# Patient Record
Sex: Female | Born: 1935 | Race: White | Hispanic: No | Marital: Married | Smoking: Never smoker
Health system: Southern US, Community
[De-identification: ages and names within clinical notes are randomized; demographics above are authoritative.]

## PROBLEM LIST (undated history)

## (undated) DIAGNOSIS — N390 Urinary tract infection, site not specified: Secondary | ICD-10-CM

## (undated) DIAGNOSIS — I10 Essential (primary) hypertension: Secondary | ICD-10-CM

## (undated) DIAGNOSIS — N189 Chronic kidney disease, unspecified: Secondary | ICD-10-CM

## (undated) HISTORY — PX: CHOLECYSTECTOMY: SHX55

## (undated) HISTORY — PX: ORTHOPEDIC SURGERY: SHX850

## (undated) HISTORY — PX: ABDOMINAL HYSTERECTOMY: SHX81

---

## 2014-12-13 ENCOUNTER — Emergency Department (HOSPITAL_COMMUNITY): Payer: Medicare Other

## 2014-12-13 ENCOUNTER — Encounter (HOSPITAL_COMMUNITY): Payer: Self-pay | Admitting: *Deleted

## 2014-12-13 ENCOUNTER — Emergency Department (HOSPITAL_COMMUNITY)
Admission: EM | Admit: 2014-12-13 | Discharge: 2014-12-13 | Disposition: A | Discharge status: Home / Self Care | Payer: Medicare Other | Attending: Emergency Medicine | Admitting: Emergency Medicine

## 2014-12-13 DIAGNOSIS — W109XXA Fall (on) (from) unspecified stairs and steps, initial encounter: Secondary | ICD-10-CM | POA: Diagnosis not present

## 2014-12-13 DIAGNOSIS — Z87440 Personal history of urinary (tract) infections: Secondary | ICD-10-CM | POA: Insufficient documentation

## 2014-12-13 DIAGNOSIS — S2232XA Fracture of one rib, left side, initial encounter for closed fracture: Secondary | ICD-10-CM

## 2014-12-13 DIAGNOSIS — Y9289 Other specified places as the place of occurrence of the external cause: Secondary | ICD-10-CM | POA: Diagnosis not present

## 2014-12-13 DIAGNOSIS — W19XXXA Unspecified fall, initial encounter: Secondary | ICD-10-CM

## 2014-12-13 DIAGNOSIS — Y998 Other external cause status: Secondary | ICD-10-CM | POA: Diagnosis not present

## 2014-12-13 DIAGNOSIS — S42001A Fracture of unspecified part of right clavicle, initial encounter for closed fracture: Secondary | ICD-10-CM | POA: Insufficient documentation

## 2014-12-13 DIAGNOSIS — S42002A Fracture of unspecified part of left clavicle, initial encounter for closed fracture: Secondary | ICD-10-CM

## 2014-12-13 DIAGNOSIS — Y9389 Activity, other specified: Secondary | ICD-10-CM | POA: Diagnosis not present

## 2014-12-13 DIAGNOSIS — S0083XA Contusion of other part of head, initial encounter: Secondary | ICD-10-CM | POA: Insufficient documentation

## 2014-12-13 DIAGNOSIS — Z880 Allergy status to penicillin: Secondary | ICD-10-CM | POA: Diagnosis not present

## 2014-12-13 DIAGNOSIS — S0990XA Unspecified injury of head, initial encounter: Secondary | ICD-10-CM | POA: Diagnosis present

## 2014-12-13 DIAGNOSIS — S2242XA Multiple fractures of ribs, left side, initial encounter for closed fracture: Secondary | ICD-10-CM | POA: Diagnosis not present

## 2014-12-13 DIAGNOSIS — M25512 Pain in left shoulder: Secondary | ICD-10-CM | POA: Insufficient documentation

## 2014-12-13 DIAGNOSIS — I10 Essential (primary) hypertension: Secondary | ICD-10-CM | POA: Diagnosis not present

## 2014-12-13 DIAGNOSIS — Z791 Long term (current) use of non-steroidal anti-inflammatories (NSAID): Secondary | ICD-10-CM | POA: Insufficient documentation

## 2014-12-13 DIAGNOSIS — Z79899 Other long term (current) drug therapy: Secondary | ICD-10-CM | POA: Diagnosis not present

## 2014-12-13 DIAGNOSIS — S0093XA Contusion of unspecified part of head, initial encounter: Secondary | ICD-10-CM

## 2014-12-13 HISTORY — DX: Essential (primary) hypertension: I10

## 2014-12-13 HISTORY — DX: Urinary tract infection, site not specified: N39.0

## 2014-12-13 MED ORDER — HYDROCODONE-ACETAMINOPHEN 5-325 MG PO TABS: 1.0000 | ORAL_TABLET | ORAL | Status: DC | PRN

## 2014-12-13 MED ORDER — ONDANSETRON HCL 4 MG/2ML IJ SOLN
4.0000 mg | Freq: Once | INTRAMUSCULAR | Status: AC
  Administered 2014-12-13: 4 mg via INTRAMUSCULAR
  Filled 2014-12-13: qty 2

## 2014-12-13 MED ORDER — MORPHINE SULFATE 2 MG/ML IJ SOLN
2.0000 mg | INTRAMUSCULAR | Status: DC | PRN
  Administered 2014-12-13: 2 mg via INTRAVENOUS
  Filled 2014-12-13: qty 1

## 2014-12-13 MED ORDER — MORPHINE SULFATE 4 MG/ML IJ SOLN
4.0000 mg | Freq: Once | INTRAMUSCULAR | Status: AC
  Administered 2014-12-13: 4 mg via INTRAVENOUS
  Filled 2014-12-13: qty 1

## 2014-12-13 NOTE — ED Notes (Signed)
Patient refused sling per Ortho Tech.

## 2014-12-13 NOTE — ED Provider Notes (Signed)
CSN: [id number]     Arrival date & time 12/13/14  1643 History   First MD Initiated Contact with Patient 12/13/14 1656     Chief Complaint  Patient presents with  . Fall     (Consider location/radiation/quality/duration/timing/severity/associated sxs/prior Treatment) Patient is a 81 y.o. female presenting with fall. The history is provided by the patient. No language interpreter was used.  Fall This is a new problem. The current episode started today. The problem has been unchanged. Associated symptoms include headaches, joint swelling and myalgias. Pertinent negatives include no abdominal pain or neck pain. Nothing aggravates the symptoms. She has tried nothing for the symptoms. The treatment provided moderate relief.  Pt complains of a headache pain to her left shoulder and her left elbow.  Pt reports she did not lose conciousness.   Pt reports she had a fracture to her right shoulder several weeks ago. (family reports frequent falls, Pt has a walker and a wheelchair but refuses to use.      Past Medical History  Diagnosis Date  . Hypertension   . UTI (lower urinary tract infection)    History reviewed. No pertinent past surgical history. History reviewed. No pertinent family history. History  Substance Use Topics  . Smoking status: Not on file  . Smokeless tobacco: Not on file  . Alcohol Use: No   OB History    No data available     Review of Systems  Gastrointestinal: Negative for abdominal pain.  Musculoskeletal: Positive for myalgias and joint swelling. Negative for neck pain.  Neurological: Positive for headaches.  All other systems reviewed and are negative.     Allergies  Clinoril; Darvon; Levaquin; Penicillins; and Sulfa antibiotics  Home Medications   Prior to Admission medications   Medication Sig Start Date End Date Taking? Authorizing Provider  Cholecalciferol (VITAMIN D PO) Take 1 tablet by mouth at bedtime.   Yes Historical Provider, MD   HYDROcodone-acetaminophen (NORCO/VICODIN) 5-325 MG per tablet Take 1 tablet by mouth every 4 (four) hours as needed for moderate pain or severe pain. 12/13/14   Fransico Meadow, PA-C  ibuprofen (ADVIL,MOTRIN) 200 MG tablet Take 400 mg by mouth 2 (two) times daily.   Yes Historical Provider, MD  metoprolol succinate (TOPROL-XL) 25 MG 24 hr tablet Take 25 mg by mouth at bedtime.  10/31/14  Yes Historical Provider, MD  nitrofurantoin (MACRODANTIN) 50 MG capsule Take 50 mg by mouth at bedtime. Continuous course 12/01/14  Yes Historical Provider, MD   BP 146/67 mmHg  Pulse 83  Temp(Src) 98.1 F (36.7 C) (Oral)  Resp 16  SpO2 91% Physical Exam  Constitutional: She is oriented to person, place, and time. She appears well-developed and well-nourished.  HENT:  Head: Normocephalic and atraumatic.  Eyes: Conjunctivae and EOM are normal.  Neck: Normal range of motion.  Cardiovascular: Normal rate and normal heart sounds.   Pulmonary/Chest: Effort normal.  Abdominal: Soft. She exhibits no distension.  Musculoskeletal: Normal range of motion.  Neurological: She is alert and oriented to person, place, and time.  Skin: Skin is warm.  Psychiatric: She has a normal mood and affect.  Nursing note and vitals reviewed.   ED Course  Procedures (including critical care time) Labs Review Labs Reviewed - No data to display  Imaging Review Dg Ribs Unilateral W/chest Left  12/13/2014   CLINICAL DATA:  Initial encounter for patient fall with injury to left-sided chest. Left chest pain.  EXAM: LEFT RIBS AND CHEST - 3+ VIEW  COMPARISON:  None.  FINDINGS: Frontal chest shows hyperexpansion with underlying chronic interstitial coarsening. There is right paratracheal opacity that may be related to ectasia of large vessel anatomy or thyroid enlargement, but medial right upper lobe lung lesion cannot be excluded. There is probably some scarring in the left base although infection could have this appearance. The  cardiopericardial silhouette is within normal limits for size. Bones are diffusely demineralized.  The oblique films show fractures of the left third, fourth, fifth, and sixth ribs.  IMPRESSION: 1. Opacity in the medial right upper lung may be related to vascular anatomy or enlarged thyroid gland, but a lung lesion can't have this appearance. CT chest without contrast recommended to further evaluate. 2. Opacity at the left lung base is probably related to bronchiectasis and scarring. This could also be further assessed at the time of CT. 3. Acute fractures of the left third through sixth ribs.   Electronically Signed   By: Misty Stanley M.D.   On: 12/13/2014 20:03   Dg Elbow Complete Left  12/13/2014   CLINICAL DATA:  Acute left elbow pain after fall today.  EXAM: LEFT ELBOW - COMPLETE 3+ VIEW  COMPARISON:  None.  FINDINGS: There is no evidence of fracture, dislocation, or joint effusion. There is no evidence of arthropathy or other focal bone abnormality. Soft tissues are unremarkable.  IMPRESSION: Normal left elbow.   Electronically Signed   By: Sabino Dick M.D.   On: 12/13/2014 18:34   Ct Head Wo Contrast  12/13/2014   CLINICAL DATA:  Headache and 4 head injury after fall today.  EXAM: CT HEAD WITHOUT CONTRAST  CT CERVICAL SPINE WITHOUT CONTRAST  TECHNIQUE: Multidetector CT imaging of the head and cervical spine was performed following the standard protocol without intravenous contrast. Multiplanar CT image reconstructions of the cervical spine were also generated.  COMPARISON:  None.  FINDINGS: CT HEAD FINDINGS  Bony calvarium appears intact. Large left parietal scalp hematoma is noted. Mild diffuse cortical atrophy is noted. Mild chronic ischemic white matter disease is noted. No mass effect or midline shift is noted. Ventricular size is within normal limits. There is no evidence of mass lesion, hemorrhage or acute infarction.  CT CERVICAL SPINE FINDINGS  No significant abnormality seen in the visualized  lung apices. No fracture is noted. Grade 1 anterolisthesis of C4-5 is noted secondary to posterior facet joint hypertrophy. Moderate degenerative disc disease is noted at C5-6 and C6-7 with anterior osteophyte formation. Mild hypertrophy of posterior facet joints is noted.  IMPRESSION: Large left parietal scalp hematoma. Mild diffuse cortical atrophy. Mild chronic ischemic white matter disease. No acute intracranial abnormality seen.  Moderate degenerative disc disease is noted at C5-6 and C6-7. No acute abnormality seen in the cervical spine.   Electronically Signed   By: Sabino Dick M.D.   On: 12/13/2014 19:06   Ct Chest Wo Contrast  12/13/2014   CLINICAL DATA:  Fall to the left side. Left shoulder pain. Chest radiograph suggested possible lesion in the right upper lung.  EXAM: CT CHEST WITHOUT CONTRAST  TECHNIQUE: Multidetector CT imaging of the chest was performed following the standard protocol without IV contrast.  COMPARISON:  Chest and ribs 12/13/2014  FINDINGS: Normal heart size. Coronary artery calcifications. Normal caliber thoracic aorta with calcification. No significant lymphadenopathy in the chest. Esophagus is decompressed. The unenhanced mediastinal structures are otherwise unremarkable.  Patchy areas of infiltration with tree and bud appearance demonstrated in the right upper lung, right middle lung, left  lingula, and left lower lung. This is most consistent with bronchitis or bronchiolitis. Cylindrical bronchiectasis demonstrated in the left lower lung and left lingula with minimal bronchiectasis in the right middle lobe. Mucous plugging in the left lung base. The hazy opacity demonstrated on chest radiograph in the right upper lung corresponds to a focal area of infiltration. No lung mass is demonstrated. No pneumothorax. No pleural effusions.  Included portions of the upper abdominal organs demonstrate surgical absence of the gallbladder. Vascular calcifications. Probable cyst in the upper  pole left kidney. The degenerative changes in the thoracic spine. The wedge compression deformities of mid thoracic vertebrae. These are associated with degenerative changes in her likely old low bone no prior comparison studies are available for confirmation. No destructive bone lesions are appreciated. The old fracture deformity of the left shoulder. Acute nondisplaced fracture of the distal left clavicle. Acute fracture of the lateral left third rib. Probable fracture of the left fourth rib.  IMPRESSION: Patchy tree-in-bud infiltrates in both lungs suggesting bronchitis/ bronchiolitis. Chronic bronchiectasis the in the left lingula, left lower lobe, and right middle lobe with mucous plugging in the left base. And acute fractures of the distal left clavicle and of the left lateral third and fourth ribs. Degenerative changes in the thoracic spine with wedge compression of mid thoracic vertebrae, likely chronic. Old fracture deformity of the left shoulder.   Electronically Signed   By: Lucienne Capers M.D.   On: 12/13/2014 21:15   Ct Cervical Spine Wo Contrast  12/13/2014   CLINICAL DATA:  Headache and 4 head injury after fall today.  EXAM: CT HEAD WITHOUT CONTRAST  CT CERVICAL SPINE WITHOUT CONTRAST  TECHNIQUE: Multidetector CT imaging of the head and cervical spine was performed following the standard protocol without intravenous contrast. Multiplanar CT image reconstructions of the cervical spine were also generated.  COMPARISON:  None.  FINDINGS: CT HEAD FINDINGS  Bony calvarium appears intact. Large left parietal scalp hematoma is noted. Mild diffuse cortical atrophy is noted. Mild chronic ischemic white matter disease is noted. No mass effect or midline shift is noted. Ventricular size is within normal limits. There is no evidence of mass lesion, hemorrhage or acute infarction.  CT CERVICAL SPINE FINDINGS  No significant abnormality seen in the visualized lung apices. No fracture is noted. Grade 1  anterolisthesis of C4-5 is noted secondary to posterior facet joint hypertrophy. Moderate degenerative disc disease is noted at C5-6 and C6-7 with anterior osteophyte formation. Mild hypertrophy of posterior facet joints is noted.  IMPRESSION: Large left parietal scalp hematoma. Mild diffuse cortical atrophy. Mild chronic ischemic white matter disease. No acute intracranial abnormality seen.  Moderate degenerative disc disease is noted at C5-6 and C6-7. No acute abnormality seen in the cervical spine.   Electronically Signed   By: Sabino Dick M.D.   On: 12/13/2014 19:06   Dg Shoulder Left  12/13/2014   CLINICAL DATA:  Initial encounter for fall earlier today with left shoulder pain.  EXAM: LEFT SHOULDER - 2+ VIEW  COMPARISON:  None.  FINDINGS: Two view exam of the left shoulder was performed. Bones are diffusely demineralized. Deformity of the left humeral head may be related to previous trauma. No acute fracture is evident. Acromioclavicular cortical clavicular distance is preserved.  There is an acute fracture of the lateral left third rib.  IMPRESSION: Left third rib fracture appears acute.  No evidence for acute shoulder abnormality.   Electronically Signed   By: Verda Cumins.D.  On: 12/13/2014 18:33     EKG Interpretation None      MDM  Pt able to ambulate with a walker and with her cane.  Pt advised to use walker.  Pt refuses sling for fracture.  Pt reports she wore one before and it makes it worse   Final diagnoses:  Fall  Clavicle fracture, left, closed, initial encounter  Rib fractures, left, closed, initial encounter  Contusion of head, initial encounter    Pt advised to see her MD for recheck Pt is visiting here from out of town.  Hydrocodone one every 4-6 when resting, do not take and walk.     Hollace Kinnier [place], PA-C 12/14/14 0009  Dot Lanes, MD 12/18/14 [phone]

## 2014-12-13 NOTE — Progress Notes (Signed)
Orthopedic Tech Progress Note Patient Details:  Shannon Joyce 12-Oct-1925 [id number]  Ortho Devices Type of Ortho Device: Arm sling Ortho Device/Splint Interventions: Application   [name] 12/13/2014, 11:30 PM

## 2014-12-13 NOTE — ED Notes (Signed)
Pt returned to room  

## 2014-12-13 NOTE — ED Notes (Signed)
Pt. Still at scans

## 2014-12-13 NOTE — Discharge Instructions (Signed)
Acromioclavicular Injuries °The AC (acromioclavicular) joint is the joint in the shoulder where the collarbone (clavicle) meets the shoulder blade (scapula). The part of the shoulder blade connected to the collarbone is called the acromion. Common problems with and treatments for the AC joint are detailed below. °ARTHRITIS °Arthritis occurs when the joint has been injured and the smooth padding between the joints (cartilage) is lost. This is the wear and tear seen in most joints of the body if they have been overused. This causes the joint to produce pain and swelling which is worse with activity.  °AC JOINT SEPARATION °AC joint separation means that the ligaments connecting the acromion of the shoulder blade and collarbone have been damaged, and the two bones no longer line up. AC separations can be anywhere from mild to severe, and are "graded" depending upon which ligaments are torn and how badly they are torn. °· Grade I Injury: the least damage is done, and the AC joint still lines up. °· Grade II Injury: damage to the ligaments which reinforce the AC joint. In a Grade II injury, these ligaments are stretched but not entirely torn. When stressed, the AC joint becomes painful and unstable. °· Grade III Injury: AC and secondary ligaments are completely torn, and the collarbone is no longer attached to the shoulder blade. This results in deformity; a prominence of the end of the clavicle. °AC JOINT FRACTURE °AC joint fracture means that there has been a break in the bones of the AC joint, usually the end of the clavicle. °TREATMENT °TREATMENT OF AC ARTHRITIS °· There is currently no way to replace the cartilage damaged by arthritis. The best way to improve the condition is to decrease the activities which aggravate the problem. Application of ice to the joint helps decrease pain and soreness (inflammation). The use of non-steroidal anti-inflammatory medication is helpful. °· If less conservative measures do not  work, then cortisone shots (injections) may be used. These are anti-inflammatories; they decrease the soreness in the joint and swelling. °· If non-surgical measures fail, surgery may be recommended. The procedure is generally removal of a portion of the end of the clavicle. This is the part of the collarbone closest to your acromion which is stabilized with ligaments to the acromion of the shoulder blade. This surgery may be performed using a tube-like instrument with a light (arthroscope) for looking into a joint. It may also be performed as an open surgery through a small incision by the surgeon. Most patients will have good range of motion within 6 weeks and may return to all activity including sports by 8-12 weeks, barring complications. °TREATMENT OF AN AC SEPARATION °· The initial treatment is to decrease pain. This is best accomplished by immobilizing the arm in a sling and placing an ice pack to the shoulder for 20 to 30 minutes every 2 hours as needed. As the pain starts to subside, it is important to begin moving the fingers, wrist, elbow and eventually the shoulder in order to prevent a stiff or "frozen" shoulder. Instruction on when and how much to move the shoulder will be provided by your caregiver. The length of time needed to regain full motion and function depends on the amount or grade of the injury. Recovery from a Grade I AC separation usually takes 10 to 14 days, whereas a Grade III may take 6 to 8 weeks. °· Grade I and II separations usually do not require surgery. Even Grade III injuries usually allow return to full   activity with few restrictions. Treatment is also based on the activity demands of the injured shoulder. For example, a high level quarterback with an injured throwing arm will receive more aggressive treatment than someone with a desk job who rarely uses his/her arm for strenuous activities. In some cases, a painful lump may persist which could require a later surgery. Surgery  can be very successful, but the benefits must be weighed against the potential risks. TREATMENT OF AN AC JOINT FRACTURE Fracture treatment depends on the type of fracture. Sometimes a splint or sling may be all that is required. Other times surgery may be required for repair. This is more frequently the case when the ligaments supporting the clavicle are completely torn. Your caregiver will help you with these decisions and together you can decide what will be the best treatment. HOME CARE INSTRUCTIONS   Apply ice to the injury for 15-20 minutes each hour while awake for 2 days. Put the ice in a plastic bag and place a towel between the bag of ice and skin.  If a sling has been applied, wear it constantly for as long as directed by your caregiver, even at night. The sling or splint can be removed for bathing or showering or as directed. Be sure to keep the shoulder in the same place as when the sling is on. Do not lift the arm.  If a figure-of-eight splint has been applied it should be tightened gently by another person every day. Tighten it enough to keep the shoulders held back. Allow enough room to place the index finger between the body and strap. Loosen the splint immediately if there is numbness or tingling in the hands.  Take over-the-counter or prescription medicines for pain, discomfort or fever as directed by your caregiver.  If you or your child has received a follow up appointment, it is very important to keep that appointment in order to avoid long term complications, chronic pain or disability. SEEK MEDICAL CARE IF:   The pain is not relieved with medications.  There is increased swelling or discoloration that continues to get worse rather than better.  You or your child has been unable to follow up as instructed.  There is progressive numbness and tingling in the arm, forearm or hand. SEEK IMMEDIATE MEDICAL CARE IF:   The arm is numb, cold or pale.  There is increasing pain  in the hand, forearm or fingers. MAKE SURE YOU:   Understand these instructions.  Will watch your condition.  Will get help right away if you are not doing well or get worse. Document Released: 09/07/2005 Document Revised: 02/20/2012 Document Reviewed: 03/02/2009 [facility] Patient Information 2015 [place], [place]. This information is not intended to replace advice given to you by your health care provider. Make sure you discuss any questions you have with your health care provider. Rib Fracture A rib fracture is a break or crack in one of the bones of the ribs. The ribs are a group of long, curved bones that wrap around your chest and attach to your spine. They protect your lungs and other organs in the chest cavity. A broken or cracked rib is often painful, but most do not cause other problems. Most rib fractures heal on their own over time. However, rib fractures can be more serious if multiple ribs are broken or if broken ribs move out of place and push against other structures. CAUSES   A direct blow to the chest. For example, this could happen  during contact sports, a car accident, or a fall against a hard object.  Repetitive movements with high force, such as pitching a baseball or having severe coughing spells. SYMPTOMS   Pain when you breathe in or cough.  Pain when someone presses on the injured area. DIAGNOSIS  Your caregiver will perform a physical exam. Various imaging tests may be ordered to confirm the diagnosis and to look for related injuries. These tests may include a chest X-ray, computed tomography (CT), magnetic resonance imaging (MRI), or a bone scan. TREATMENT  Rib fractures usually heal on their own in 1-3 months. The longer healing period is often associated with a continued cough or other aggravating activities. During the healing period, pain control is very important. Medication is usually given to control pain. Hospitalization or surgery may be needed for more  severe injuries, such as those in which multiple ribs are broken or the ribs have moved out of place.  HOME CARE INSTRUCTIONS   Avoid strenuous activity and any activities or movements that cause pain. Be careful during activities and avoid bumping the injured rib.  Gradually increase activity as directed by your caregiver.  Only take over-the-counter or prescription medications as directed by your caregiver. Do not take other medications without asking your caregiver first.  Apply ice to the injured area for the first 1-2 days after you have been treated or as directed by your caregiver. Applying ice helps to reduce inflammation and pain.  Put ice in a plastic bag.  Place a towel between your skin and the bag.   Leave the ice on for 15-20 minutes at a time, every 2 hours while you are awake.  Perform deep breathing as directed by your caregiver. This will help prevent pneumonia, which is a common complication of a broken rib. Your caregiver may instruct you to:  Take deep breaths several times a day.  Try to cough several times a day, holding a pillow against the injured area.  Use a device called an incentive spirometer to practice deep breathing several times a day.  Drink enough fluids to keep your urine clear or pale yellow. This will help you avoid constipation.   Do not wear a rib belt or binder. These restrict breathing, which can lead to pneumonia.  SEEK IMMEDIATE MEDICAL CARE IF:   You have a fever.   You have difficulty breathing or shortness of breath.   You develop a continual cough, or you cough up thick or bloody sputum.  You feel sick to your stomach (nausea), throw up (vomit), or have abdominal pain.   You have worsening pain not controlled with medications.  MAKE SURE YOU:  Understand these instructions.  Will watch your condition.  Will get help right away if you are not doing well or get worse. Document Released: 11/28/2005 Document Revised:  07/31/2013 Document Reviewed: 01/30/2013 [facility] Patient Information 2015 [place], [place]. This information is not intended to replace advice given to you by your health care provider. Make sure you discuss any questions you have with your health care provider.

## 2014-12-13 NOTE — ED Notes (Signed)
Pt reports falling approx 1 hour ago, fell down 2 steps and hit her head, unsure if loc occurred. Pt has large hematoma to left side of head and reports pain to bilateral shoulders. Has hx of left clavicle fx 3 months ago.

## 2018-06-18 ENCOUNTER — Encounter: Payer: Self-pay | Admitting: Internal Medicine

## 2018-06-18 ENCOUNTER — Non-Acute Institutional Stay: Payer: Medicare Other | Admitting: Internal Medicine

## 2018-06-18 VITALS — BP 158/80 | HR 91 | Resp 20

## 2018-06-18 DIAGNOSIS — R531 Weakness: Secondary | ICD-10-CM

## 2018-06-18 DIAGNOSIS — Z515 Encounter for palliative care: Secondary | ICD-10-CM

## 2018-06-18 DIAGNOSIS — R2689 Other abnormalities of gait and mobility: Secondary | ICD-10-CM

## 2018-06-18 NOTE — Progress Notes (Signed)
   06/18/2018  PALLIATIVE CARE ENCOUNTER Shannon Joyce. DOB: 14-May-1925 . [facility] Room 8-B REFERRING PROVIDER:  Jaynee Joyce  FAMILY: two sons Shannon Joyce [facility]), Shannon Joyce [phone]; patient believes Shannon Joyce is [place].Marland Kitchen  BILLABLE  ICD-10: Palliative Care   IMPRESSION/RECOMMENDATIONS: 1. Mobility disorder: -Mostly wheelchair bound; maneuvers about quite well in her wheelchair; able to sit up and transfer independently. Can propell self about by tuning the wheels or using her feet. Physical Therapy ambulates with patient (with walker) 1/2 down hallway a few times month.  2. Pain: -Notes pain left knee with weight bearing; favors right leg because of thigh pain. Feels well managed with MPAP 650mg  tid.    3. Weight loss:  -In Jan 2019 serum Alb 2.8. Fair appetite. Consumes 25-50% of three meals a day; Ensure 1-2 cans/day.  Weight: 98 lbs, which is a loss of 8 lbs over 7 months (which is a loss of 7.5% of body mass). Ht 5'" BMI 19.1 kg/m2. On Remeron 7.5mg  for appetite enhancement.  3. Advanced Care Planning: Staff report that patient is a DNR.  I spent 45 minutes providing this consultation (from 1:45pm to 2:30pm). More than 50% of that time was spent coordinating communication.  HPI: 84 yo female resident of [facility], referred to [facility] for assistance with symptom management, help with coordination of resources, and ongoing discussions regarding goals of care/advanced directives.  CODE STATUS: DNR per staff report. PPS: 40%. HOSPICE ELIGIBILITY: No  MEDICATIONS :   Maalox 20mg  q4h prn, Tums 2 tabs qid, Voltaren gel bid prn, MTV qd, furosemide 20mg  qd, Toprol-XL 25 mg qd, Senna S 8.6-50 mg qd, trimethoprim (Trimpex) 50mg  qd,  MTV. prilosec 20mg  qd, MAPAP 650 tid,  Ensure tid, mirtazapine 7.5mg  qd ALLERGIES: Clinorl, Darvon, Levaquin, penicilllins, sulfa antibiotics.  PAST MEDICAL HISTORY: Sepsis r/t UTI (ESBL), AKI on CKD 3b (12/2017: BUN 35,  Creat: 1.51),  GERD, HTN, recurrent UTI, nephrolithiasis, confusion setting of UTI, femur fracture (earlier this year), sacral pressure injury 01/04-01/09 Hosp admission @ [facility]; treated for sepsis r/t UTI.  SH/H: Husband of 44 years deceased earlier this year; lost her daughter 5 years ago.  PHYSICAL EXAM: Elderly female who was able to self transport from bed to wheelchair to recliner, then to bathroom. Alert and pleasantly conversant.  VS: BP: 158/80, HRL 91, Sats 95% (room air) RR 20 HEENT: [place], AT LUNGS:  Mild congested cough (recent onset), LSCTA CARDIAC: RRR without MRG ABD: soft, non-distended, non-tender, NABS EXTREMITIES: No pedal edema MUSCULOSKELETAL: No contractures SKIN: Skin warm, dry and intact NEURO: A & O x 3.  Violeta Gelinas NP-C

## 2018-09-10 ENCOUNTER — Non-Acute Institutional Stay: Payer: Medicare Other | Admitting: Nurse Practitioner

## 2018-09-10 ENCOUNTER — Encounter: Payer: Self-pay | Admitting: Nurse Practitioner

## 2018-09-10 VITALS — BP 140/72 | HR 72 | Resp 18 | Wt 103.0 lb

## 2018-09-10 DIAGNOSIS — G894 Chronic pain syndrome: Secondary | ICD-10-CM

## 2018-09-10 DIAGNOSIS — R269 Unspecified abnormalities of gait and mobility: Secondary | ICD-10-CM

## 2018-09-10 DIAGNOSIS — R634 Abnormal weight loss: Secondary | ICD-10-CM

## 2018-09-10 DIAGNOSIS — Z515 Encounter for palliative care: Secondary | ICD-10-CM

## 2018-09-10 NOTE — Progress Notes (Signed)
    PALLIATIVE CARE CONSULT VISIT   PATIENT NAME: Shannon Joyce DOB: September 08, 1925 MRN: [id number]  PRIMARY CARE PROVIDER:   No primary care provider on file.  REFERRING PROVIDER:  No referring provider defined for this encounter.  RESPONSIBLE PARTY:   Roselee Culver (spouse)    HISTORY OF PRESENT ILLNESS:  Shannon Joyce is a 84 y.o. year old female with multiple medical problems including weight loss, HTN, right femur fracture (01/19), chronic pain, HTN, GERD and recurrent UTI's. Palliative Care was asked to help with symptom management and to  address goals of care    Problem/RECOMMENDATIONS and PLAN:  Weight loss (improved) -weight now 103lbs (98lb);patient reports good apetite -continue mirtazapine 7.5mg  qd -continue ensure tid  Chronic medical problems H/o Rt. femur fracture Chronic pain Gait disorder HTN GERD Recurrent UTI's  -Managed by primary care provider -denies any current pain -PRN norco 5/325 Q4hrs -ibuprofen 400mg  BID -patient able to transfer herself from bed to wheelchair -metoprolol 25mg  Qhs -nitrofurantioin 50mg  at bedtime  ACP -DNR   I spent 30 minutes providing this consultation,  from 14:00 to 14:30. More than 50% of the time in this consultation was spent coordinating communication.   . CODE STATUS: DNR  PPS: 40% HOSPICE ELIGIBILITY/DIAGNOSIS: TBD  PAST MEDICAL HISTORY:  Past Medical History:  Diagnosis Date  . Hypertension   . UTI (lower urinary tract infection)     SOCIAL HX:  Social History   Tobacco Use  . Smoking status: Not on file  Substance Use Topics  . Alcohol use: No    ALLERGIES:  Allergies  Allergen Reactions  . Clinoril [Sulindac] Other (See Comments)    Hives or swelling  . Darvon [Propoxyphene] Other (See Comments)    Hives or swelling  . Levaquin [Levofloxacin In D5w] Swelling  . Penicillins Other (See Comments)    Hives or swelling  . Sulfa Antibiotics Other (See Comments)    Hives or swelling     PERTINENT  MEDICATIONS:  Outpatient Encounter Medications as of 09/10/2018  Medication Sig  . Cholecalciferol (VITAMIN D PO) Take 1 tablet by mouth at bedtime.  Marland Kitchen HYDROcodone-acetaminophen (NORCO/VICODIN) 5-325 MG per tablet Take 1 tablet by mouth every 4 (four) hours as needed for moderate pain or severe pain.  Marland Kitchen ibuprofen (ADVIL,MOTRIN) 200 MG tablet Take 400 mg by mouth 2 (two) times daily.  . metoprolol succinate (TOPROL-XL) 25 MG 24 hr tablet Take 25 mg by mouth at bedtime.   . nitrofurantoin (MACRODANTIN) 50 MG capsule Take 50 mg by mouth at bedtime. Continuous course   No facility-administered encounter medications on file as of 09/10/2018.    ROS: denies fever,chills, cough,shortness of breath, chest pain, abdominal pain, denies knee pain says PRN pain meds are effective, denies dysuria, occasional constipation; reports generalized fatigue (that is chronic)  PHYSICAL EXAM:   General: NAD, frail appearing, thin Cardiovascular: regular rate and rhythm Pulmonary: clear ant fields Abdomen: soft, nontender, + bowel sounds GU: no suprapubic tenderness Extremities: no edema, no joint deformities Skin: no rashes Neurological: Weakness but otherwise nonfocal  Stephanie G Martinique, NP

## 2018-11-19 ENCOUNTER — Non-Acute Institutional Stay: Payer: Medicare Other | Admitting: Nurse Practitioner

## 2018-11-19 DIAGNOSIS — G894 Chronic pain syndrome: Secondary | ICD-10-CM

## 2018-11-19 DIAGNOSIS — F0390 Unspecified dementia without behavioral disturbance: Secondary | ICD-10-CM

## 2018-11-19 DIAGNOSIS — Z515 Encounter for palliative care: Secondary | ICD-10-CM

## 2018-11-19 DIAGNOSIS — R531 Weakness: Secondary | ICD-10-CM

## 2018-11-19 DIAGNOSIS — R269 Unspecified abnormalities of gait and mobility: Secondary | ICD-10-CM

## 2018-11-20 ENCOUNTER — Encounter: Payer: Self-pay | Admitting: Nurse Practitioner

## 2018-11-20 NOTE — Progress Notes (Signed)
    PALLIATIVE CARE CONSULT VISIT   PATIENT NAME: Shannon Joyce DOB: 03-Jun-1925 MRN: [id number]  PRIMARY CARE PROVIDER:   Patient, No Pcp Per  REFERRING PROVIDER:  No referring provider defined for this encounter.  RESPONSIBLE PARTY:   Roselee Culver (spouse)    HISTORY OF PRESENT ILLNESS:  Shannon Joyce is a 84 y.o. year old female with multiple medical problems including weight loss, HTN, right femur fracture (01/19), chronic pain, HTN, GERD and recurrent UTI's. Palliative Care was asked to help with symptom management and to  address goals of care  Interim history: patient maintaining weight; patient denies any pain or discomfort    Problem/RECOMMENDATIONS and PLAN:  Weight loss (improved) -patient reports good appetite -continue mirtazapine 7.5mg  qd -continue ensure tid  Chronic medical problems H/o Rt. femur fracture Chronic pain Gait disorder HTN GERD Recurrent UTI's  -Managed by primary care provider -denies any current pain -PRN norco 5/325 Q4hrs -ibuprofen 400mg  BID -patient able to transfer herself from bed to wheelchair -metoprolol 25mg  Qhs -nitrofurantioin 50mg  at bedtime  ACP -DNR   I spent 15 minutes providing this consultation,  from 14:30 to 14:45. More than 50% of the time in this consultation was spent coordinating communication.   CODE STATUS: DNR  PPS: 40% HOSPICE ELIGIBILITY/DIAGNOSIS: TBD  PAST MEDICAL HISTORY:  Past Medical History:  Diagnosis Date  . Hypertension   . UTI (lower urinary tract infection)     SOCIAL HX:  Social History   Tobacco Use  . Smoking status: Never Smoker  . Smokeless tobacco: Never Used  Substance Use Topics  . Alcohol use: No    ALLERGIES:  Allergies  Allergen Reactions  . Clinoril [Sulindac] Other (See Comments)    Hives or swelling  . Darvon [Propoxyphene] Other (See Comments)    Hives or swelling  . Levaquin [Levofloxacin In D5w] Swelling  . Penicillins Other (See Comments)    Hives or swelling  .  Sulfa Antibiotics Other (See Comments)    Hives or swelling     PERTINENT MEDICATIONS:  Outpatient Encounter Medications as of 11/19/2018  Medication Sig  . Cholecalciferol (VITAMIN D PO) Take 1 tablet by mouth at bedtime.  Marland Kitchen HYDROcodone-acetaminophen (NORCO/VICODIN) 5-325 MG per tablet Take 1 tablet by mouth every 4 (four) hours as needed for moderate pain or severe pain.  Marland Kitchen ibuprofen (ADVIL,MOTRIN) 200 MG tablet Take 400 mg by mouth 2 (two) times daily.  . metoprolol succinate (TOPROL-XL) 25 MG 24 hr tablet Take 25 mg by mouth at bedtime.   . nitrofurantoin (MACRODANTIN) 50 MG capsule Take 50 mg by mouth at bedtime. Continuous course   No facility-administered encounter medications on file as of 11/19/2018.    ROS: denies fever,chills, cough,shortness of breath, chest pain, abdominal pain, denies knee pain says PRN pain meds are effective, denies dysuria, occasional constipation; reports generalized fatigue (that is chronic)  PHYSICAL EXAM:   General: NAD, well-developed, well-nourished Cardiovascular: regular rate and rhythm Pulmonary: clear ant fields Abdomen: soft, nontender, + bowel sounds GU: no suprapubic tenderness Extremities: no edema, no joint deformities Skin: no rashes Neurological: Weakness but otherwise nonfocal  Domani Bakos G Martinique, NP

## 2019-02-08 ENCOUNTER — Telehealth: Payer: Self-pay | Admitting: Adult Health Nurse Practitioner

## 2019-02-08 ENCOUNTER — Non-Acute Institutional Stay: Payer: Medicare Other | Admitting: Adult Health Nurse Practitioner

## 2019-02-08 DIAGNOSIS — Z515 Encounter for palliative care: Secondary | ICD-10-CM

## 2019-02-08 NOTE — Progress Notes (Signed)
    [facility] Consult Note Telephone: [phone]  Fax: [phone]  PATIENT NAME: Shannon Joyce DOB: Sep 10, 1925 MRN: [id number]  PRIMARY CARE PROVIDER:   Patient, No Pcp Per  REFERRING PROVIDER:  No referring provider defined for this encounter.  RESPONSIBLE PARTY:   Muntaha Vermette (spouse) [phone]       RECOMMENDATIONS and PLAN:  1.  Weight loss.  Patient's weight is stable and staff reports that she does not eat much at a time but she does eat and staff offers her snacks, such as pudding, between meals.  Continue remeron 7.5 mg QHS to help with appetite and maintain weight  2. Pain.  Patient states that her right leg that was fractured will hurt at times but present pain meds help relieve the pain when she needs it.  Patient is able to transfer herself from  Bed to wheelchair and go to the bathroom on her own.  She is able to dress and bathe herself.  Denies pain today  3. Advanced care planning.  DNR remains in place  I spent 15 minutes providing this consultation,  from 9:30 to 9:45 More than 50% of the time in this consultation was spent coordinating communication.   HISTORY OF PRESENT ILLNESS: Shannon Joyce is a 85 y.o. year old female with multiple medical problems including weight loss, HTN, right femur fracture (01/19), chronic pain, HTN, GERD and recurrent UTI's. Palliative Care was asked to help with symptom management and to  address goals of care. Patient is stable with stable weight, well controlled pain.  She has not had any falls, hospital visits, or infections since last visit.  CODE STATUS: DNR  PPS: 40% HOSPICE ELIGIBILITY/DIAGNOSIS: TBD  PHYSICAL EXAM:   General: elderly female sitting up in recliner in NAD Cardiovascular: regular rate and rhythm Pulmonary: clear ant fields Abdomen: soft, nontender, + bowel sounds GU: no suprapubic tenderness Extremities: no edema, no joint deformities Skin: no  rashes Neurological: Weakness but otherwise nonfocal   PAST MEDICAL HISTORY:  Past Medical History:  Diagnosis Date  . Hypertension   . UTI (lower urinary tract infection)     SOCIAL HX:  Social History   Tobacco Use  . Smoking status: Never Smoker  . Smokeless tobacco: Never Used  Substance Use Topics  . Alcohol use: No    ALLERGIES:  Allergies  Allergen Reactions  . Clinoril [Sulindac] Other (See Comments)    Hives or swelling  . Darvon [Propoxyphene] Other (See Comments)    Hives or swelling  . Levaquin [Levofloxacin In D5w] Swelling  . Penicillins Other (See Comments)    Hives or swelling  . Sulfa Antibiotics Other (See Comments)    Hives or swelling     PERTINENT MEDICATIONS:  Outpatient Encounter Medications as of 02/08/2019  Medication Sig  . Cholecalciferol (VITAMIN D PO) Take 1 tablet by mouth at bedtime.  Marland Kitchen HYDROcodone-acetaminophen (NORCO/VICODIN) 5-325 MG per tablet Take 1 tablet by mouth every 4 (four) hours as needed for moderate pain or severe pain.  Marland Kitchen ibuprofen (ADVIL,MOTRIN) 200 MG tablet Take 400 mg by mouth 2 (two) times daily.  . metoprolol succinate (TOPROL-XL) 25 MG 24 hr tablet Take 25 mg by mouth at bedtime.   . nitrofurantoin (MACRODANTIN) 50 MG capsule Take 50 mg by mouth at bedtime. Continuous course   No facility-administered encounter medications on file as of 02/08/2019.      Diar Berkel Jenetta Downer, NP

## 2019-02-08 NOTE — Telephone Encounter (Signed)
Tried calling spouse to update on patient status, which is stable.  Unable to leave message because of busy  [employer].  Will try to call back later Fitz Matsuo K. Olena Heckle NP

## 2019-07-03 ENCOUNTER — Other Ambulatory Visit: Payer: Self-pay

## 2019-07-03 ENCOUNTER — Non-Acute Institutional Stay: Payer: Medicare Other | Admitting: Adult Health Nurse Practitioner

## 2019-07-03 DIAGNOSIS — Z515 Encounter for palliative care: Secondary | ICD-10-CM

## 2019-07-03 NOTE — Progress Notes (Signed)
    [facility] Consult Note Telephone: [phone]  Fax: [phone]  PATIENT NAME: Kadin Bera DOB: 1925-04-11 MRN: [id number]  PRIMARY CARE PROVIDER:   Patient, No Pcp Per  REFERRING PROVIDER:  No referring provider defined for this encounter.  RESPONSIBLE PARTY:   Manuela Halbur (spouse) [phone]         RECOMMENDATIONS and PLAN:  1. Weight.  Patient's appetite is the same.  States that she does not eat much at a time but she does eat.  No reported weight changes. Continue current POC.  2.  Pain.  Patient states that she will have pain in her shoulders and right leg but this is relieved with current pain meds.  Able to transfer herself from bed to wheelchair and go to bathroom.  Does need minimal assistance with dressing and bathing but can mostly do it herself.  Denies any pain at this time  3. Goals of care. DNR in place.   No reported falls, hospitalizations, or infections since last visit.  Continue supportive care  I spent 20 minutes providing this consultation,  from 10:20 to 10:40. More than 50% of the time in this consultation was spent coordinating communication.   HISTORY OF PRESENT ILLNESS:  Aylin Rhoads is a 85 y.o. year old female with multiple medical problems including weight loss, HTN, right femur fracture (01/19), chronic pain, HTN, GERD and recurrent UTI's. Palliative Care was asked to help address goals of care.   CODE STATUS: DNR  PPS: 40% HOSPICE ELIGIBILITY/DIAGNOSIS: TBD  PHYSICAL EXAM:   General: patient sitting in recliner in NAD Cardiovascular: regular rate and rhythm Pulmonary: lung sounds clear; normal respiratory effort Abdomen: soft, nontender, + bowel sounds GU: no suprapubic tenderness Extremities: no edema, no joint deformities Skin: no rashes Neurological: Weakness but otherwise nonfocal   PAST MEDICAL HISTORY:  Past Medical History:  Diagnosis Date  . Hypertension   . UTI (lower urinary  tract infection)     SOCIAL HX:  Social History   Tobacco Use  . Smoking status: Never Smoker  . Smokeless tobacco: Never Used  Substance Use Topics  . Alcohol use: No    ALLERGIES:  Allergies  Allergen Reactions  . Clinoril [Sulindac] Other (See Comments)    Hives or swelling  . Darvon [Propoxyphene] Other (See Comments)    Hives or swelling  . Levaquin [Levofloxacin In D5w] Swelling  . Penicillins Other (See Comments)    Hives or swelling  . Sulfa Antibiotics Other (See Comments)    Hives or swelling     PERTINENT MEDICATIONS:  Outpatient Encounter Medications as of 83/22/2020  Medication Sig  . Cholecalciferol (VITAMIN D PO) Take 1 tablet by mouth at bedtime.  Marland Kitchen HYDROcodone-acetaminophen (NORCO/VICODIN) 5-325 MG per tablet Take 1 tablet by mouth every 4 (four) hours as needed for moderate pain or severe pain.  Marland Kitchen ibuprofen (ADVIL,MOTRIN) 200 MG tablet Take 400 mg by mouth 2 (two) times daily.  . metoprolol succinate (TOPROL-XL) 25 MG 24 hr tablet Take 25 mg by mouth at bedtime.   . nitrofurantoin (MACRODANTIN) 50 MG capsule Take 50 mg by mouth at bedtime. Continuous course   No facility-administered encounter medications on file as of 07/03/2019.      Israel Werts Jenetta Downer, NP

## 2019-08-30 ENCOUNTER — Non-Acute Institutional Stay: Payer: Medicare Other | Admitting: Hospice

## 2019-08-30 ENCOUNTER — Other Ambulatory Visit: Payer: Self-pay

## 2019-08-30 DIAGNOSIS — Z515 Encounter for palliative care: Secondary | ICD-10-CM

## 2019-08-30 NOTE — Progress Notes (Signed)
[facility] Consult Note Telephone: [phone]  Fax: [phone]   PATIENT NAME: Shannon Joyce DOB: 02-Aug-1925 MRN: [id number]  PRIMARY CARE PROVIDER:   Patient, No Pcp Per   REFERRING PROVIDER:  No referring provider defined for this encounter. N/A  RESPONSIBLE PARTY:   Extended Emergency Contact Information Primary Emergency Contact: Toves,Calvin Address: [address], [place] [address] [place] Phone: [phone] Relation: Spouse Secondary Emergency Contact: Armstrong,Claude  [place] Phone: [phone] Relation: Son   ASSESSMENT AND RECOMMENDATIONS:   1. Advance Care Planning/Goals of Care: Goals include to maximize quality of life and symptom management. DNR and MOST form in chart, up to date. Will upload into [facility] once access granted  2. Symptom Management: Currently on physical therapy for strengthening and alleviation of complaint of pain right leg. Patient denied pain/discomfort during visit; said she has pain medication when she needs and it is effective.  PT on site reported patient making progress,  able to ambulate 150 feet with rolling walker; she continues to prefer to self propel in wheelchair. Encourage activity, use pain med as ordered.   5. Follow up Palliative Care Visit: Palliative care will continue to follow for goals of care clarification and symptom management. Return 4-6 weeks or prn.  I spent 50 minutes providing this consultation,  from 12.00pm to 12.50. More than 50% of the time in this consultation was spent  In consultation with patient, clinical staff, chart review and coordinating communication.   HISTORY OF PRESENT ILLNESS:  Shannon Joyce is a 85 y.o. year old female with multiple medical problems including HTN, right femur fracture (01/19), chronic pain, HTN, GERD and recurrent UTI's. Palliative Care was asked to help address goals of care. This  is a follow up visit.  CODE STATUS: DNR  PPS: 40% HOSPICE ELIGIBILITY/DIAGNOSIS: TBD  PAST MEDICAL HISTORY:  Past Medical History:  Diagnosis Date   Hypertension    UTI (lower urinary tract infection)     SOCIAL HX:  Social History   Tobacco Use   Smoking status: Never Smoker   Smokeless tobacco: Never Used  Substance Use Topics   Alcohol use: No    ALLERGIES:  Allergies  Allergen Reactions   Clinoril [Sulindac] Other (See Comments)    Hives or swelling   Darvon [Propoxyphene] Other (See Comments)    Hives or swelling   Levaquin [Levofloxacin In D5w] Swelling   Penicillins Other (See Comments)    Hives or swelling   Sulfa Antibiotics Other (See Comments)    Hives or swelling     PERTINENT MEDICATIONS:  Outpatient Encounter Medications as of 08/30/2019  Medication Sig   Cholecalciferol (VITAMIN D PO) Take 1 tablet by mouth at bedtime.   HYDROcodone-acetaminophen (NORCO/VICODIN) 5-325 MG per tablet Take 1 tablet by mouth every 4 (four) hours as needed for moderate pain or severe pain.   ibuprofen (ADVIL,MOTRIN) 200 MG tablet Take 400 mg by mouth 2 (two) times daily.   metoprolol succinate (TOPROL-XL) 25 MG 24 hr tablet Take 25 mg by mouth at bedtime.    nitrofurantoin (MACRODANTIN) 50 MG capsule Take 50 mg by mouth at bedtime. Continuous course   No facility-administered encounter medications on file as of 08/30/2019.     PHYSICAL EXAM / ROS:  General: NAD, cooperative Cardiovascular: no chest pain reported; regular rate and rhythm  Pulmonary: no cough, SOB MSK:  no  joint deformities, ambulatory Skin: no rashes or wounds on exposed skin Neurological: Weakness, alert and oriented x 3  Teodoro Spray, NP

## 2019-10-18 ENCOUNTER — Other Ambulatory Visit: Payer: Self-pay

## 2019-10-18 ENCOUNTER — Non-Acute Institutional Stay: Payer: Medicare Other | Admitting: Hospice

## 2019-10-18 DIAGNOSIS — Z515 Encounter for palliative care: Secondary | ICD-10-CM

## 2019-10-18 NOTE — Progress Notes (Signed)
    [facility] Consult Note Telephone: [phone]  Fax: [phone]  PATIENT NAME: Shannon Joyce DOB: 1925/01/05 MRN: [id number]  PRIMARY CARE PROVIDER:   Patient, No Pcp Per  REFERRING PROVIDER:  Jaynee Eagles  RESPONSIBLE PARTY:   Extended Emergency Contact Information Primary Emergency Contact: Morton,Calvin Address: [address] [place], [place] [address] [place] Phone: [phone] Relation: Spouse Secondary Emergency Contact: Landry,Claude  [place] Phone: [phone] Relation: Son ASSESSMENT AND RECOMMENDATIONS:   1. Advance Care Planning/Goals of Care: Patient remains a resident at [facility] with goals including to maximize quality of life and symptom management. DNR and MOST form in chart, up to date; uploaded in [facility]. Most selection includes  comfort care. 2. Symptom Management: Patient recently finished  physical therapy for strengthening and alleviation of complaint of pain right leg. Patient denied pain/discomfort during visit; said she her right leg feels a lot better. At baseline with mild cognitive impairment. Ongoing supportive nursing encouraged. 3. Follow up Palliative Care Visit: Palliative care will continue to follow for goals of care clarification and symptom management. Return 4-6 weeks or prn.  I spent 25 minutes providing this consultation,  from 1.00pm  to 12.25pm. More than 50% of the time in this consultation was spent coordinating communication.   HISTORY OF PRESENT ILLNESS:Shannon Joyce a 85 y.o.year oldfemalewith multiple medical problems including HTN, right femur fracture (01/19), chronic pain, HTN, GERD and recurrent UTI's. Palliative Care was asked to help address goals of care.  CODE STATUS: DNR  PPS: 40% HOSPICE ELIGIBILITY/DIAGNOSIS: TBD  PAST MEDICAL HISTORY:  Past Medical History:  Diagnosis Date  . Hypertension    . UTI (lower urinary tract infection)     SOCIAL HX:  Social History   Tobacco Use  . Smoking status: Never Smoker  . Smokeless tobacco: Never Used  Substance Use Topics  . Alcohol use: No    ALLERGIES:  Allergies  Allergen Reactions  . Clinoril [Sulindac] Other (See Comments)    Hives or swelling  . Darvon [Propoxyphene] Other (See Comments)    Hives or swelling  . Levaquin [Levofloxacin In D5w] Swelling  . Penicillins Other (See Comments)    Hives or swelling  . Sulfa Antibiotics Other (See Comments)    Hives or swelling     PERTINENT MEDICATIONS:  Outpatient Encounter Medications as of 10/18/2019  Medication Sig  . Cholecalciferol (VITAMIN D PO) Take 1 tablet by mouth at bedtime.  Marland Kitchen HYDROcodone-acetaminophen (NORCO/VICODIN) 5-325 MG per tablet Take 1 tablet by mouth every 4 (four) hours as needed for moderate pain or severe pain.  Marland Kitchen ibuprofen (ADVIL,MOTRIN) 200 MG tablet Take 400 mg by mouth 2 (two) times daily.  . metoprolol succinate (TOPROL-XL) 25 MG 24 hr tablet Take 25 mg by mouth at bedtime.   . nitrofurantoin (MACRODANTIN) 50 MG capsule Take 50 mg by mouth at bedtime. Continuous course   No facility-administered encounter medications on file as of 10/18/2019.     ROM/PHYSICAL EXAM:  General: NAD, cooperative Cardiovascular: no chest pain reported; regular rate and rhythm  Pulmonary: no cough, no SOB; normal respiratory effort MSK:  no joint deformities, ambulatory Skin: no rashes or wounds on exposed skin Neurological: Weakness but otherwise nonfocal  Teodoro Spray, NP

## 2019-12-27 ENCOUNTER — Observation Stay (HOSPITAL_COMMUNITY): Payer: Medicare Other | Admitting: Anesthesiology

## 2019-12-27 ENCOUNTER — Emergency Department (HOSPITAL_COMMUNITY): Payer: Medicare Other

## 2019-12-27 ENCOUNTER — Observation Stay (HOSPITAL_COMMUNITY): Payer: Medicare Other

## 2019-12-27 ENCOUNTER — Inpatient Hospital Stay (HOSPITAL_COMMUNITY)
Admission: EM | Admit: 2019-12-27 | Discharge: 2020-01-02 | DRG: 493 | Disposition: A | Discharge status: Skilled Nursing Facility | Payer: Medicare Other | Source: Skilled Nursing Facility | Attending: Orthopaedic Surgery | Admitting: Orthopaedic Surgery

## 2019-12-27 ENCOUNTER — Encounter (HOSPITAL_COMMUNITY)
Admission: EM | Disposition: A | Discharge status: Skilled Nursing Facility | Payer: Self-pay | Source: Skilled Nursing Facility | Attending: Orthopaedic Surgery

## 2019-12-27 ENCOUNTER — Other Ambulatory Visit: Payer: Self-pay

## 2019-12-27 ENCOUNTER — Encounter (HOSPITAL_COMMUNITY): Payer: Self-pay

## 2019-12-27 DIAGNOSIS — R627 Adult failure to thrive: Secondary | ICD-10-CM | POA: Diagnosis present

## 2019-12-27 DIAGNOSIS — E875 Hyperkalemia: Secondary | ICD-10-CM | POA: Diagnosis present

## 2019-12-27 DIAGNOSIS — R4781 Slurred speech: Secondary | ICD-10-CM | POA: Diagnosis not present

## 2019-12-27 DIAGNOSIS — E039 Hypothyroidism, unspecified: Secondary | ICD-10-CM | POA: Diagnosis present

## 2019-12-27 DIAGNOSIS — N179 Acute kidney failure, unspecified: Secondary | ICD-10-CM

## 2019-12-27 DIAGNOSIS — I129 Hypertensive chronic kidney disease with stage 1 through stage 4 chronic kidney disease, or unspecified chronic kidney disease: Secondary | ICD-10-CM | POA: Diagnosis present

## 2019-12-27 DIAGNOSIS — E869 Volume depletion, unspecified: Secondary | ICD-10-CM | POA: Diagnosis not present

## 2019-12-27 DIAGNOSIS — Z79899 Other long term (current) drug therapy: Secondary | ICD-10-CM

## 2019-12-27 DIAGNOSIS — K5909 Other constipation: Secondary | ICD-10-CM | POA: Diagnosis present

## 2019-12-27 DIAGNOSIS — Z20822 Contact with and (suspected) exposure to covid-19: Secondary | ICD-10-CM | POA: Diagnosis present

## 2019-12-27 DIAGNOSIS — I959 Hypotension, unspecified: Secondary | ICD-10-CM | POA: Diagnosis not present

## 2019-12-27 DIAGNOSIS — Z419 Encounter for procedure for purposes other than remedying health state, unspecified: Secondary | ICD-10-CM

## 2019-12-27 DIAGNOSIS — H50111 Monocular exotropia, right eye: Secondary | ICD-10-CM | POA: Diagnosis not present

## 2019-12-27 DIAGNOSIS — E46 Unspecified protein-calorie malnutrition: Secondary | ICD-10-CM | POA: Diagnosis present

## 2019-12-27 DIAGNOSIS — Z792 Long term (current) use of antibiotics: Secondary | ICD-10-CM

## 2019-12-27 DIAGNOSIS — Z888 Allergy status to other drugs, medicaments and biological substances status: Secondary | ICD-10-CM

## 2019-12-27 DIAGNOSIS — Z79890 Hormone replacement therapy (postmenopausal): Secondary | ICD-10-CM

## 2019-12-27 DIAGNOSIS — M25561 Pain in right knee: Secondary | ICD-10-CM

## 2019-12-27 DIAGNOSIS — Z8673 Personal history of transient ischemic attack (TIA), and cerebral infarction without residual deficits: Secondary | ICD-10-CM

## 2019-12-27 DIAGNOSIS — D62 Acute posthemorrhagic anemia: Secondary | ICD-10-CM | POA: Diagnosis not present

## 2019-12-27 DIAGNOSIS — S42302A Unspecified fracture of shaft of humerus, left arm, initial encounter for closed fracture: Secondary | ICD-10-CM | POA: Diagnosis not present

## 2019-12-27 DIAGNOSIS — Z880 Allergy status to penicillin: Secondary | ICD-10-CM

## 2019-12-27 DIAGNOSIS — M25551 Pain in right hip: Secondary | ICD-10-CM

## 2019-12-27 DIAGNOSIS — N184 Chronic kidney disease, stage 4 (severe): Secondary | ICD-10-CM | POA: Diagnosis present

## 2019-12-27 DIAGNOSIS — I739 Peripheral vascular disease, unspecified: Secondary | ICD-10-CM | POA: Diagnosis present

## 2019-12-27 DIAGNOSIS — M1990 Unspecified osteoarthritis, unspecified site: Secondary | ICD-10-CM | POA: Diagnosis present

## 2019-12-27 DIAGNOSIS — R296 Repeated falls: Secondary | ICD-10-CM | POA: Diagnosis present

## 2019-12-27 DIAGNOSIS — S42332A Displaced oblique fracture of shaft of humerus, left arm, initial encounter for closed fracture: Principal | ICD-10-CM | POA: Diagnosis present

## 2019-12-27 DIAGNOSIS — Z881 Allergy status to other antibiotic agents status: Secondary | ICD-10-CM

## 2019-12-27 DIAGNOSIS — W19XXXA Unspecified fall, initial encounter: Secondary | ICD-10-CM

## 2019-12-27 DIAGNOSIS — R410 Disorientation, unspecified: Secondary | ICD-10-CM | POA: Diagnosis not present

## 2019-12-27 DIAGNOSIS — Z01818 Encounter for other preprocedural examination: Secondary | ICD-10-CM

## 2019-12-27 DIAGNOSIS — Z681 Body mass index (BMI) 19 or less, adult: Secondary | ICD-10-CM

## 2019-12-27 DIAGNOSIS — W01198A Fall on same level from slipping, tripping and stumbling with subsequent striking against other object, initial encounter: Secondary | ICD-10-CM | POA: Diagnosis present

## 2019-12-27 DIAGNOSIS — Z882 Allergy status to sulfonamides status: Secondary | ICD-10-CM

## 2019-12-27 DIAGNOSIS — Z87440 Personal history of urinary (tract) infections: Secondary | ICD-10-CM

## 2019-12-27 DIAGNOSIS — R29810 Facial weakness: Secondary | ICD-10-CM | POA: Diagnosis not present

## 2019-12-27 DIAGNOSIS — Y92121 Bathroom in nursing home as the place of occurrence of the external cause: Secondary | ICD-10-CM

## 2019-12-27 DIAGNOSIS — Z09 Encounter for follow-up examination after completed treatment for conditions other than malignant neoplasm: Secondary | ICD-10-CM

## 2019-12-27 DIAGNOSIS — R1310 Dysphagia, unspecified: Secondary | ICD-10-CM | POA: Diagnosis not present

## 2019-12-27 DIAGNOSIS — Z66 Do not resuscitate: Secondary | ICD-10-CM | POA: Diagnosis present

## 2019-12-27 DIAGNOSIS — S2242XA Multiple fractures of ribs, left side, initial encounter for closed fracture: Secondary | ICD-10-CM

## 2019-12-27 HISTORY — DX: Chronic kidney disease, unspecified: N18.9

## 2019-12-27 HISTORY — PX: ORIF HUMERUS FRACTURE: SHX2126

## 2019-12-27 LAB — CBC
HCT: 30.7 % — ABNORMAL LOW (ref 36.0–46.0)
Hemoglobin: 9.4 g/dL — ABNORMAL LOW (ref 12.0–15.0)
MCH: 34.6 pg — ABNORMAL HIGH (ref 26.0–34.0)
MCHC: 30.6 g/dL (ref 30.0–36.0)
MCV: 112.9 fL — ABNORMAL HIGH (ref 80.0–100.0)
Platelets: 193 10*3/uL (ref 150–400)
RBC: 2.72 MIL/uL — ABNORMAL LOW (ref 3.87–5.11)
RDW: 14.6 % (ref 11.5–15.5)
WBC: 16.1 10*3/uL — ABNORMAL HIGH (ref 4.0–10.5)
nRBC: 0 % (ref 0.0–0.2)

## 2019-12-27 LAB — RESPIRATORY PANEL BY RT PCR (FLU A&B, COVID)
Influenza A by PCR: NEGATIVE
Influenza B by PCR: NEGATIVE
SARS Coronavirus 2 by RT PCR: NEGATIVE

## 2019-12-27 LAB — COMPREHENSIVE METABOLIC PANEL
ALT: 13 U/L (ref 0–44)
AST: 23 U/L (ref 15–41)
Albumin: 2.8 g/dL — ABNORMAL LOW (ref 3.5–5.0)
Alkaline Phosphatase: 551 U/L — ABNORMAL HIGH (ref 38–126)
Anion gap: 12 (ref 5–15)
BUN: 45 mg/dL — ABNORMAL HIGH (ref 8–23)
CO2: 16 mmol/L — ABNORMAL LOW (ref 22–32)
Calcium: 9.1 mg/dL (ref 8.9–10.3)
Chloride: 116 mmol/L — ABNORMAL HIGH (ref 98–111)
Creatinine, Ser: 2.75 mg/dL — ABNORMAL HIGH (ref 0.44–1.00)
GFR calc Af Amer: 17 mL/min — ABNORMAL LOW (ref >60)
GFR calc non Af Amer: 14 mL/min — ABNORMAL LOW (ref >60)
Glucose, Bld: 143 mg/dL — ABNORMAL HIGH (ref 70–99)
Potassium: 5.1 mmol/L (ref 3.5–5.1)
Sodium: 144 mmol/L (ref 135–145)
Total Bilirubin: 0.5 mg/dL (ref 0.3–1.2)
Total Protein: 6.4 g/dL — ABNORMAL LOW (ref 6.5–8.1)

## 2019-12-27 LAB — SURGICAL PCR SCREEN
MRSA, PCR: NEGATIVE
Staphylococcus aureus: NEGATIVE

## 2019-12-27 LAB — PROTIME-INR
INR: 1.1 (ref 0.8–1.2)
Prothrombin Time: 13.8 seconds (ref 11.4–15.2)

## 2019-12-27 SURGERY — OPEN REDUCTION INTERNAL FIXATION (ORIF) HUMERAL SHAFT FRACTURE
Anesthesia: General | Site: Arm Upper | Laterality: Left

## 2019-12-27 MED ORDER — PHENYLEPHRINE HCL (PRESSORS) 10 MG/ML IV SOLN: INTRAVENOUS | Status: DC | PRN

## 2019-12-27 MED ORDER — FENTANYL CITRATE (PF) 100 MCG/2ML IJ SOLN
INTRAMUSCULAR | Status: AC
  Filled 2019-12-27: qty 2

## 2019-12-27 MED ORDER — ONDANSETRON HCL 4 MG/2ML IJ SOLN
4.0000 mg | Freq: Four times a day (QID) | INTRAMUSCULAR | Status: DC | PRN
  Administered 2019-12-27: 16:00:00 4 mg via INTRAVENOUS

## 2019-12-27 MED ORDER — ROCURONIUM BROMIDE 10 MG/ML (PF) SYRINGE
PREFILLED_SYRINGE | INTRAVENOUS | Status: DC | PRN
  Administered 2019-12-27: 40 mg via INTRAVENOUS

## 2019-12-27 MED ORDER — OXYCODONE HCL 5 MG/5ML PO SOLN: 5.0000 mg | Freq: Once | ORAL | Status: DC | PRN

## 2019-12-27 MED ORDER — MIDAZOLAM HCL 2 MG/2ML IJ SOLN
INTRAMUSCULAR | Status: AC
  Filled 2019-12-27: qty 2

## 2019-12-27 MED ORDER — VANCOMYCIN HCL 1000 MG IV SOLR
INTRAVENOUS | Status: AC
  Filled 2019-12-27: qty 1000

## 2019-12-27 MED ORDER — 0.9 % SODIUM CHLORIDE (POUR BTL) OPTIME
TOPICAL | Status: DC | PRN
  Administered 2019-12-27: 1000 mL

## 2019-12-27 MED ORDER — HYDROMORPHONE HCL 1 MG/ML IJ SOLN
0.5000 mg | INTRAMUSCULAR | Status: DC | PRN
  Administered 2019-12-27: 0.5 mg via INTRAVENOUS
  Filled 2019-12-27: qty 1

## 2019-12-27 MED ORDER — ACETAMINOPHEN 500 MG PO TABS
1000.0000 mg | ORAL_TABLET | Freq: Four times a day (QID) | ORAL | Status: AC
  Administered 2019-12-27 – 2019-12-28 (×4): 1000 mg via ORAL
  Filled 2019-12-27 (×4): qty 2

## 2019-12-27 MED ORDER — ALBUMIN HUMAN 5 % IV SOLN
INTRAVENOUS | Status: AC
  Filled 2019-12-27: qty 250

## 2019-12-27 MED ORDER — ACETAMINOPHEN 500 MG PO TABS: 1000.0000 mg | ORAL_TABLET | Freq: Three times a day (TID) | ORAL | Status: DC

## 2019-12-27 MED ORDER — VANCOMYCIN HCL IN DEXTROSE 1-5 GM/200ML-% IV SOLN
1000.0000 mg | Freq: Two times a day (BID) | INTRAVENOUS | Status: AC
  Administered 2019-12-28: 1000 mg via INTRAVENOUS
  Filled 2019-12-27: qty 200

## 2019-12-27 MED ORDER — OXYCODONE HCL 5 MG PO TABS
5.0000 mg | ORAL_TABLET | ORAL | Status: DC | PRN
  Administered 2019-12-28 – 2019-12-29 (×5): 5 mg via ORAL
  Filled 2019-12-27 (×5): qty 1

## 2019-12-27 MED ORDER — SUGAMMADEX SODIUM 200 MG/2ML IV SOLN
INTRAVENOUS | Status: DC | PRN
  Administered 2019-12-27: 100 mg via INTRAVENOUS

## 2019-12-27 MED ORDER — PHENYLEPHRINE 40 MCG/ML (10ML) SYRINGE FOR IV PUSH (FOR BLOOD PRESSURE SUPPORT)
PREFILLED_SYRINGE | INTRAVENOUS | Status: AC
  Filled 2019-12-27: qty 20

## 2019-12-27 MED ORDER — ENOXAPARIN SODIUM 30 MG/0.3ML ~~LOC~~ SOLN
30.0000 mg | SUBCUTANEOUS | Status: DC
  Administered 2019-12-28 – 2019-12-31 (×4): 30 mg via SUBCUTANEOUS
  Filled 2019-12-27 (×4): qty 0.3

## 2019-12-27 MED ORDER — PHENYLEPHRINE HCL-NACL 10-0.9 MG/250ML-% IV SOLN
INTRAVENOUS | Status: DC | PRN
  Administered 2019-12-27: 60 ug/min via INTRAVENOUS

## 2019-12-27 MED ORDER — PANTOPRAZOLE SODIUM 40 MG PO TBEC
40.0000 mg | DELAYED_RELEASE_TABLET | Freq: Every day | ORAL | Status: DC
  Administered 2019-12-27 – 2020-01-02 (×7): 40 mg via ORAL
  Filled 2019-12-27 (×8): qty 1

## 2019-12-27 MED ORDER — DIPHENHYDRAMINE HCL 12.5 MG/5ML PO ELIX: 12.5000 mg | ORAL_SOLUTION | ORAL | Status: DC | PRN

## 2019-12-27 MED ORDER — SENNOSIDES-DOCUSATE SODIUM 8.6-50 MG PO TABS: 1.0000 | ORAL_TABLET | Freq: Every evening | ORAL | Status: DC | PRN

## 2019-12-27 MED ORDER — MORPHINE SULFATE (PF) 4 MG/ML IV SOLN
4.0000 mg | Freq: Once | INTRAVENOUS | Status: AC
  Administered 2019-12-27: 4 mg via INTRAVENOUS
  Filled 2019-12-27: qty 1

## 2019-12-27 MED ORDER — PHENYLEPHRINE 40 MCG/ML (10ML) SYRINGE FOR IV PUSH (FOR BLOOD PRESSURE SUPPORT)
PREFILLED_SYRINGE | INTRAVENOUS | Status: DC | PRN
  Administered 2019-12-27: 160 ug via INTRAVENOUS
  Administered 2019-12-27: 120 ug via INTRAVENOUS
  Administered 2019-12-27: 160 ug via INTRAVENOUS

## 2019-12-27 MED ORDER — ONDANSETRON HCL 4 MG PO TABS: 4.0000 mg | ORAL_TABLET | Freq: Four times a day (QID) | ORAL | Status: DC | PRN

## 2019-12-27 MED ORDER — SUCCINYLCHOLINE CHLORIDE 200 MG/10ML IV SOSY
PREFILLED_SYRINGE | INTRAVENOUS | Status: AC
  Filled 2019-12-27: qty 10

## 2019-12-27 MED ORDER — VANCOMYCIN HCL 1000 MG IV SOLR
INTRAVENOUS | Status: DC | PRN
  Administered 2019-12-27: 1000 mg

## 2019-12-27 MED ORDER — BUPIVACAINE HCL 0.5 % IJ SOLN
INTRAMUSCULAR | Status: DC | PRN
  Administered 2019-12-27: 25 mL

## 2019-12-27 MED ORDER — OXYCODONE HCL 5 MG PO TABS
5.0000 mg | ORAL_TABLET | ORAL | Status: DC | PRN
  Administered 2019-12-27: 5 mg via ORAL
  Filled 2019-12-27: qty 1

## 2019-12-27 MED ORDER — FENTANYL CITRATE (PF) 100 MCG/2ML IJ SOLN
INTRAMUSCULAR | Status: DC | PRN
  Administered 2019-12-27: 25 ug via INTRAVENOUS

## 2019-12-27 MED ORDER — PROPOFOL 10 MG/ML IV BOLUS
INTRAVENOUS | Status: DC | PRN
  Administered 2019-12-27: 100 mg via INTRAVENOUS

## 2019-12-27 MED ORDER — VASOPRESSIN 20 UNIT/ML IV SOLN
INTRAVENOUS | Status: DC | PRN
  Administered 2019-12-27: 1 m[IU] via INTRAVENOUS

## 2019-12-27 MED ORDER — FENTANYL CITRATE (PF) 250 MCG/5ML IJ SOLN
INTRAMUSCULAR | Status: AC
  Filled 2019-12-27: qty 5

## 2019-12-27 MED ORDER — HYDROMORPHONE HCL 1 MG/ML IJ SOLN
0.5000 mg | INTRAMUSCULAR | Status: DC | PRN
  Administered 2019-12-28 – 2019-12-29 (×2): 0.5 mg via INTRAVENOUS
  Filled 2019-12-27 (×2): qty 1

## 2019-12-27 MED ORDER — ONDANSETRON HCL 4 MG/2ML IJ SOLN: 4.0000 mg | Freq: Four times a day (QID) | INTRAMUSCULAR | Status: DC | PRN

## 2019-12-27 MED ORDER — VANCOMYCIN HCL IN DEXTROSE 1-5 GM/200ML-% IV SOLN
1000.0000 mg | INTRAVENOUS | Status: AC
  Administered 2019-12-27: 1000 mg via INTRAVENOUS

## 2019-12-27 MED ORDER — ONDANSETRON HCL 4 MG/2ML IJ SOLN
INTRAMUSCULAR | Status: AC
  Filled 2019-12-27: qty 2

## 2019-12-27 MED ORDER — OXYCODONE HCL 5 MG PO TABS: ORAL_TABLET | ORAL | 0 refills | Status: DC

## 2019-12-27 MED ORDER — NITROFURANTOIN MACROCRYSTAL 50 MG PO CAPS
50.0000 mg | ORAL_CAPSULE | Freq: Every day | ORAL | Status: DC
  Administered 2019-12-27 – 2019-12-28 (×2): 50 mg via ORAL
  Filled 2019-12-27 (×4): qty 1

## 2019-12-27 MED ORDER — MAGNESIUM CITRATE PO SOLN: 1.0000 | Freq: Once | ORAL | Status: DC | PRN

## 2019-12-27 MED ORDER — LEVOTHYROXINE SODIUM 25 MCG PO TABS
12.5000 ug | ORAL_TABLET | Freq: Every day | ORAL | Status: DC
  Administered 2019-12-28 – 2020-01-02 (×5): 12.5 ug via ORAL
  Filled 2019-12-27 (×6): qty 1

## 2019-12-27 MED ORDER — ROCURONIUM BROMIDE 10 MG/ML (PF) SYRINGE
PREFILLED_SYRINGE | INTRAVENOUS | Status: AC
  Filled 2019-12-27: qty 10

## 2019-12-27 MED ORDER — METOPROLOL SUCCINATE ER 25 MG PO TB24
25.0000 mg | ORAL_TABLET | Freq: Every day | ORAL | Status: DC
  Administered 2019-12-27 – 2019-12-28 (×2): 25 mg via ORAL
  Filled 2019-12-27 (×2): qty 1

## 2019-12-27 MED ORDER — LIDOCAINE 2% (20 MG/ML) 5 ML SYRINGE
INTRAMUSCULAR | Status: DC | PRN
  Administered 2019-12-27: 40 mg via INTRAVENOUS

## 2019-12-27 MED ORDER — VANCOMYCIN HCL IN DEXTROSE 1-5 GM/200ML-% IV SOLN
INTRAVENOUS | Status: AC
  Filled 2019-12-27: qty 200

## 2019-12-27 MED ORDER — BISACODYL 5 MG PO TBEC: 5.0000 mg | DELAYED_RELEASE_TABLET | Freq: Every day | ORAL | Status: DC | PRN

## 2019-12-27 MED ORDER — MIRTAZAPINE 7.5 MG PO TABS
7.5000 mg | ORAL_TABLET | Freq: Every day | ORAL | Status: DC
  Administered 2019-12-27 – 2020-01-01 (×6): 7.5 mg via ORAL
  Filled 2019-12-27 (×7): qty 1

## 2019-12-27 MED ORDER — VASOPRESSIN 20 UNIT/ML IV SOLN
INTRAVENOUS | Status: AC
  Filled 2019-12-27: qty 1

## 2019-12-27 MED ORDER — DOCUSATE SODIUM 100 MG PO CAPS
100.0000 mg | ORAL_CAPSULE | Freq: Two times a day (BID) | ORAL | Status: DC
  Administered 2019-12-27 – 2020-01-02 (×10): 100 mg via ORAL
  Filled 2019-12-27 (×11): qty 1

## 2019-12-27 MED ORDER — DEXAMETHASONE SODIUM PHOSPHATE 10 MG/ML IJ SOLN
INTRAMUSCULAR | Status: DC | PRN
  Administered 2019-12-27: 5 mg via INTRAVENOUS

## 2019-12-27 MED ORDER — CHLORHEXIDINE GLUCONATE 4 % EX LIQD: 60.0000 mL | Freq: Once | CUTANEOUS | Status: DC

## 2019-12-27 MED ORDER — EPHEDRINE 5 MG/ML INJ
INTRAVENOUS | Status: AC
  Filled 2019-12-27: qty 20

## 2019-12-27 MED ORDER — POVIDONE-IODINE 10 % EX SWAB: 2.0000 "application " | Freq: Once | CUTANEOUS | Status: DC

## 2019-12-27 MED ORDER — ENOXAPARIN SODIUM 40 MG/0.4ML ~~LOC~~ SOLN: 40.0000 mg | SUBCUTANEOUS | 0 refills | Status: DC

## 2019-12-27 MED ORDER — LACTATED RINGERS IV SOLN: INTRAVENOUS | Status: DC

## 2019-12-27 MED ORDER — ALBUMIN HUMAN 5 % IV SOLN
12.5000 g | Freq: Once | INTRAVENOUS | Status: AC
  Administered 2019-12-27: 12.5 g via INTRAVENOUS

## 2019-12-27 MED ORDER — SODIUM CHLORIDE (PF) 0.9 % IJ SOLN
INTRAMUSCULAR | Status: AC
  Filled 2019-12-27: qty 20

## 2019-12-27 MED ORDER — FENTANYL CITRATE (PF) 100 MCG/2ML IJ SOLN: 25.0000 ug | INTRAMUSCULAR | Status: DC | PRN

## 2019-12-27 MED ORDER — OXYCODONE HCL 5 MG PO TABS: 5.0000 mg | ORAL_TABLET | Freq: Once | ORAL | Status: DC | PRN

## 2019-12-27 MED ORDER — LIDOCAINE 2% (20 MG/ML) 5 ML SYRINGE
INTRAMUSCULAR | Status: AC
  Filled 2019-12-27: qty 15

## 2019-12-27 MED ORDER — DOCUSATE SODIUM 100 MG PO CAPS: 100.0000 mg | ORAL_CAPSULE | Freq: Two times a day (BID) | ORAL | Status: DC

## 2019-12-27 SURGICAL SUPPLY — 63 items
BIT DRILL 110X2.5XQCK CNCT (BIT) IMPLANT
BIT DRILL 2.5 (BIT) ×2
BIT DRILL QC 110 3.5 (BIT) ×1
BIT DRILL QC 110 3.5MM (BIT) IMPLANT
BIT DRILL QC 3.3X195 (BIT) ×2 IMPLANT
BIT DRL 110X2.5XQCK CNCT (BIT) ×1
BNDG ELASTIC 4X5.8 VLCR STR LF (GAUZE/BANDAGES/DRESSINGS) ×2 IMPLANT
BNDG ELASTIC 6X5.8 VLCR STR LF (GAUZE/BANDAGES/DRESSINGS) ×2 IMPLANT
CAP LOCK NCB (Cap) ×12 IMPLANT
CHLORAPREP W/TINT 26 (MISCELLANEOUS) ×3 IMPLANT
CLOSURE STERI-STRIP 1/2X4 (GAUZE/BANDAGES/DRESSINGS) ×1
CLOSURE WOUND 1/2 X4 (GAUZE/BANDAGES/DRESSINGS)
CLSR STERI-STRIP ANTIMIC 1/2X4 (GAUZE/BANDAGES/DRESSINGS) ×1 IMPLANT
COVER SURGICAL LIGHT HANDLE (MISCELLANEOUS) ×6 IMPLANT
COVER WAND RF STERILE (DRAPES) IMPLANT
DRAPE OEC MINIVIEW 54X84 (DRAPES) ×3 IMPLANT
DRAPE U-SHAPE 47X51 STRL (DRAPES) ×3 IMPLANT
DRILL BIT QC 110 3.5MM (BIT) ×2
DRSG MEPILEX BORDER 4X8 (GAUZE/BANDAGES/DRESSINGS) IMPLANT
DRSG PAD ABDOMINAL 8X10 ST (GAUZE/BANDAGES/DRESSINGS) ×2 IMPLANT
ELECT REM PT RETURN 9FT ADLT (ELECTROSURGICAL) ×3
ELECTRODE REM PT RTRN 9FT ADLT (ELECTROSURGICAL) ×1 IMPLANT
GAUZE SPONGE 4X4 12PLY STRL (GAUZE/BANDAGES/DRESSINGS) ×2 IMPLANT
GLOVE BIOGEL PI IND STRL 8 (GLOVE) ×1 IMPLANT
GLOVE BIOGEL PI INDICATOR 8 (GLOVE) ×2
GLOVE ECLIPSE 8.0 STRL XLNG CF (GLOVE) ×3 IMPLANT
GOWN STRL REUS W/ TWL LRG LVL3 (GOWN DISPOSABLE) ×2 IMPLANT
GOWN STRL REUS W/ TWL XL LVL3 (GOWN DISPOSABLE) ×1 IMPLANT
GOWN STRL REUS W/TWL LRG LVL3 (GOWN DISPOSABLE) ×4
GOWN STRL REUS W/TWL XL LVL3 (GOWN DISPOSABLE) ×2
KIT BASIN OR (CUSTOM PROCEDURE TRAY) ×3 IMPLANT
KIT TURNOVER KIT B (KITS) ×3 IMPLANT
MANIFOLD NEPTUNE II (INSTRUMENTS) ×3 IMPLANT
NEEDLE 22X1 1/2 (OR ONLY) (NEEDLE) IMPLANT
NS IRRIG 1000ML POUR BTL (IV SOLUTION) ×3 IMPLANT
PACK ORTHO EXTREMITY (CUSTOM PROCEDURE TRAY) ×3 IMPLANT
PAD ARMBOARD 7.5X6 YLW CONV (MISCELLANEOUS) ×6 IMPLANT
PADDING CAST COTTON 6X4 STRL (CAST SUPPLIES) ×2 IMPLANT
PLATE FRACTURE NCB PA 174 H12 (Plate) ×4 IMPLANT
SCREW LOCK UNIV ST 3.5X16 (Screw) ×4 IMPLANT
SCREW LOCK UNIV ST 3.5X18 (Screw) ×2 IMPLANT
SCREW NCB 4.0 22MM (Screw) ×8 IMPLANT
SCREW NCB 4.0X20MM (Screw) ×2 IMPLANT
SCREW NCB 4.0X24MM (Screw) ×2 IMPLANT
SLING ARM FOAM STRAP LRG (SOFTGOODS) ×2 IMPLANT
SPONGE LAP 18X18 RF (DISPOSABLE) IMPLANT
STRIP CLOSURE SKIN 1/2X4 (GAUZE/BANDAGES/DRESSINGS) ×1 IMPLANT
SUCTION FRAZIER HANDLE 10FR (MISCELLANEOUS) ×2
SUCTION TUBE FRAZIER 10FR DISP (MISCELLANEOUS) ×1 IMPLANT
SUT MNCRL AB 4-0 PS2 18 (SUTURE) ×4 IMPLANT
SUT VIC AB 0 CTX 36 (SUTURE) ×2
SUT VIC AB 0 CTX36XBRD ANTBCTR (SUTURE) IMPLANT
SUT VIC AB 2-0 CT1 27 (SUTURE) ×2
SUT VIC AB 2-0 CT1 TAPERPNT 27 (SUTURE) ×1 IMPLANT
SUT VIC AB 3-0 SH 27 (SUTURE) ×8
SUT VIC AB 3-0 SH 27X BRD (SUTURE) IMPLANT
SYR CONTROL 10ML LL (SYRINGE) IMPLANT
TOWEL GREEN STERILE (TOWEL DISPOSABLE) ×3 IMPLANT
TOWEL GREEN STERILE FF (TOWEL DISPOSABLE) ×3 IMPLANT
TUBE CONNECTING 12'X1/4 (SUCTIONS) ×1
TUBE CONNECTING 12X1/4 (SUCTIONS) ×2 IMPLANT
WATER STERILE IRR 1000ML POUR (IV SOLUTION) ×3 IMPLANT
YANKAUER SUCT BULB TIP NO VENT (SUCTIONS) ×3 IMPLANT

## 2019-12-27 NOTE — ED Notes (Signed)
Pt back in room from xray 

## 2019-12-27 NOTE — Op Note (Signed)
Orthopaedic Surgery Operative Note (CSN: [id number])  Shannon Joyce  09-28-1925 Date of Surgery: 12/27/2019   Diagnoses:  left humeral shaft fracture  Procedure: Left humeral shaft open reduction internal fixation   Operative Finding Successful completion of the planned procedure.  2 lag screws with reasonable fixation, 3 screws above and below the fracture site with 3 locking proximal and 2 locking distal, were happy with overall fixation however the bone quality was extraordinarily poor.  Patient will be weightbearing and range of motion as tolerated we would like to encourage her to avoid impact activities we worry about delirium causing further issues with her recovery.  Patient's long oblique fracture is relatively difficult to manage but we feel that we had good fixation end of the case.  Post-operative plan: The patient will be readmitted to floor until able to be discharged back to SNF.  The patient will be weightbearing as tolerated with sling for comfort and dressing to be removed a 7.  DVT prophylaxis Lovenox 40 mg/day until mobilizing and then consider transition in clinic to alternative medicines.   Pain control with PRN pain medication preferring oral medicines.  Follow up plan will be scheduled in approximately 14 days for incision check and XR.  Post-Op Diagnosis: Same Surgeons:Primary: Hiram Gash, MD Assistants:Caroline McBane PA-C Location: [facility] OR ROOM 11 Anesthesia: General with regional anesthesia Antibiotics: Vancomycin 1 g with 1g vanc at surgical site Tourniquet time: * Missing tourniquet times found for documented tourniquets in log: [id number] * Estimated Blood Loss: Minimal Complications: None Specimens: None Implants: Implant Name Type Inv. Item Serial No. Manufacturer Lot No. LRB No. Used Action  PLATE FRACTURE NCB PA 174 H12 - [id number] Plate PLATE FRACTURE NCB PA 174 H12  ZIMMER [employer]ON(ORTH,TRAU,BIO,SG)  Left 2 Implanted  SCREW NCB 4.0X24MM - [id number] Screw SCREW  NCB 4.0X24MM  ZIMMER [employer]ON(ORTH,TRAU,BIO,SG)  Left 1 Implanted  SCREW NCB 4.0 22MM - [id number] Screw SCREW NCB 4.0 22MM  ZIMMER [employer]ON(ORTH,TRAU,BIO,SG)  Left 4 Implanted  SCREW NCB 4.0X20MM - [id number] Screw SCREW NCB 4.0X20MM  ZIMMER [employer]ON(ORTH,TRAU,BIO,SG)  Left 1 Implanted  CAP LOCK NCB - [id number] Cap CAP LOCK NCB  ZIMMER [employer]ON(ORTH,TRAU,BIO,SG)  Left 6 Implanted  zimmer 3.98mm Screw x 18    Zimmer  Left 1 Implanted  zimmer 35.mm screw x 16      Left 2 Implanted    Indications for Surgery:   Shannon Joyce is a 86 y.o. female with fall resulting in long oblique fracture of the left humeral shaft.  Patient is minimally ambulatory at baseline secondary to lower extremity issues uses a wheelchair and her arm is used for mobility primarily.  We discussed the case with her son Shannon Joyce, and explained the palliation of surgery as well as the ability of surgery to allow early ADLs and weightbearing.  Understanding this he like to proceed.  Benefits and risks of operative and nonoperative management were discussed prior to surgery with patient/guardian(s) and informed consent form was completed.  Specific risks including infection, need for additional surgery, periprosthetic fracture, loss of fixation, nerve or vessel injury, skin issues.   Procedure:   The patient was identified properly. Informed consent was obtained and the surgical site was marked. The patient was taken up to suite where general anesthesia was induced.  The patient was positioned supine on a regular bed with a hand table.  The left arm was prepped and draped in the usual sterile fashion.  Timeout was performed before the beginning of the case.  We  began with an anterior approach to the humerus taking her incision over the mid biceps and retracting the biceps medially.  With the skin sharp achieving hemostasis we progressed.  We identified the biceps and pulled it medially to protect medial structures and then were able to identify the  interval which is in extension deltopectoral interval.  We split the brachialis in line with its fibers were able to identify the long oblique fracture site.  There is no obvious comminution.  We able to anatomically reduce the fracture after clearing hematoma and debris.  At that point we held a reduction with a point-to-point reduction clamp and placed to interval lag screws which obtained reasonable fixation though the bone quality was poor.  We felt that additional fixation was obviously necessary.  We selected a 12 hole Biomet NCB plate which standard fracture allowed 3 screws proximal distal to the extensive the fracture.  We placed bicortical screws and locked all but the most distal of these and great fixation and alignment at the end of the case.  We stayed away from all neurovascular structures we irrigated copiously.  Local vancomycin powder was placed before a multilayer absorbable suture closure was placed as well as Steri-Strips.  An anatomic reduction orthogonal fluoroscopic views at the end of the case.  Bulky dressing was placed to avoid patient removing it.  Patient was woken and taken to PACU in stable condition.   Noemi Chapel, PA-C, present and scrubbed throughout the case, critical for completion in a timely fashion, and for retraction, instrumentation, closure.

## 2019-12-27 NOTE — ED Notes (Signed)
Pt will not keep mask on. Tried to encourage pt to wear mask without success.

## 2019-12-27 NOTE — H&P (View-Only) (Signed)
   ORTHOPAEDIC CONSULTATION  REQUESTING PHYSICIAN: Varkey, Dax T, MD  Chief Complaint: fall  HPI: Shannon Joyce is a 86 y.o. female with hypertension. She has been residing at SNF following a right femur fracture. According to chart review, she sustained a right femur fracture in January of 2019. Per patient's son, Shannon Joyce, patient has been non-ambulatory since this injury. She requires a wheelchair. She has been decompensating since this injury. She had an unwitnessed fall at SNF. She was trying to get to the bathroom. She lost her footing and fell against the toilet. EMS was called. There was an obvious deformity of the left upper extremity. Arm was reduced and splinted. She was taken to [facility]. She was given Fentanyl by EMS and Morphine in the Emergency Department.   Upon examination, the patient is very somnolent. She quickly falls back to sleep after questioning. She is able to tell mer her name and date of birth. She knows she fell, broke her arm, and requires surgery. She denies any pain when I examined her. She states she was agreeable for surgery. Due to her somnolence, Dr. Varkey spoke with the patient's son to notify him of t he current situation and the need for surgery.   Past Medical History:  Diagnosis Date  . Hypertension   . UTI (lower urinary tract infection)    History reviewed. No pertinent surgical history. Social History   Socioeconomic History  . Marital status: Married    Spouse name: Not on file  . Number of children: Not on file  . Years of education: Not on file  . Highest education level: Not on file  Occupational History  . Not on file  Tobacco Use  . Smoking status: Never Smoker  . Smokeless tobacco: Never Used  Substance and Sexual Activity  . Alcohol use: No  . Drug use: No  . Sexual activity: Not on file  Other Topics Concern  . Not on file  Social History Narrative  . Not on file   Social Determinants of Health     Financial Resource Strain:   . Difficulty of Paying Living Expenses: Not on file  Food Insecurity:   . Worried About Running Out of Food in the Last Year: Not on file  . Ran Out of Food in the Last Year: Not on file  Transportation Needs:   . Lack of Transportation (Medical): Not on file  . Lack of Transportation (Non-Medical): Not on file  Physical Activity:   . Days of Exercise per Week: Not on file  . Minutes of Exercise per Session: Not on file  Stress:   . Feeling of Stress : Not on file  Social Connections:   . Frequency of Communication with Friends and Family: Not on file  . Frequency of Social Gatherings with Friends and Family: Not on file  . Attends Religious Services: Not on file  . Active Member of Clubs or Organizations: Not on file  . Attends Club or Organization Meetings: Not on file  . Marital Status: Not on file   History reviewed. No pertinent family history. Allergies  Allergen Reactions  . Meropenem Rash  . Cefadroxil Other (See Comments)    unknown unknown   . Clinoril [Sulindac] Other (See Comments)    Hives or swelling  . Darvon [Propoxyphene] Other (See Comments)    Hives or swelling  . Gentamicin Other (See Comments)    unknown unknown   . Levaquin [Levofloxacin In D5w]   Swelling  . Penicillins Swelling and Other (See Comments)    Did it involve swelling of the face/tongue/throat, SOB, or low BP? Yes Did it involve sudden or severe rash/hives, skin peeling, or any reaction on the inside of your mouth or nose? Yes Did you need to seek medical attention at a hospital or doctor's office? Yes When did it last happen? If all above answers are "NO", may proceed with cephalosporin use.  . Sulfa Antibiotics Other (See Comments)    Hives or swelling   Prior to Admission medications   Medication Sig Start Date End Date Taking? Authorizing Provider  acetaminophen (TYLENOL) 650 MG CR tablet Take 650 mg by mouth every 8 (eight) hours as needed  for pain.   Yes [provider]  Ensure Plus (ENSURE PLUS) LIQD Take 237 mLs by mouth 3 (three) times daily between meals.   Yes [provider]  ibuprofen (ADVIL,MOTRIN) 200 MG tablet Take 400 mg by mouth every 8 (eight) hours as needed for moderate pain.    Yes [provider]  levothyroxine (SYNTHROID) 25 MCG tablet Take 12.5 mcg by mouth daily. 12/16/19  Yes [provider]  metoprolol succinate (TOPROL-XL) 25 MG 24 hr tablet Take 25 mg by mouth at bedtime.  10/31/14  Yes [provider]  mirtazapine (REMERON) 7.5 MG tablet Take 7.5 mg by mouth at bedtime. 12/25/19  Yes [provider]  omeprazole (PRILOSEC) 20 MG capsule Take 20 mg by mouth daily. 12/18/19  Yes [provider]  senna (SENOKOT) 8.6 MG TABS tablet Take 1 tablet by mouth daily as needed for mild constipation.   Yes [provider]  HYDROcodone-acetaminophen (NORCO/VICODIN) 5-325 MG per tablet Take 1 tablet by mouth every 4 (four) hours as needed for moderate pain or severe pain. Patient not taking: Reported on 12/27/2019 12/13/14   Sofia, Leslie K, PA-C  nitrofurantoin (MACRODANTIN) 50 MG capsule Take 50 mg by mouth at bedtime. Continuous course 12/01/14   [provider]   DG Ribs Unilateral W/Chest Left  Result Date: 12/27/2019 CLINICAL DATA:  LEFT humerus fracture.  Post reduction EXAM: LEFT RIBS AND CHEST - 3+ VIEW COMPARISON:  None. FINDINGS: Improved alignment midshaft humerus fracture. Mild angulation remains (15 degrees). Glenohumeral joint is intact. IMPRESSION: Improved alignment following reduction. Electronically Signed   By: Stewart  Edmunds M.D.   On: 12/27/2019 07:50   DG Elbow Complete Left  Result Date: 12/27/2019 CLINICAL DATA:  Pain after fall EXAM: LEFT ELBOW - COMPLETE 3+ VIEW COMPARISON:  None. FINDINGS: There is no evidence of fracture, dislocation, or joint effusion. There is diffuse osteopenia. Partially visualized mid humerus shaft  fracture. soft tissues are unremarkable. IMPRESSION: No acute osseous abnormality of the elbow. Electronically Signed   By: Bindu  Avutu M.D.   On: 12/27/2019 06:23   DG Humerus Left  Result Date: 12/27/2019 CLINICAL DATA:  Splint placement. EXAM: LEFT HUMERUS - 2+ VIEW COMPARISON:  Prior left humerus series same day. FINDINGS: Patient is splinted. An oblique fracture of the mid shaft of the left humerus noted. Prominent displacement and angulation deformity noted. Acromioclavicular glenohumeral degenerative change noted. Old distal left clavicle fracture noted. No focal soft tissue abnormality identified. IMPRESSION: Patient splinted. Oblique fracture of the midshaft of the left humerus is noted. Prominent displacement and angulation deformity noted. Electronically Signed   By: Thomas  Register   On: 12/27/2019 07:47   DG HumerUS Left  Result Date: 12/27/2019 CLINICAL DATA:  Pain, fall EXAM: LEFT HUMERUS - 2+   VIEW COMPARISON:  December 13, 2014 FINDINGS: There is an obliquely oriented fracture of the midshaft of the humerus. There is approximately 1.6 cm lateral displacement of the humeral shaft. Healed fracture deformity of the humeral head is again noted. There is diffuse osteopenia. Nondisplaced posterior fourth and fifth rib fractures are seen. IMPRESSION: Mildly displaced midshaft left humerus fracture. Nondisplaced posterior fourth and fifth rib fractures. Electronically Signed   By: Bindu  Avutu M.D.   On: 12/27/2019 06:23   Family History Reviewed and non-contributory, no pertinent history of problems with bleeding or anesthesia      Review of Systems 14 system ROS conducted and negative except for that noted in HPI   OBJECTIVE  Vitals: Patient Vitals for the past 8 hrs:  BP Temp Temp src Pulse Resp SpO2 Height Weight  12/27/19 0914 123/84 98.9 F (37.2 C) Oral 91 17 99 % -- --  12/27/19 0800 123/70 -- -- (!) 114 -- 95 % -- --  12/27/19 0745 -- -- -- (!) 110 -- 95 % -- --  12/27/19  0630 136/76 -- -- (!) 109 18 100 % -- --  12/27/19 0619 123/61 -- -- (!) 105 13 98 % -- --  12/27/19 0556 122/68 -- -- (!) 108 15 98 % -- --  12/27/19 0530 135/72 -- -- (!) 114 15 (!) 82 % -- --  12/27/19 0506 -- -- -- (!) 112 12 95 % 5' 4" (1.626 m) 52.2 kg  12/27/19 0505 (!) 155/87 98.3 F (36.8 C) Oral -- 15 -- -- --   General: elderly female, in no acute distress, somnolent, trying to take off hospital gown Cardiovascular: Warm extremities noted Respiratory: No cyanosis, no use of accessory musculature GI: No organomegaly, abdomen is soft and non-tender Skin: No lesions in the area of chief complaint other than those listed below in MSK exam.  Neurologic: She is able to answer questions and follow commands. Sensation difficult to access due to patient's somnolence Psychiatric: Patient is very somnolent after receiving pain medications.  Lymphatic: No swelling obvious and reported other than the area involved in the exam below Extremities  LUE: Splint CDI. Skin intact though cannot assess fully beneath splint. Nontender to palpation proximally. She was able to perform motor functioning test of left hand. + Motor in  AIN, PIN, Ulnar distributions. She endorses sensation in medial, radial, and ulnar distributions. Well perfused digits.  Other extremities: actively moves RUE, BLE without pain  Labs cbc No results for input(s): WBC, HGB, HCT, PLT in the last 72 hours.  Labs inflam No results for input(s): CRP in the last 72 hours.  Invalid input(s): ESR  Labs coag No results for input(s): INR, PTT in the last 72 hours.  Invalid input(s): PT  No results for input(s): NA, K, CL, CO2, GLUCOSE, BUN, CREATININE, CALCIUM in the last 72 hours.   ASSESSMENT AND PLAN: 86 y.o. female with the following: Displaced humerus fracture  This patient requires inpatient admission to manage this problem appropriately. She will return to SNF once stable for discharge after surgery.   Dr. Varkey  spoke with the patient's son, Shannon Berhow, regarding patient's fracture. Patient has not been ambulatory and resides in a SNF. She requires the usage of her upper extremity for transfers. We recommend surgical intervention to help with pain control and give the patient an opportunity to weight bear as tolerated using her LUE after surgery. Patient and her son have consented for surgery. The risks benefits and alternatives were discussed   with the patient and her son including but not limited to the risks of nonoperative treatment, versus surgical intervention including infection, bleeding, nerve injury, periprosthetic fracture, the need for revision surgery, leg length discrepancy, gait change, blood clots, cardiopulmonary complications, morbidity, mortality, among others, and they were willing to proceed.    - Weight Bearing Status/Activity: NWB LUE, will change after surgery  -VTE Prophylaxis: SCDs + Lovenox while in hospital  - Pain control: PRN medications, preferring oral medications  - Surgery: Plan for ORIF humeral shaft fracture today.   Alissah Redmon, PA-C 12/27/2019 

## 2019-12-27 NOTE — Anesthesia Procedure Notes (Signed)
Anesthesia Regional Block: Interscalene brachial plexus block   Pre-Anesthetic Checklist: ,, timeout performed, Correct Patient, Correct Site, Correct Laterality, Correct Procedure, Correct Position, site marked, Risks and benefits discussed,  Surgical consent,  Pre-op evaluation,  At surgeon's request and post-op pain management  Laterality: Left  Prep: chloraprep       Needles:  Injection technique: Single-shot  Needle Type: Echogenic Stimulator Needle     Needle Length: 5cm  Needle Gauge: 22     Additional Needles:   Procedures:, nerve stimulator,,,,,,,   Nerve Stimulator or Paresthesia:  Response: biceps flexion, 0.45 mA,   Additional Responses:   Narrative:  Start time: 12/27/2019 2:10 PM End time: 12/27/2019 2:20 PM Injection made incrementally with aspirations every 5 mL.  Performed by: Personally  Anesthesiologist: Albertha Ghee, MD  Additional Notes: Functioning IV was confirmed and monitors were applied.  A 78mm 22ga Arrow echogenic stimulator needle was used. Sterile prep and drape,hand hygiene and sterile gloves were used.  Negative aspiration and negative test dose prior to incremental administration of local anesthetic. The patient tolerated the procedure well.  Ultrasound guidance: relevent anatomy identified, needle position confirmed, local anesthetic spread visualized around nerve(s), vascular puncture avoided.  Image printed for medical record.

## 2019-12-27 NOTE — Progress Notes (Signed)
Orthopedic Tech Progress Note Patient Details:  Shannon Joyce 1925-09-15 [id number]  Ortho Devices Type of Ortho Device: Arm sling, Post (long arm) splint Ortho Device/Splint Location: lue. jennifer held. Ortho Device/Splint Interventions: Ordered, Application, Adjustment   Post Interventions Patient Tolerated: Well Instructions Provided: Care of device, Adjustment of device   Karolee Stamps 12/27/2019, 7:28 AM

## 2019-12-27 NOTE — Anesthesia Preprocedure Evaluation (Signed)
Anesthesia Evaluation  Patient identified by MRN, date of birth, ID band Patient awake    Reviewed: Allergy & Precautions, H&P , NPO status , Patient's Chart, lab work & pertinent test results  Airway Mallampati: II   Neck ROM: full    Dental   Pulmonary neg pulmonary ROS,    breath sounds clear to auscultation       Cardiovascular hypertension,  Rhythm:regular Rate:Normal     Neuro/Psych    GI/Hepatic   Endo/Other    Renal/GU Renal InsufficiencyRenal disease     Musculoskeletal   Abdominal   Peds  Hematology  (+) anemia ,   Anesthesia Other Findings   Reproductive/Obstetrics                             Anesthesia Physical Anesthesia Plan  ASA: II  Anesthesia Plan: General   Post-op Pain Management:  Regional for Post-op pain   Induction: Intravenous  PONV Risk Score and Plan: 3 and Ondansetron, Dexamethasone and Treatment may vary due to age or medical condition  Airway Management Planned: Oral ETT  Additional Equipment:   Intra-op Plan:   Post-operative Plan: Extubation in OR  Informed Consent: I have reviewed the patients History and Physical, chart, labs and discussed the procedure including the risks, benefits and alternatives for the proposed anesthesia with the patient or authorized representative who has indicated his/her understanding and acceptance.       Plan Discussed with: CRNA, Anesthesiologist and Surgeon  Anesthesia Plan Comments:         Anesthesia Quick Evaluation

## 2019-12-27 NOTE — ED Notes (Signed)
Pt off the floor to xray

## 2019-12-27 NOTE — ED Notes (Signed)
Please call spouse Dr. Enis Gash at [phone]

## 2019-12-27 NOTE — Progress Notes (Signed)
Patient able to tell me name and birthday and situation.  Verbal consent obtained for surgical procedure and to administer blood products.

## 2019-12-27 NOTE — ED Provider Notes (Signed)
[facility] EMERGENCY DEPARTMENT Provider Note   CSN: [id number] Arrival date & time: 12/27/19  [id number]     History Chief Complaint  Patient presents with  . Fall    Shannon Joyce is a 86 y.o. female.  The history is provided by the patient and medical records.  Fall   86 y.o. F with hx of HTN, presenting to the ED after unwitnessed fall at her SNF.  She was trying to get the bathroom, lost her footing and fell against the toilet striking left arm against the bowl.  EMS was called, noted deformity to LUE which was reduced on scene and splinted.  Given fentanyl en route.  Patient denies pain anywhere besides left upper arm and elbow, specifically no headache, neck pain, or back pain.  She is right hand dominant.  Not on anticoagulation.  Does not use cane/walker.  Past Medical History:  Diagnosis Date  . Hypertension   . UTI (lower urinary tract infection)     There are no problems to display for this patient.   No past surgical history on file.   OB History   No obstetric history on file.     No family history on file.  Social History   Tobacco Use  . Smoking status: Never Smoker  . Smokeless tobacco: Never Used  Substance Use Topics  . Alcohol use: No  . Drug use: No    Home Medications Prior to Admission medications   Medication Sig Start Date End Date Taking? Authorizing Provider  Cholecalciferol (VITAMIN D PO) Take 1 tablet by mouth at bedtime.    [provider]  HYDROcodone-acetaminophen (NORCO/VICODIN) 5-325 MG per tablet Take 1 tablet by mouth every 4 (four) hours as needed for moderate pain or severe pain. 12/13/14   Fransico Meadow, PA-C  ibuprofen (ADVIL,MOTRIN) 200 MG tablet Take 400 mg by mouth 2 (two) times daily.    [provider]  metoprolol succinate (TOPROL-XL) 25 MG 24 hr tablet Take 25 mg by mouth at bedtime.  10/31/14   [provider]  nitrofurantoin (MACRODANTIN) 50 MG capsule Take 50 mg by mouth at  bedtime. Continuous course 12/01/14   [provider]    Allergies    Clinoril [sulindac], Darvon [propoxyphene], Levaquin [levofloxacin in d5w], Penicillins, and Sulfa antibiotics  Review of Systems   Review of Systems  Musculoskeletal: Positive for arthralgias.  All other systems reviewed and are negative.   Physical Exam Updated Vital Signs BP 136/76   Pulse (!) 109   Temp 98.3 F (36.8 C) (Oral)   Resp 18   Ht 5\' 4"  (1.626 m)   Wt 52.2 kg   SpO2 100%   BMI 19.74 kg/m   Physical Exam Vitals and nursing note reviewed.  Constitutional:      Appearance: She is well-developed.  HENT:     Head: Normocephalic and atraumatic.     Comments: No visible signs of head trauma Eyes:     Conjunctiva/sclera: Conjunctivae normal.     Pupils: Pupils are equal, round, and reactive to light.  Cardiovascular:     Rate and Rhythm: Normal rate and regular rhythm.     Heart sounds: Normal heart sounds.  Pulmonary:     Effort: Pulmonary effort is normal.     Breath sounds: Normal breath sounds.  Abdominal:     General: Bowel sounds are normal.     Palpations: Abdomen is soft.  Musculoskeletal:        General:  Normal range of motion.     Cervical back: Normal range of motion.     Comments: Left upper arm appears swollen but compartments remain soft, there is tenderness along mid to distal humerus and into left elbow, moving fingers of left hand normally, normal radial pulse  Skin:    General: Skin is warm and dry.  Neurological:     Mental Status: She is alert and oriented to person, place, and time.     Comments: AAOx3, able to answer questions and follow commands, limited movement of LUE due to pain/injury but otherwise moving extremities well     ED Results / Procedures / Treatments   Labs (all labs ordered are listed, but only abnormal results are displayed) Labs Reviewed  RESPIRATORY PANEL BY RT PCR (FLU A&B, COVID)    EKG None  Radiology DG Elbow Complete  Left  Result Date: 12/27/2019 CLINICAL DATA:  Pain after fall EXAM: LEFT ELBOW - COMPLETE 3+ VIEW COMPARISON:  None. FINDINGS: There is no evidence of fracture, dislocation, or joint effusion. There is diffuse osteopenia. Partially visualized mid humerus shaft fracture. soft tissues are unremarkable. IMPRESSION: No acute osseous abnormality of the elbow. Electronically Signed   By: Prudencio Pair M.D.   On: 12/27/2019 06:23   DG HumerUS Left  Result Date: 12/27/2019 CLINICAL DATA:  Pain, fall EXAM: LEFT HUMERUS - 2+ VIEW COMPARISON:  December 13, 2014 FINDINGS: There is an obliquely oriented fracture of the midshaft of the humerus. There is approximately 1.6 cm lateral displacement of the humeral shaft. Healed fracture deformity of the humeral head is again noted. There is diffuse osteopenia. Nondisplaced posterior fourth and fifth rib fractures are seen. IMPRESSION: Mildly displaced midshaft left humerus fracture. Nondisplaced posterior fourth and fifth rib fractures. Electronically Signed   By: Prudencio Pair M.D.   On: 12/27/2019 06:23    Procedures Procedures (including critical care time)  Medications Ordered in ED Medications  morphine 4 MG/ML injection 4 mg (4 mg Intravenous Given 12/27/19 0552)  morphine 4 MG/ML injection 4 mg (4 mg Intravenous Given 12/27/19 [id number])    ED Course  I have reviewed the triage vital signs and the nursing notes.  Pertinent labs & imaging results that were available during my care of the patient were reviewed by me and considered in my medical decision making (see chart for details).    MDM Rules/Calculators/A&P  86 y.o. F here after fall at SNF.  Went to use the bathroom and fell, impact left arm against the toilet.  No LOC.  Upon EMS arrival, reportedly arm was bent around backwards and they reduced here.  Here she has a fair amount of swelling to left upper arm and pain along mid to distal humerus.  Compartments are soft and arm remains neurovascularly intact.   Suspect humerus fracture.  Plan for plain films.  Received 150 fentanyl en route, still in pain.  Given 4mg  morphine.  X-ray with displaced/angulated left humerus fracture.  Elbow films negative.  Incidentally, found to have non-displaced left fourth/fifth rib fractures, likely from impact with the arm/toilet.  She is maintaining proper oxygen saturations here without signs of respiratory distress.  6:34 AM Discussed with orthopedics, Dr. Griffin Basil-- recommends to have ortho tech apply coap splint and repeat x-ray.  If alignment not improved, contact him again and likely will need to take to OR.  Cell is [phone]) [id number].  Rapid covid was sent.  If alignment improved, can likely go home and follow-up as an OP.  As humerus films will need to be repeated, will get dedicated rib films then as well after splint has been placed.  Care will be signed out to oncoming team to follow-up on repeat films and disposition.  Final Clinical Impression(s) / ED Diagnoses Final diagnoses:  Fall, initial encounter  Closed displaced oblique fracture of shaft of left humerus, initial encounter  Closed fracture of multiple ribs of left side, initial encounter    Rx / DC Orders ED Discharge Orders    None       Larene Pickett, PA-C 12/27/19 [id number]    Orpah Greek, MD 12/27/19 [phone]

## 2019-12-27 NOTE — Consult Note (Signed)
ORTHOPAEDIC CONSULTATION  REQUESTING PHYSICIAN: Hiram Gash, MD  Chief Complaint: fall  HPI: Shannon Joyce is a 86 y.o. female with hypertension. She has been residing at [facility] following a right femur fracture. According to chart review, she sustained a right femur fracture in January of 2019. Per patient's son, Shannon Joyce, patient has been non-ambulatory since this injury. She requires a wheelchair. She has been decompensating since this injury. She had an unwitnessed fall at [facility]. She was trying to get to the bathroom. She lost her footing and fell against the toilet. EMS was called. There was an obvious deformity of the left upper extremity. Arm was reduced and splinted. She was taken to [facility] Emergency Department. She was given Fentanyl by EMS and Morphine in the Emergency Department.   Upon examination, the patient is very somnolent. She quickly falls back to sleep after questioning. She is able to tell mer her name and date of birth. She knows she fell, broke her arm, and requires surgery. She denies any pain when I examined her. She states she was agreeable for surgery. Due to her somnolence, Dr. Griffin Basil spoke with the patient's son to notify him of t he current situation and the need for surgery.   Past Medical History:  Diagnosis Date  . Hypertension   . UTI (lower urinary tract infection)    History reviewed. No pertinent surgical history. Social History   Socioeconomic History  . Marital status: Married    Spouse name: Not on file  . Number of children: Not on file  . Years of education: Not on file  . Highest education level: Not on file  Occupational History  . Not on file  Tobacco Use  . Smoking status: Never Smoker  . Smokeless tobacco: Never Used  Substance and Sexual Activity  . Alcohol use: No  . Drug use: No  . Sexual activity: Not on file  Other Topics Concern  . Not on file  Social History Narrative  . Not on file   Social Determinants of Health     Financial Resource Strain:   . Difficulty of Paying Living Expenses: Not on file  Food Insecurity:   . Worried About Charity fundraiser in the Last Year: Not on file  . Ran Out of Food in the Last Year: Not on file  Transportation Needs:   . Lack of Transportation (Medical): Not on file  . Lack of Transportation (Non-Medical): Not on file  Physical Activity:   . Days of Exercise per Week: Not on file  . Minutes of Exercise per Session: Not on file  Stress:   . Feeling of Stress : Not on file  Social Connections:   . Frequency of Communication with Friends and Family: Not on file  . Frequency of Social Gatherings with Friends and Family: Not on file  . Attends Religious Services: Not on file  . Active Member of Clubs or Organizations: Not on file  . Attends Archivist Meetings: Not on file  . Marital Status: Not on file   History reviewed. No pertinent family history. Allergies  Allergen Reactions  . Meropenem Rash  . Cefadroxil Other (See Comments)    unknown unknown   . Clinoril [Sulindac] Other (See Comments)    Hives or swelling  . Darvon [Propoxyphene] Other (See Comments)    Hives or swelling  . Gentamicin Other (See Comments)    unknown unknown   . Levaquin [Levofloxacin In D5w]  Swelling  . Penicillins Swelling and Other (See Comments)    Did it involve swelling of the face/tongue/throat, SOB, or low BP? Yes Did it involve sudden or severe rash/hives, skin peeling, or any reaction on the inside of your mouth or nose? Yes Did you need to seek medical attention at a hospital or doctor's office? Yes When did it last happen? If all above answers are "NO", may proceed with cephalosporin use.  . Sulfa Antibiotics Other (See Comments)    Hives or swelling   Prior to Admission medications   Medication Sig Start Date End Date Taking? Authorizing Provider  acetaminophen (TYLENOL) 650 MG CR tablet Take 650 mg by mouth every 8 (eight) hours as needed  for pain.   Yes [provider]  Ensure Plus (ENSURE PLUS) LIQD Take 237 mLs by mouth 3 (three) times daily between meals.   Yes [provider]  ibuprofen (ADVIL,MOTRIN) 200 MG tablet Take 400 mg by mouth every 8 (eight) hours as needed for moderate pain.    Yes [provider]  levothyroxine (SYNTHROID) 25 MCG tablet Take 12.5 mcg by mouth daily. 12/16/19  Yes [provider]  metoprolol succinate (TOPROL-XL) 25 MG 24 hr tablet Take 25 mg by mouth at bedtime.  10/31/14  Yes [provider]  mirtazapine (REMERON) 7.5 MG tablet Take 7.5 mg by mouth at bedtime. 12/25/19  Yes [provider]  omeprazole (PRILOSEC) 20 MG capsule Take 20 mg by mouth daily. 12/18/19  Yes [provider]  senna (SENOKOT) 8.6 MG TABS tablet Take 1 tablet by mouth daily as needed for mild constipation.   Yes [provider]  HYDROcodone-acetaminophen (NORCO/VICODIN) 5-325 MG per tablet Take 1 tablet by mouth every 4 (four) hours as needed for moderate pain or severe pain. Patient not taking: Reported on 12/27/2019 12/13/14   Fransico Meadow, PA-C  nitrofurantoin (MACRODANTIN) 50 MG capsule Take 50 mg by mouth at bedtime. Continuous course 12/01/14   [provider]   DG Ribs Unilateral W/Chest Left  Result Date: 12/27/2019 CLINICAL DATA:  LEFT humerus fracture.  Post reduction EXAM: LEFT RIBS AND CHEST - 3+ VIEW COMPARISON:  None. FINDINGS: Improved alignment midshaft humerus fracture. Mild angulation remains (15 degrees). Glenohumeral joint is intact. IMPRESSION: Improved alignment following reduction. Electronically Signed   By: Suzy Bouchard M.D.   On: 12/27/2019 07:50   DG Elbow Complete Left  Result Date: 12/27/2019 CLINICAL DATA:  Pain after fall EXAM: LEFT ELBOW - COMPLETE 3+ VIEW COMPARISON:  None. FINDINGS: There is no evidence of fracture, dislocation, or joint effusion. There is diffuse osteopenia. Partially visualized mid humerus shaft  fracture. soft tissues are unremarkable. IMPRESSION: No acute osseous abnormality of the elbow. Electronically Signed   By: Prudencio Pair M.D.   On: 12/27/2019 06:23   DG Humerus Left  Result Date: 12/27/2019 CLINICAL DATA:  Splint placement. EXAM: LEFT HUMERUS - 2+ VIEW COMPARISON:  Prior left humerus series same day. FINDINGS: Patient is splinted. An oblique fracture of the mid shaft of the left humerus noted. Prominent displacement and angulation deformity noted. Acromioclavicular glenohumeral degenerative change noted. Old distal left clavicle fracture noted. No focal soft tissue abnormality identified. IMPRESSION: Patient splinted. Oblique fracture of the midshaft of the left humerus is noted. Prominent displacement and angulation deformity noted. Electronically Signed   By: Marcello Moores  Register   On: 12/27/2019 07:47   DG HumerUS Left  Result Date: 12/27/2019 CLINICAL DATA:  Pain, fall EXAM: LEFT HUMERUS - 2+  VIEW COMPARISON:  December 13, 2014 FINDINGS: There is an obliquely oriented fracture of the midshaft of the humerus. There is approximately 1.6 cm lateral displacement of the humeral shaft. Healed fracture deformity of the humeral head is again noted. There is diffuse osteopenia. Nondisplaced posterior fourth and fifth rib fractures are seen. IMPRESSION: Mildly displaced midshaft left humerus fracture. Nondisplaced posterior fourth and fifth rib fractures. Electronically Signed   By: Prudencio Pair M.D.   On: 12/27/2019 06:23   Family History Reviewed and non-contributory, no pertinent history of problems with bleeding or anesthesia      Review of Systems 14 system ROS conducted and negative except for that noted in HPI   OBJECTIVE  Vitals: Patient Vitals for the past 8 hrs:  BP Temp Temp src Pulse Resp SpO2 Height Weight  12/27/19 0914 123/84 98.9 F (37.2 C) Oral 91 17 99 % -- --  12/27/19 0800 123/70 -- -- (!) 114 -- 95 % -- --  12/27/19 0745 -- -- -- (!) 110 -- 95 % -- --  12/27/19  0630 136/76 -- -- (!) 109 18 100 % -- --  12/27/19 0619 123/61 -- -- (!) 105 13 98 % -- --  12/27/19 0556 122/68 -- -- (!) 108 15 98 % -- --  12/27/19 0530 135/72 -- -- (!) 114 15 (!) 82 % -- --  12/27/19 0506 -- -- -- (!) 112 12 95 % _0  (1.626 m) 52.2 kg  12/27/19 0505 (!) 155/87 98.3 F (36.8 C) Oral -- 15 -- -- --   General: elderly female, in no acute distress, somnolent, trying to take off hospital gown Cardiovascular: Warm extremities noted Respiratory: No cyanosis, no use of accessory musculature GI: No organomegaly, abdomen is soft and non-tender Skin: No lesions in the area of chief complaint other than those listed below in MSK exam.  Neurologic: She is able to answer questions and follow commands. Sensation difficult to access due to patient's somnolence Psychiatric: Patient is very somnolent after receiving pain medications.  Lymphatic: No swelling obvious and reported other than the area involved in the exam below Extremities  LUE: Splint CDI. Skin intact though cannot assess fully beneath splint. Nontender to palpation proximally. She was able to perform motor functioning test of left hand. + Motor in  AIN, PIN, Ulnar distributions. She endorses sensation in medial, radial, and ulnar distributions. Well perfused digits.  Other extremities: actively moves RUE, BLE without pain  Labs cbc No results for input(s): WBC, HGB, HCT, PLT in the last 72 hours.  Labs inflam No results for input(s): CRP in the last 72 hours.  Invalid input(s): ESR  Labs coag No results for input(s): INR, PTT in the last 72 hours.  Invalid input(s): PT  No results for input(s): NA, K, CL, CO2, GLUCOSE, BUN, CREATININE, CALCIUM in the last 72 hours.   ASSESSMENT AND PLAN: 86 y.o. female with the following: Displaced humerus fracture  This patient requires inpatient admission to manage this problem appropriately. She will return to SNF once stable for discharge after surgery.   Dr. Griffin Basil  spoke with the patient's son, Shannon Joyce, regarding patient's fracture. Patient has not been ambulatory and resides in a SNF. She requires the usage of her upper extremity for transfers. We recommend surgical intervention to help with pain control and give the patient an opportunity to weight bear as tolerated using her LUE after surgery. Patient and her son have consented for surgery. The risks benefits and alternatives were discussed  with the patient and her son including but not limited to the risks of nonoperative treatment, versus surgical intervention including infection, bleeding, nerve injury, periprosthetic fracture, the need for revision surgery, leg length discrepancy, gait change, blood clots, cardiopulmonary complications, morbidity, mortality, among others, and they were willing to proceed.    - Weight Bearing Status/Activity: NWB LUE, will change after surgery  -VTE Prophylaxis: SCDs + Lovenox while in hospital  - Pain control: PRN medications, preferring oral medications  - Surgery: Plan for ORIF humeral shaft fracture today.   Noemi Chapel, PA-C 12/27/2019

## 2019-12-27 NOTE — Interval H&P Note (Signed)
History and Physical Interval Note:  12/27/2019 2:14 PM  Shannon Joyce  has presented today for surgery, with the diagnosis of left humeral shaft fracture.  The various methods of treatment have been discussed with the patient and family. After consideration of risks, benefits and other options for treatment, the patient has consented to  Procedure(s): OPEN REDUCTION INTERNAL FIXATION (ORIF) HUMERAL SHAFT FRACTURE (Left) as a surgical intervention.  The patient's history has been reviewed, patient examined, no change in status, stable for surgery.  I have reviewed the patient's chart and labs.  Questions were answered to the patient's satisfaction.     Hiram Gash

## 2019-12-27 NOTE — ED Notes (Signed)
Pt returned from xray without splint in place. Pt has taken all monitoring off as well as her gown.

## 2019-12-27 NOTE — Anesthesia Procedure Notes (Signed)
Procedure Name: Intubation Date/Time: 12/27/2019 2:48 PM Performed by: Imagene Riches, CRNA Pre-anesthesia Checklist: Patient identified, Emergency Drugs available, Suction available and Patient being monitored Patient Re-evaluated:Patient Re-evaluated prior to induction Oxygen Delivery Method: Circle System Utilized Preoxygenation: Pre-oxygenation with 100% oxygen Induction Type: IV induction Ventilation: Mask ventilation without difficulty Laryngoscope Size: Miller and 2 Grade View: Grade I Tube type: Oral Tube size: 6.5 mm Number of attempts: 1 Airway Equipment and Method: Stylet and Oral airway Placement Confirmation: ETT inserted through vocal cords under direct vision,  positive ETCO2 and breath sounds checked- equal and bilateral Secured at: 20 cm Tube secured with: Tape Dental Injury: Teeth and Oropharynx as per pre-operative assessment

## 2019-12-27 NOTE — ED Triage Notes (Signed)
Pt arrived via [facility] with unwitnessed fall. Pt had severe deformity to L arm with decreased PMS and purple color. After spinting PMS returned and color improved.

## 2019-12-27 NOTE — Transfer of Care (Signed)
Immediate Anesthesia Transfer of Care Note  Patient: Shannon Joyce  Procedure(s) Performed: OPEN REDUCTION INTERNAL FIXATION (ORIF) HUMERAL SHAFT FRACTURE (Left Arm Upper)  Patient Location: PACU  Anesthesia Type:GA combined with regional for post-op pain  Level of Consciousness: drowsy  Airway & Oxygen Therapy: Patient Spontanous Breathing and Patient connected to nasal cannula oxygen  Post-op Assessment: Report given to RN and Post -op Vital signs reviewed and stable  Post vital signs: Reviewed and stable  Last Vitals:  Vitals Value Taken Time  BP 106/44 12/27/19 1641  Temp    Pulse 99 12/27/19 1642  Resp 10 12/27/19 1642  SpO2 100 % 12/27/19 1642  Vitals shown include unvalidated device data.  Last Pain:  Vitals:   12/27/19 0914  TempSrc: Oral  PainSc:          Complications: No apparent anesthesia complications

## 2019-12-28 DIAGNOSIS — E46 Unspecified protein-calorie malnutrition: Secondary | ICD-10-CM | POA: Diagnosis present

## 2019-12-28 DIAGNOSIS — N184 Chronic kidney disease, stage 4 (severe): Secondary | ICD-10-CM | POA: Diagnosis present

## 2019-12-28 DIAGNOSIS — Z681 Body mass index (BMI) 19 or less, adult: Secondary | ICD-10-CM | POA: Diagnosis not present

## 2019-12-28 DIAGNOSIS — S42302A Unspecified fracture of shaft of humerus, left arm, initial encounter for closed fracture: Secondary | ICD-10-CM | POA: Diagnosis present

## 2019-12-28 DIAGNOSIS — D62 Acute posthemorrhagic anemia: Secondary | ICD-10-CM | POA: Diagnosis not present

## 2019-12-28 DIAGNOSIS — I959 Hypotension, unspecified: Secondary | ICD-10-CM | POA: Diagnosis not present

## 2019-12-28 DIAGNOSIS — I739 Peripheral vascular disease, unspecified: Secondary | ICD-10-CM | POA: Diagnosis present

## 2019-12-28 DIAGNOSIS — E869 Volume depletion, unspecified: Secondary | ICD-10-CM | POA: Diagnosis not present

## 2019-12-28 DIAGNOSIS — R4781 Slurred speech: Secondary | ICD-10-CM | POA: Diagnosis not present

## 2019-12-28 DIAGNOSIS — E039 Hypothyroidism, unspecified: Secondary | ICD-10-CM | POA: Diagnosis present

## 2019-12-28 DIAGNOSIS — I129 Hypertensive chronic kidney disease with stage 1 through stage 4 chronic kidney disease, or unspecified chronic kidney disease: Secondary | ICD-10-CM | POA: Diagnosis present

## 2019-12-28 DIAGNOSIS — I639 Cerebral infarction, unspecified: Secondary | ICD-10-CM | POA: Diagnosis not present

## 2019-12-28 DIAGNOSIS — W01198A Fall on same level from slipping, tripping and stumbling with subsequent striking against other object, initial encounter: Secondary | ICD-10-CM | POA: Diagnosis present

## 2019-12-28 DIAGNOSIS — R627 Adult failure to thrive: Secondary | ICD-10-CM | POA: Diagnosis present

## 2019-12-28 DIAGNOSIS — Z20822 Contact with and (suspected) exposure to covid-19: Secondary | ICD-10-CM | POA: Diagnosis present

## 2019-12-28 DIAGNOSIS — S42332A Displaced oblique fracture of shaft of humerus, left arm, initial encounter for closed fracture: Secondary | ICD-10-CM | POA: Diagnosis present

## 2019-12-28 DIAGNOSIS — Y92121 Bathroom in nursing home as the place of occurrence of the external cause: Secondary | ICD-10-CM | POA: Diagnosis not present

## 2019-12-28 DIAGNOSIS — K5909 Other constipation: Secondary | ICD-10-CM | POA: Diagnosis present

## 2019-12-28 DIAGNOSIS — M25551 Pain in right hip: Secondary | ICD-10-CM | POA: Diagnosis not present

## 2019-12-28 DIAGNOSIS — R1310 Dysphagia, unspecified: Secondary | ICD-10-CM | POA: Diagnosis not present

## 2019-12-28 DIAGNOSIS — R29810 Facial weakness: Secondary | ICD-10-CM | POA: Diagnosis not present

## 2019-12-28 DIAGNOSIS — M1990 Unspecified osteoarthritis, unspecified site: Secondary | ICD-10-CM | POA: Diagnosis present

## 2019-12-28 DIAGNOSIS — Z66 Do not resuscitate: Secondary | ICD-10-CM | POA: Diagnosis present

## 2019-12-28 DIAGNOSIS — N179 Acute kidney failure, unspecified: Secondary | ICD-10-CM | POA: Diagnosis present

## 2019-12-28 DIAGNOSIS — S2242XA Multiple fractures of ribs, left side, initial encounter for closed fracture: Secondary | ICD-10-CM | POA: Diagnosis present

## 2019-12-28 DIAGNOSIS — R410 Disorientation, unspecified: Secondary | ICD-10-CM | POA: Diagnosis not present

## 2019-12-28 DIAGNOSIS — W19XXXA Unspecified fall, initial encounter: Secondary | ICD-10-CM | POA: Diagnosis not present

## 2019-12-28 DIAGNOSIS — E875 Hyperkalemia: Secondary | ICD-10-CM | POA: Diagnosis present

## 2019-12-28 DIAGNOSIS — H50111 Monocular exotropia, right eye: Secondary | ICD-10-CM | POA: Diagnosis not present

## 2019-12-28 LAB — CBC
HCT: 24.6 % — ABNORMAL LOW (ref 36.0–46.0)
Hemoglobin: 7.6 g/dL — ABNORMAL LOW (ref 12.0–15.0)
MCH: 34.7 pg — ABNORMAL HIGH (ref 26.0–34.0)
MCHC: 30.9 g/dL (ref 30.0–36.0)
MCV: 112.3 fL — ABNORMAL HIGH (ref 80.0–100.0)
Platelets: 160 10*3/uL (ref 150–400)
RBC: 2.19 MIL/uL — ABNORMAL LOW (ref 3.87–5.11)
RDW: 14.6 % (ref 11.5–15.5)
WBC: 13.9 10*3/uL — ABNORMAL HIGH (ref 4.0–10.5)
nRBC: 0.1 % (ref 0.0–0.2)

## 2019-12-28 LAB — BASIC METABOLIC PANEL
Anion gap: 10 (ref 5–15)
BUN: 40 mg/dL — ABNORMAL HIGH (ref 8–23)
CO2: 16 mmol/L — ABNORMAL LOW (ref 22–32)
Calcium: 8.3 mg/dL — ABNORMAL LOW (ref 8.9–10.3)
Chloride: 112 mmol/L — ABNORMAL HIGH (ref 98–111)
Creatinine, Ser: 2.15 mg/dL — ABNORMAL HIGH (ref 0.44–1.00)
GFR calc Af Amer: 22 mL/min — ABNORMAL LOW (ref >60)
GFR calc non Af Amer: 19 mL/min — ABNORMAL LOW (ref >60)
Glucose, Bld: 148 mg/dL — ABNORMAL HIGH (ref 70–99)
Potassium: 5.7 mmol/L — ABNORMAL HIGH (ref 3.5–5.1)
Sodium: 138 mmol/L (ref 135–145)

## 2019-12-28 MED ORDER — LACTATED RINGERS IV BOLUS
250.0000 mL | Freq: Once | INTRAVENOUS | Status: AC
  Administered 2019-12-28: 250 mL via INTRAVENOUS

## 2019-12-28 NOTE — Evaluation (Signed)
Occupational Therapy Evaluation Patient Details Name: Shannon Joyce MRN: [id number] DOB: 1925-09-20 Today's Date: 12/28/2019    History of Present Illness Shannon Joyce is Joyce 86 y.o. female with hypertension. She has been residing at [facility] following Joyce right femur fracture in January of 2019 and has been non-ambulatory since this injury--but has been able to transfer herself in and out of W/C and get around in W/C by herself. She requires Joyce wheelchair. Pt has unwitnessed fall with resultant displaced humerus fracture and now s/p ORIF humeral shaft   Clinical Impression   This 86 yo female admitted and underwent above presents to acute OT with decreased mobility, decreased balance, increased pain LUE, decreased cognition all affecting her safety and independence with basic ADLs from Joyce W/C level which she was able to do until admission (but had been having increased difficulty over the last month with mulitple falls per son in room). She will benefit from acute OT with follow up OT back at ALF.    Follow Up Recommendations  Home health OT;Supervision/Assistance - 24 hour(at [facility] (son wants her to go back there))    Equipment Recommendations  3 in 1 bedside commode;Other (comment)(hoyer lift)       Precautions / Restrictions Precautions Precautions: Fall Restrictions Weight Bearing Restrictions: No Other Position/Activity Restrictions: WBAT and use of LUE as tolerated      Mobility Bed Mobility Overal bed mobility: Needs Assistance Bed Mobility: Supine to Sit     Supine to sit: Max assist        Transfers Overall transfer level: Needs assistance Equipment used: (gait belt and bed pad) Transfers: Sit to/from Stand;Stand Pivot Transfers Sit to Stand: Max assist;+2 physical assistance Stand pivot transfers: Max assist;+2 physical assistance            Balance Overall balance assessment: Needs assistance Sitting-balance support: Single extremity supported;Feet  supported Sitting balance-Leahy Scale: Poor   Postural control: Right lateral lean(intermittently with cues to correct) Standing balance support: Single extremity supported Standing balance-Leahy Scale: Zero Standing balance comment: max Joyce +2 and not fully upright                           ADL either performed or assessed with clinical judgement   ADL                                         General ADL Comments: total Joyce; for eating--pt holding food in mouth and chewing and chewing and chewing--made PA-C that came into room aware.                  Pertinent Vitals/Pain Pain Assessment: (initially no pain with me moving RUE in bed, but once getting to EOB and sitting EOB pt moaning/groaning but could not tell me where she was hurting.)     Hand Dominance Right   Extremity/Trunk Assessment Upper Extremity Assessment Upper Extremity Assessment: LUE deficits/detail LUE Deficits / Details: While in bed pt would allow me to move arm and would Joyce with moving arm without c/o pain (but started c/o pain when we she sat up on EOB). PROM/AAROM elbow distally WNL, PROM at shoulder limited to ~30 degrees flexion and abduction. LUE Coordination: decreased fine motor;decreased gross motor           Communication Communication Communication: HOH(hard to understand at times because  she is talking about things in her past (son in room and able to tell me this))   Cognition Arousal/Alertness: Awake/alert Behavior During Therapy: Anxious Overall Cognitive Status: Impaired/Different from baseline Area of Impairment: Following commands;Safety/judgement;Problem solving                       Following Commands: Follows one step commands inconsistently Safety/Judgement: Decreased awareness of safety;Decreased awareness of deficits   Problem Solving: Slow processing;Decreased initiation;Difficulty sequencing;Requires verbal cues;Requires tactile cues General  Comments: Son reports pt has been having more memory problems this last month, but since Joyce couple of days ago he has really noticed Joyce change cognitively              Home Living Family/patient expects to be discharged to:: ([facility] Joyce'd living)     Type of Home: Assisted living Home Access: Level entry     Home Layout: One level               Home Equipment: Wheelchair - manual          Prior Functioning/Environment          Comments: Per son pt was able to get herself in and OOB, get from bed to W/C by herself, get from W/C to toilet by herself, self propel her W/C, and do most of her ADLs until this past month when she has fallen more        OT Problem List: Decreased strength;Decreased range of motion;Decreased activity tolerance;Impaired balance (sitting and/or standing);Impaired UE functional use;Pain      OT Treatment/Interventions: Self-care/ADL training;Patient/family education;Balance training;Therapeutic activities    OT Goals(Current goals can be found in the care plan section) Acute Rehab OT Goals Patient Stated Goal: son--for pt to go back to [facility] OT Goal Formulation: With family Time For Goal Achievement: 01/11/20 Potential to Achieve Goals: Fair  OT Frequency: Min 3X/week(at least for arm)              AM-PAC OT "6 Clicks" Daily Activity     Outcome Measure Help from another person eating meals?: Total Help from another person taking care of personal grooming?: Total Help from another person toileting, which includes using toliet, bedpan, or urinal?: Total Help from another person bathing (including washing, rinsing, drying)?: Total Help from another person to put on and taking off regular upper body clothing?: Total Help from another person to put on and taking off regular lower body clothing?: Total 6 Click Score: 5   End of Session Equipment Utilized During Treatment: Gait belt Nurse Communication: Mobility  status(nurse tech Shannon Joyce) +2 max Joyce or maxi move--Shannon Joyce me with getting pt from bed to recliner)  Activity Tolerance: Patient limited by pain(once up to EOB) Patient left: in chair;with call bell/phone within reach;with chair alarm set;with family/visitor present(son-Shannon Joyce)  OT Visit Diagnosis: Unsteadiness on feet (R26.81);Other abnormalities of gait and mobility (R26.89);Repeated falls (R29.6);History of falling (Z91.81);Pain;Other symptoms and signs involving cognitive function Pain - Right/Left: Left Pain - part of body: Arm                Time: 1024-1106 OT Time Calculation (min): 42 min Charges:  OT General Charges $OT Visit: 1 Visit OT Evaluation $OT Eval Moderate Complexity: 1 Mod OT Treatments $Self Care/Home Management : 8-22 mins $Therapeutic Exercise: 8-22 mins  Cathy OTR/L Acute [employer] Pager [phone] Office [phone]     12/28/2019, 4:06 PM

## 2019-12-28 NOTE — Progress Notes (Addendum)
Subjective: Patient confused this a.m. and since injury per her son's report.  Complains of more pain today than yesterday.  OT in room.  They note patient retaining food in the mouth when eating.  Will order speech evaluation.  Son notes falls recently, but able to transfer with little assistance up to two days ago.  XR of pelvis? at her facility did not show obvious acute injury.    Objective:   VITALS:   Vitals:   12/27/19 2259 12/27/19 2356 12/28/19 0637 12/28/19 0754  BP: (!) 124/96  (!) 85/46 (!) 87/69  Pulse: (!) 117 98 87 69  Resp:    14  Temp:   98.3 F (36.8 C) 99 F (37.2 C)  TempSrc:   Oral Oral  SpO2:   98% (!) 82%  Weight:      Height:       CBC Latest Ref Rng & Units 12/28/2019 12/27/2019  WBC 4.0 - 10.5 K/uL 13.9(H) 16.1(H)  Hemoglobin 12.0 - 15.0 g/dL 7.6(L) 9.4(L)  Hematocrit 36.0 - 46.0 % 24.6(L) 30.7(L)  Platelets 150 - 400 K/uL 160 193   BMP Latest Ref Rng & Units 12/28/2019 12/27/2019  Glucose 70 - 99 mg/dL 148(H) 143(H)  BUN 8 - 23 mg/dL 40(H) 45(H)  Creatinine 0.44 - 1.00 mg/dL 2.15(H) 2.75(H)  Sodium 135 - 145 mmol/L 138 144  Potassium 3.5 - 5.1 mmol/L 5.7(H) 5.1  Chloride 98 - 111 mmol/L 112(H) 116(H)  CO2 22 - 32 mmol/L 16(L) 16(L)  Calcium 8.9 - 10.3 mg/dL 8.3(L) 9.1   Intake/Output      01/15 0701 - 01/16 0700 01/16 0701 - 01/17 0700   P.O.  120   I.V. (mL/kg) 1236.2 (23.7)    IV Piggyback 146.2    Total Intake(mL/kg) 1382.3 (26.5) 120 (2.3)   Blood 100    Total Output 100    Net +1282.3 +120        Urine Occurrence  1 x      Physical Exam: General: NAD.  Confused, but oriented to person and responds appropriately to questions and commands.  OT and son at bedside. Resp: No increased wob Cardio: regular rate  ABD soft Neurologically intact MSK  LUE: Sensation grossly intact distally Seems to tolerate small arc range of motion actively. Hand warm Incision: dressing C/D/I  BLE: Patient able to actively elevate legs off of  chair.  She can actively extend and flex knee.  Mildly ttp R and L greater trochanters.  Assessment / Plan: 1 Day Post-Op  S/P Procedure(s) (LRB): OPEN REDUCTION INTERNAL FIXATION (ORIF) HUMERAL SHAFT FRACTURE (Left) by Dr. Griffin Basil on 12/26/2018   Active Problems:   Fracture of humeral shaft, left, closed   Left humeral shaft fracture, status post ORIF WBAT LLE Sling for comfort Maintain padded/compressive dressings. Incentive spirometry  ABLA: Hemoglobin 7.6<9.4.  No sign of active bleeding.  Will continue to follow - labs in a.m.  Hyperkalemia: K 5.7.  Sample hemolyzed/likely inaccurate.  Will recheck in a.m.  ID:   Nitrofurantoin nightly.  Perioperative vancomycin completed.  Foley/Lines: Smart wick   VTE prophylaxis: Lovenox, SCDs, ambulation Pain control: Minimize narcotics.  Tylenol.  Oxycodone as needed. Follow - up plan: Dr. Griffin Basil will follow tomorrow  Contact information:  Edmonia Lynch MD, Roxan Hockey PA-C  Dispo:  Remain inpatient for pain control, medical issues, and post-discharge care arrangement.  Therapy evaluations ongoing.  Speech evaluation pending.  Will need 24-hour assistance (transition care team consulted).  Anticipated LOS equal to or greater than 2 midnights due to - Age 98 and older with one or more of the following:  - Expected need for hospital services (PT, OT, Nursing) required for safe  discharge  - Anticipated need for postoperative skilled nursing care or inpatient rehab   Prudencio Burly III, PA-C 12/28/2019, 11:03 AM

## 2019-12-28 NOTE — Plan of Care (Signed)

## 2019-12-28 NOTE — Evaluation (Signed)
Clinical/Bedside Swallow Evaluation Patient Details  Name: Shannon Joyce MRN: [id number] Date of Birth: 1924/12/22  Today's Date: 12/28/2019 Time: SLP Start Time (ACUTE ONLY): 1144 SLP Stop Time (ACUTE ONLY): 1157 SLP Time Calculation (min) (ACUTE ONLY): 13 min  Past Medical History:  Past Medical History:  Diagnosis Date  . Chronic kidney disease   . Hypertension   . UTI (lower urinary tract infection)    Past Surgical History:  Past Surgical History:  Procedure Laterality Date  . ABDOMINAL HYSTERECTOMY    . CHOLECYSTECTOMY    . ORTHOPEDIC SURGERY     HPI:  86 year old female admitted from SNF with displaced humerus fracture s/p repair 1/15. OT notes holding food in mouth during po intake. SLP consulted.    Assessment / Plan / Recommendation Clinical Impression  Evaluation limited due to patient's current cognitive status. Patient alert, upright in chair with son in room. Constant moaning and repetitive yelling noted throughout evaluation. Able to consume 2-3 small bites of pureed solid with swift oral transit of bolus and no overt indication of dysphagia or aspiration. Refused further po trials despite max intervention from SLP. Per son, patient with oral holding of solids as well as coughing post liquid intake in approximately 50% of trials. Unfortunately, unable to evaluate with regular texture solids or liquids today. Educated son on s/s of aspiration and instructed him to inform RN should signs of aspiration increase. SLP will re-attempt either later today or in am 1/17.  SLP Visit Diagnosis: Dysphagia, unspecified (R13.10)    Aspiration Risk  (TBD)    Diet Recommendation Regular;Thin liquid   Liquid Administration via: Cup;Straw Medication Administration: Whole meds with liquid(as tolerated) Supervision: Patient able to self feed;Full supervision/cueing for compensatory strategies Compensations: Slow rate;Small sips/bites;Minimize environmental distractions Postural  Changes: Seated upright at 90 degrees    Other  Recommendations Oral Care Recommendations: Oral care BID   Follow up Recommendations (TBD)      Frequency and Duration min 2x/week  1 week       Prognosis Prognosis for Safe Diet Advancement: Good Barriers to Reach Goals: Cognitive deficits      Swallow Study   General HPI: 86 year old female admitted from SNF with displaced humerus fracture s/p repair 1/15. OT notes holding food in mouth during po intake. SLP consulted.  Type of Study: Bedside Swallow Evaluation Previous Swallow Assessment: none noted Diet Prior to this Study: Regular;Thin liquids Temperature Spikes Noted: No Respiratory Status: Room air History of Recent Intubation: Yes Length of Intubations (days): (surgery only) Behavior/Cognition: Alert;Confused;Uncooperative;Requires cueing Oral Cavity Assessment: Within Functional Limits Oral Care Completed by SLP: Recent completion by staff Oral Cavity - Dentition: Dentures, not available Vision: Functional for self-feeding Self-Feeding Abilities: Able to feed self;Needs assist(assistance needed due to AMS) Patient Positioning: Upright in chair Baseline Vocal Quality: Normal Volitional Cough: Cognitively unable to elicit Volitional Swallow: Unable to elicit    Oral/Motor/Sensory Function Overall Oral Motor/Sensory Function: Within functional limits   Ice Chips Ice chips: Not tested   Thin Liquid Thin Liquid: Not tested(refused)    Nectar Thick Nectar Thick Liquid: Not tested   Honey Thick Honey Thick Liquid: Not tested   Puree Puree: Within functional limits Presentation: Spoon   Solid     Solid: Not tested(refused)     Gabriel Rainwater MA, CCC-SLP   Vika Buske Meryl 12/28/2019,12:01 PM

## 2019-12-28 NOTE — Anesthesia Postprocedure Evaluation (Signed)
Anesthesia Post Note  Patient: Shannon Joyce  Procedure(s) Performed: OPEN REDUCTION INTERNAL FIXATION (ORIF) HUMERAL SHAFT FRACTURE (Left Arm Upper)     Patient location during evaluation: PACU Anesthesia Type: General and Regional Level of consciousness: awake and alert Pain management: pain level controlled Vital Signs Assessment: post-procedure vital signs reviewed and stable Respiratory status: spontaneous breathing, nonlabored ventilation, respiratory function stable and patient connected to nasal cannula oxygen Cardiovascular status: blood pressure returned to baseline and stable Postop Assessment: no apparent nausea or vomiting Anesthetic complications: no    Last Vitals:  Vitals:   12/27/19 2356 12/28/19 0637  BP:  (!) 85/46  Pulse: 98 87  Resp:    Temp:  36.8 C  SpO2:  98%    Last Pain:  Vitals:   12/28/19 0637  TempSrc: Oral  PainSc:                  [place] S

## 2019-12-29 ENCOUNTER — Inpatient Hospital Stay (HOSPITAL_COMMUNITY): Payer: Medicare Other

## 2019-12-29 DIAGNOSIS — R410 Disorientation, unspecified: Secondary | ICD-10-CM

## 2019-12-29 DIAGNOSIS — I639 Cerebral infarction, unspecified: Secondary | ICD-10-CM

## 2019-12-29 DIAGNOSIS — N179 Acute kidney failure, unspecified: Secondary | ICD-10-CM

## 2019-12-29 DIAGNOSIS — W19XXXA Unspecified fall, initial encounter: Secondary | ICD-10-CM

## 2019-12-29 LAB — CBC
HCT: 26.1 % — ABNORMAL LOW (ref 36.0–46.0)
Hemoglobin: 7.9 g/dL — ABNORMAL LOW (ref 12.0–15.0)
MCH: 34.5 pg — ABNORMAL HIGH (ref 26.0–34.0)
MCHC: 30.3 g/dL (ref 30.0–36.0)
MCV: 114 fL — ABNORMAL HIGH (ref 80.0–100.0)
Platelets: 163 10*3/uL (ref 150–400)
RBC: 2.29 MIL/uL — ABNORMAL LOW (ref 3.87–5.11)
RDW: 14.7 % (ref 11.5–15.5)
WBC: 16.5 10*3/uL — ABNORMAL HIGH (ref 4.0–10.5)
nRBC: 0.3 % — ABNORMAL HIGH (ref 0.0–0.2)

## 2019-12-29 LAB — BASIC METABOLIC PANEL
Anion gap: 10 (ref 5–15)
BUN: 52 mg/dL — ABNORMAL HIGH (ref 8–23)
CO2: 17 mmol/L — ABNORMAL LOW (ref 22–32)
Calcium: 8.4 mg/dL — ABNORMAL LOW (ref 8.9–10.3)
Chloride: 113 mmol/L — ABNORMAL HIGH (ref 98–111)
Creatinine, Ser: 2.58 mg/dL — ABNORMAL HIGH (ref 0.44–1.00)
GFR calc Af Amer: 18 mL/min — ABNORMAL LOW (ref >60)
GFR calc non Af Amer: 16 mL/min — ABNORMAL LOW (ref >60)
Glucose, Bld: 108 mg/dL — ABNORMAL HIGH (ref 70–99)
Potassium: 5.3 mmol/L — ABNORMAL HIGH (ref 3.5–5.1)
Sodium: 140 mmol/L (ref 135–145)

## 2019-12-29 MED ORDER — SODIUM ZIRCONIUM CYCLOSILICATE 5 G PO PACK
5.0000 g | PACK | Freq: Every day | ORAL | Status: AC
  Administered 2019-12-30 – 2019-12-31 (×2): 5 g via ORAL
  Filled 2019-12-29 (×4): qty 1

## 2019-12-29 MED ORDER — SODIUM BICARBONATE 650 MG PO TABS
650.0000 mg | ORAL_TABLET | Freq: Two times a day (BID) | ORAL | Status: DC
  Administered 2019-12-29 – 2020-01-02 (×8): 650 mg via ORAL
  Filled 2019-12-29 (×9): qty 1

## 2019-12-29 MED ORDER — CALCIUM CITRATE-VITAMIN D 500-500 MG-UNIT PO CHEW
1.0000 | CHEWABLE_TABLET | Freq: Two times a day (BID) | ORAL | Status: DC
  Filled 2019-12-29: qty 1

## 2019-12-29 MED ORDER — OXYCODONE HCL 5 MG PO TABS
2.5000 mg | ORAL_TABLET | ORAL | Status: DC | PRN
  Administered 2019-12-29 – 2019-12-31 (×4): 5 mg via ORAL
  Filled 2019-12-29 (×6): qty 1

## 2019-12-29 NOTE — Progress Notes (Signed)
Notified by PT that patient is having trouble participating in PT today that is a change from previous. Reported that patient has a right deviation of her right eye and is unable to self correct lean to the right. Patient assessed to have right facial droop and difficulty arousing. Patient last known well around 9AM when given morning medications. RRT notified and code stroke initiated. Patient's son is at the bedside.

## 2019-12-29 NOTE — Progress Notes (Signed)
Patient hollering out frequently confused. Patient son came in to visit, would rather see patient comfortable with pain medicine than uncomfortable Medicated with oxycodone po with applesauce. Patient swallowing especially with liquids questionable, swallowing several times when given po . Recommend speech evaluation

## 2019-12-29 NOTE — Progress Notes (Signed)
Subjective: Patient is status post ORIF left humerus fracture on 1/15 by Dr. Griffin Basil. She was being evaluated by PT upon my entrance. PT noted that her right eye was deviated to the right relative to her left eye and worsening AMS. She had recently received IV Dilaudid due to complaining of increased pain. Code Stroke was called and Neurology was consulted. She does not respond to questioning but will occasionally shout out "oh god." Her son is at the bedside.   Objective:   VITALS:   Vitals:   12/28/19 2355 12/29/19 0424 12/29/19 0742 12/29/19 0744  BP:  (!) 87/70 100/79   Pulse: 96 95 (!) 115 100  Resp:      Temp:  97.8 F (36.6 C) 98.6 F (37 C)   TempSrc:  Oral Oral   SpO2: 93% 91% (!) 77% 93%  Weight:      Height:       CBC Latest Ref Rng & Units 12/29/2019 12/28/2019 12/27/2019  WBC 4.0 - 10.5 K/uL 16.5(H) 13.9(H) 16.1(H)  Hemoglobin 12.0 - 15.0 g/dL 7.9(L) 7.6(L) 9.4(L)  Hematocrit 36.0 - 46.0 % 26.1(L) 24.6(L) 30.7(L)  Platelets 150 - 400 K/uL 163 160 193   BMP Latest Ref Rng & Units 12/29/2019 12/28/2019 12/27/2019  Glucose 70 - 99 mg/dL 108(H) 148(H) 143(H)  BUN 8 - 23 mg/dL 52(H) 40(H) 45(H)  Creatinine 0.44 - 1.00 mg/dL 2.58(H) 2.15(H) 2.75(H)  Sodium 135 - 145 mmol/L 140 138 144  Potassium 3.5 - 5.1 mmol/L 5.3(H) 5.7(H) 5.1  Chloride 98 - 111 mmol/L 113(H) 112(H) 116(H)  CO2 22 - 32 mmol/L 17(L) 16(L) 16(L)  Calcium 8.9 - 10.3 mg/dL 8.4(L) 8.3(L) 9.1   Intake/Output      01/16 0701 - 01/17 0700 01/17 0701 - 01/18 0700   P.O. 240    I.V. (mL/kg)     IV Piggyback     Total Intake(mL/kg) 240 (4.6)    Blood     Total Output     Net +240         Urine Occurrence 2 x       Physical Exam: General: Confused, asleep in hospital bed, NAD Resp: No increased wob, was placed on oxygen after working with therapies Cardio: regular rate  ABD: soft Neurological: Right eye deviated to the right. Left eye appears to be midline. She follows some commands, keeps her eyes  closed MSK: LUE: Dressing C/D/I. She is not tender to palpation proximally or distally to dressing. Examination difficult due to patient's mental status and cooperation. She briefly wiggled her fingers. Sensation difficult to access due to patient's mental status, but she does react to noxious stimulation according to neurology work-up. Warm, well perfused hand.   BLE: She can actively extend and flex knee.  Mildly ttp R and L greater trochanters.  Assessment / Plan: 2 Days Post-Op  S/P Procedure(s) (LRB): OPEN REDUCTION INTERNAL FIXATION (ORIF) HUMERAL SHAFT FRACTURE (Left) by Dr. Griffin Basil on 12/26/2018   Active Problems:   Fracture of humeral shaft, left, closed   Displaced fracture of shaft of left humerus   Left humeral shaft fracture, status post ORIF WBAT LUE Sling for comfort Maintain padded/compressive dressings. Incentive spirometry  ABLA: Hemoglobin 7.9 today - improving. No sign of active bleeding.  Will continue to follow - labs in a.m.   Hyperkalemia: K 5.3. Improving.   Delirium: Patient found to have right eye deviation, which was new since yesterday. Code Stroke was called. Appreciate neurology consult. Per neurology  note, "No definite lateralizing findings seen on Neurological exam. Cognitive findings are most consistent with a delirium. DDx for etiology of her delirium includes a hospital delirium, recent administration of pain medication with paradoxical agitation (this can happen with opioids) and pain. No seizure like activity noted on exam. 2. No hemorrhage seen on CT. However, there is a small-to-medium sized age indeterminate right frontal lobe cortically-based ischemic infarction." Neurology recommending MRI brain, avoiding sedating medications, delirium precautions, ASA 81 mg po qd for stroke prophylaxis, and IV hydration.   Chronic kidney disease: Creatinine and BUN are trending upwards.   Medical co-management: Consult to Hospitalist placed. Would  appreciate Hospitalist evaluation in order for patient to be safely discharged back to ALF.   ID:   Nitrofurantoin nightly.  Perioperative vancomycin completed.  Foley/Lines: Smart wick  VTE prophylaxis: Lovenox, SCDs, ambulation Pain control: Minimize narcotics.  Tylenol.  Oxycodone as needed. Will discontinue Dilaudid as patient had increase delirium after receiving medication.   Dispo:  Remain inpatient for pain control, medical issues, and post-discharge care arrangement. Speech Pathology and therapy valuations ongoing. Will need 24-hour assistance. Patient's son would like patient to return back to [facility] once stable for discharge.   Anticipated LOS equal to or greater than 2 midnights due to - Age 84 and older with one or more of the following:  - Expected need for hospital services (PT, OT, Nursing) required for safe discharge  - Anticipated need for postoperative skilled nursing care or inpatient rehab   Ethelda Chick, PA-C 12/29/2019, 10:01 AM

## 2019-12-29 NOTE — Consult Note (Signed)
NEURO HOSPITALIST CONSULT NOTE   Requestig physician: Dr. Griffin Basil  Reason for Consult: Acute right ptosis with facial droop and inability to deviate right eye temporally, in the context of sedation  History obtained from:  RN and Chart     HPI:                                                                                                                                          Shannon Joyce is an 86 y.o. female with dementia, resident of a SNF, who was admitted on 1/15 for surgical repair of left humeral shaft fracture. She underwent ORIF of the left humerus on the same day. She tolerated the procedure well, but had significant pain on 1/16 post-procedure. Also was more confused than normal on 1/16, but son endorsed that she had been confused since her injury. OT yesterday also noted that the patient was retaining food in her mouth when eating. A speech evaluation was ordered and they noted that the evaluation was limited due to the patient constantly moaning and repeatedly yelling throughout the evaluation.   Today, PT noted that the patient was having trouble opening her right eye. When they lifted her eyelid, they noted that her right eye was deviated to the right relative to her left eye. Also noted was facial droop in conjunction with worsened AMS. She recently received 0.5 mg of IV Dilaudid for pain. Code Stroke was called.   Son has noted increased falls recently.   Past Medical History:  Diagnosis Date  . Chronic kidney disease   . Hypertension   . UTI (lower urinary tract infection)     Past Surgical History:  Procedure Laterality Date  . ABDOMINAL HYSTERECTOMY    . CHOLECYSTECTOMY    . ORTHOPEDIC SURGERY      History reviewed. No pertinent family history.            Social History:  reports that she has never smoked. She has never used smokeless tobacco. She reports that she does not drink alcohol or use drugs.  Allergies  Allergen Reactions  .  Clinoril [Sulindac] Other (See Comments)    Hives or swelling  . Darvon [Propoxyphene] Other (See Comments)    Hives or swelling  . Levaquin [Levofloxacin In D5w] Swelling  . Meropenem Rash  . Penicillins Swelling and Other (See Comments)    Did it involve swelling of the face/tongue/throat, SOB, or low BP? Yes Did it involve sudden or severe rash/hives, skin peeling, or any reaction on the inside of your mouth or nose? Yes Did you need to seek medical attention at a hospital or doctor's office? Yes When did it last happen? If all above answers are "NO", may proceed with cephalosporin use.  . Sulfa Antibiotics Other (See  Comments)    Hives or swelling  . Cefadroxil Other (See Comments)    unknown unknown   . Gentamicin Other (See Comments)    unknown unknown     MEDICATIONS:                                                                                                                     Scheduled: . docusate sodium  100 mg Oral BID  . enoxaparin (LOVENOX) injection  30 mg Subcutaneous Q24H  . levothyroxine  12.5 mcg Oral Daily  . metoprolol succinate  25 mg Oral QHS  . mirtazapine  7.5 mg Oral QHS  . nitrofurantoin  50 mg Oral QHS  . pantoprazole  40 mg Oral Daily   Continuous: . lactated ringers 50 mL/hr at 12/29/19 0505    ROS:                                                                                                                                       Unable to obtain due to AMS.    Blood pressure 100/79, pulse 100, temperature 98.6 F (37 C), temperature source Oral, resp. rate 14, height 5\' 4"  (1.626 m), weight 52.2 kg, SpO2 93 %.   General Examination:                                                                                                       Physical Exam  HEENT-  [place]/AT. Edentulous. Neck is supple.  Lungs- Respirations unlabored Extremities- No edema. LUE is immobilized in a sling.   Neurological Examination Mental Status: Keeps  eyes closed, but is awake and intermittently screaming with evidence for discomfort. Exclaims "ow" frequently. Follows no commands. Did not state her name when asked, but was able to say "[facility]" when asked for her location. Speech is dysarthric but not disproportionate to expected given that she is edentulous.  Cranial Nerves: II: PERRL. Closes eyes tightly when attempting to open. Did not blink to  threat when briefly able to test. Did not fixate on examiner's face or other visual stimuli.  III,IV, VI: Keeps eyes tightly closed and resists passive eyelid elevation. Exotropic with right eye deviated about 20 degrees to the right of midline while left eye is midline. No nystagmus. Doll's eye reflex is suppressed.  V,VII: Aurelio Jew is grossly symmetric. Reacts to tactile stimulation bilaterally.  VIII: hearing intact to voice.  IX,X: Gurgling vocalizations noted  XI: Head is midline XII: Does not protrude tongue to command Motor: LUE unable to test due to sling. Did not grip examiner's hand when asked.  RUE: Will swat at examiner vigorously during sternal rub. Will not follow commands for formal strength testing.  BLE withdraw to noxious plantar stimulation with equal strength, but does not raise antigravity.   Sensory: Reacts to noxious stimulation all 4 extremities, including light pinch to digit of left hand.  Deep Tendon Reflexes: 3+ bilateral biceps and brachioradialis. Brisk, low amplitude patellar reflexes bilaterally. No asymmetry noted.  Plantars: Weakly upgoing bilaterally.  Cerebellar/Gait: Unable to assess    Lab Results: Basic Metabolic Panel: Recent Labs  Lab 12/27/19 1124 12/28/19 0339 12/29/19 0747  NA 144 138 140  K 5.1 5.7* 5.3*  CL 116* 112* 113*  CO2 16* 16* 17*  GLUCOSE 143* 148* 108*  BUN 45* 40* 52*  CREATININE 2.75* 2.15* 2.58*  CALCIUM 9.1 8.3* 8.4*    CBC: Recent Labs  Lab 12/27/19 1124 12/28/19 0339 12/29/19 0747  WBC 16.1* 13.9* 16.5*   HGB 9.4* 7.6* 7.9*  HCT 30.7* 24.6* 26.1*  MCV 112.9* 112.3* 114.0*  PLT 193 160 163    Cardiac Enzymes: No results for input(s): CKTOTAL, CKMB, CKMBINDEX, TROPONINI in the last 168 hours.  Lipid Panel: No results for input(s): CHOL, TRIG, HDL, CHOLHDL, VLDL, LDLCALC in the last 168 hours.  Imaging: DG Humerus Left  Result Date: 12/27/2019 CLINICAL DATA:  Postoperative EXAM: LEFT HUMERUS - 2+ VIEW COMPARISON:  Fluoroscopy 12/27/2019 FINDINGS: Anteromedial side plate and screw fixation of the oblique fracture of the midshaft left humerus. Improved fracture displacement post open reduction internal fixation now in near anatomic alignment. Expected postsurgical soft tissue changes are present. Degenerative features are present at the shoulders are similar to comparison exam. Remote posttraumatic changes at the left acromioclavicular joint are unchanged. IMPRESSION: Anteromedial side plate and screw fixation of the oblique fracture of the midshaft left humerus. Improved fracture displacement post open reduction internal fixation. No acute hardware complication. Electronically Signed   By: Lovena Le M.D.   On: 12/27/2019 20:15   DG Humerus Left  Result Date: 12/27/2019 CLINICAL DATA:  Left humerus fracture EXAM: LEFT HUMERUS - 2+ VIEW COMPARISON:  12/27/2019 FINDINGS: Seven low resolution intraoperative spot views of the left humerus. Total fluoroscopy time was 37 seconds. The images demonstrate surgical plate and screw fixation of mid left humerus fractures. On 1 of the images, surgical plate appears unopposed to cortical bone at the midshaft with 2 fixating screws in the region. Screw heads project slightly beneath the surgical plate. IMPRESSION: Intraoperative fluoroscopic assistance provided during surgical fixation of left humerus fracture. Electronically Signed   By: Donavan Foil M.D.   On: 12/27/2019 18:59   DG C-Arm 1-60 Min  Result Date: 12/27/2019 CLINICAL DATA:  Internal fixation  left humeral fracture EXAM: DG C-ARM 1-60 MIN COMPARISON:  12/27/2019 FINDINGS: Plate and screw fixation in the left humerus across the mid left humeral fracture. Near anatomic alignment. IMPRESSION: Internal fixation across the mid left  humeral fracture with anatomic alignment. Electronically Signed   By: Rolm Baptise M.D.   On: 12/27/2019 19:19    Assessment: 86 year old female s/p left ORIF with post-op confusion. Code Stroke called for exotropia and apparent facial droop.  1. No definite lateralizing findings seen on Neurological exam. Cognitive findings are most consistent with a delirium. DDx for etiology of her delirium includes a hospital delirium, recent administration of pain medication with paradoxical agitation (this can happen with opioids) and pain. No seizure like activity noted on exam.  2. No hemorrhage seen on CT. However, there is a small-to-medium sized age indeterminate right frontal lobe cortically-based ischemic infarction.   3. Not a tPA candidate due to recent major operative procedure. Not a VIR candidate due to clinical presentation not being consistent with LVO.  4. Elevated BUN/Cr ratio, with BUN trending upwards  Recommendations: 1. MRI brain when able.  2. Avoid sedating medications.  3. Reorient frequently. Lights on and blinds open during the day and OOB to chair if possible with frequent stimulation and PT/OT/Speech. Lights off at night with disturbances minimized and TV off.   4. If no contraindication, start ASA 81 mg po qd for stroke prophylaxis.  5. IV hydration   Electronically signed: Dr. Kerney Elbe 12/29/2019, 10:24 AM

## 2019-12-29 NOTE — Progress Notes (Signed)
Occupational Therapy Treatment Patient Details Name: Shannon Joyce MRN: [id number] DOB: 10-27-1925 Today's Date: 12/29/2019    History of present illness [name] is a 86 y.o. female with hypertension. She has been residing at [facility] following a right femur fracture in January of 2019 and has been non-ambulatory since this injury--but has been able to transfer herself in and out of W/C and get around in W/C by herself. She requires a wheelchair. Pt has unwitnessed fall with resultant displaced humerus fracture and now s/p ORIF humeral shaft   OT comments  This patient seen with PT today due to increased A needed yesterday. Pt presented very drowsy and RN reported that she had had dilaudid earlier; however we also noticed some right facial droop, pt unable to open her right eye lid (when manually opened eye was deviated to far right)--while left eye was at midline, in sitting pt leaning to right and not able to self correct like she was yesterday. Pt returned to supine and RN made aware of these changes from yesterday. We will continue to follow.  Follow Up Recommendations  Home health OT;Supervision/Assistance - 24 hour(at [facility] (son would like for her to go back there))    Equipment Recommendations  3 in 1 bedside commode;Other (comment)       Precautions / Restrictions Precautions Precautions: Fall Precaution Comments: sling for comfort Required Braces or Orthoses: Sling Restrictions Weight Bearing Restrictions: Yes Other Position/Activity Restrictions: WBAT and use of LUE as tolerated       Mobility Bed Mobility Overal bed mobility: Needs Assistance Bed Mobility: Supine to Sit;Sit to Supine     Supine to sit: Total assist;HOB elevated;+2 for physical assistance Sit to supine: Total assist;HOB elevated;+2 for physical assistance   General bed mobility comments: Total A to get into/out of bed; no initiation or assist. Limited by arousal.  Transfers                  General transfer comment: Deferred due to safety/alertness/arousal.    Balance Overall balance assessment: Needs assistance Sitting-balance support: Feet supported;Single extremity supported Sitting balance-Leahy Scale: Poor Sitting balance - Comments: right lateral lean and posterior lean and not able to correct with cues Postural control: Posterior lean;Right lateral lean     Standing balance comment: Deferred today                                  Vision Baseline Vision/History: Wears glasses Additional Comments: Noted today that pt not opening her right eye, when manually opened her right eye is deviated all the way to the right, while the left eye is midline          Cognition Arousal/Alertness: Lethargic Behavior During Therapy: Flat affect Overall Cognitive Status: Impaired/Different from baseline Area of Impairment: Safety/judgement;Problem solving                         Safety/Judgement: Decreased awareness of safety;Decreased awareness of deficits   Problem Solving: Slow processing;Decreased initiation;Difficulty sequencing;Requires verbal cues;Requires tactile cues General Comments: Pt lethargic with right eye closed for most of session; upon opening pt with right gaze deviation. Not following any commands today. POor arousal. Moaning in pain with movement. Pt given dilaudid earlier in AM. Also noted to have right lateral lean and not able to correct to midline.        Exercises Other Exercises Other Exercises:  Pt will good PROM of digits and wrist, moans with PROM at elbow for full extension and flexion to point of restriction due to bandaging, moans with any movement of shoulder (could get ~30 degrees of abduction and flexion)      General Comments Sp02 ranging between 85-90% on RA today; donned 02 at end of session-1 liter.    Pertinent Vitals/ Pain       Pain Assessment: Faces Faces Pain Scale: Hurts even more Pain Location:  generalized with movement of whole body (supine<>sit) and with passive movement of LUE--despite pt given dilauid recently Pain Descriptors / Indicators: Grimacing;Moaning Pain Intervention(s): Limited activity within patient's tolerance;Monitored during session;Repositioned     Prior Functioning/Environment Level of Independence: Needs assistance        Comments: Per son pt was able to get herself in and OOB, get from bed to W/C by herself, get from W/C to toilet by herself, self propel her W/C, and do most of her ADLs until this past month when she has fallen more   Frequency  Min 3X/week(at least for arm)        Progress Toward Goals  OT Goals(current goals can now be found in the care plan section)  Progress towards OT goals: Not progressing toward goals - comment(due to lethargy)  Acute Rehab OT Goals Patient Stated Goal: son--for pt to go back to [place] Discharge plan remains appropriate    Co-evaluation    PT/OT/SLP Co-Evaluation/Treatment: Yes Reason for Co-Treatment: Necessary to address cognition/behavior during functional activity;Complexity of the patient's impairments (multi-system involvement);For patient/therapist safety;To address functional/ADL transfers PT goals addressed during session: Mobility/safety with mobility;Balance;Strengthening/ROM OT goals addressed during session: Strengthening/ROM      AM-PAC OT "6 Clicks" Daily Activity     Outcome Measure   Help from another person eating meals?: Total Help from another person taking care of personal grooming?: Total Help from another person toileting, which includes using toliet, bedpan, or urinal?: Total Help from another person bathing (including washing, rinsing, drying)?: Total Help from another person to put on and taking off regular upper body clothing?: Total Help from another person to put on and taking off regular lower body clothing?: Total 6 Click Score: 6    End of  Session    OT Visit Diagnosis: Unsteadiness on feet (R26.81);Other abnormalities of gait and mobility (R26.89);Repeated falls (R29.6);History of falling (Z91.81);Pain;Other symptoms and signs involving cognitive function Pain - Right/Left: Left Pain - part of body: Arm   Activity Tolerance (limited by lethargy and concern for stroke)   Patient Left in bed;with call bell/phone within reach;with bed alarm set   Nurse Communication (pt with possible stroke--new symptoms noted from yesterday)        [id number] OT Time Calculation (min): 24 min  Charges: OT General Charges $OT Visit: 1 Visit OT Treatments $Therapeutic Exercise: 8-22 mins  [facility] OTR/L Acute Rehab Services Pager [phone] Office [phone]      12/29/2019, 12:55 PM

## 2019-12-29 NOTE — Plan of Care (Signed)
Patient confused having pain issues followed by sommulence when receiving pain medications.  Worrisome for aspiration recommend swallow evaluation from speech  Nsg dx  Ineffective airway clearance   Interventions  Stay with patient when eating encourage sitting upright Chin to chest swallow possible thicken liquids Speech evaluation Check orally for any food left in mouth Document changes and findings

## 2019-12-29 NOTE — Progress Notes (Signed)
D/W pharmacy, Microbid will not reach enough concentration in urine with pt's current CKD level, will D/C.

## 2019-12-29 NOTE — Consult Note (Signed)
[facility] Medical Consultation  Shannon Joyce [id number] DOB: Apr 18, 1925 DOA: 12/27/2019 PCP: Patient, No Pcp Per   Requesting physician: Dr. Griffin Basil Date of consultation: 12/29/2019 Reason for consultation: AKI on CKD  Impression/Recommendations Active Problems:   Fracture of humeral shaft, left, closed   Displaced fracture of shaft of left humerus    1. CKD stage IV, BP has been on the low side, discontinue Metoprolol, increase LR to 75 ml/HR. Discussed with patient's son at bedside, patient used to be on trimethoprim for chronic UTI suppression, which was switched to nitrofurantoin about a month ago for worsening of her kidney function, on December 29 her creatinine level was beyond 3, prior to her baseline in 2019 below 2.  As of now clinically there is no significant fluid overload there is no sniffing and edema and she breaths on her own, but will check a chest x-ray, her potassium level borderline high but that is probably because of metabolic acidosis, which can be treated with p.o. bicarb p.o. 2. Chronic UTI, used to be on trimethoprim, switch to Macrobid about a month ago.  However Macrobid still relatively contraindicated if for patient kidney function.  And patient has a long list of antibiotics allergy including fluoroquinolones and cephalosporins, penicillins.  Options are very limited, discussed this issue with patient this time as patient's kidney function not worsening, patient family would prefer she is on UTI suppression regimen. 3. Mild hyperkalemia as above discussed with patient son regarding importance avoid constipation will do Lokelma for today. 4. Question of stroke, elevated by neurology for right-sided weakness and facial droop, reassuring, patient will have MRI, will start patient on aspirin and statin. 5. Left hip fracture status post ORIF, as per primary team, on DVT prophylaxis. 6. After mental status, lethargic, discussed with patient's son regarding  narcotic use  I will followup again tomorrow. Please contact me if I can be of assistance in the meanwhile. Thank you for this consultation.  Chief Complaint: Feeling sleepy  HPI:  86 y.o. female with dementia, CKD stage IV, HTN, OA, resident of a SNF, who was admitted on 1/15 for surgical repair of left humeral shaft fracture. She underwent ORIF of the left humerus on the same day. She tolerated the procedure well, but had significant pain on 1/16 post-procedure.  Patient has CKD for many years, Creatinine level is about two 1 year ago.  As per patient's son patient has been on chronic UTI suppression with trimethoprimtoday, which were changed to [place] about a month ago for worsening of her kidney function.most recent outpatient kidney function reading showed creatinine level of about 3 on December 29. Patient's son also reported the patient has been taking around-the-clock ibuprofen for worsening of her joint pain every day for months.  Patient son also reported patient has chronic constipation.  Today, PT noted that the patient was having trouble opening her right eye. When they lifted her eyelid, they noted that her right eye was deviated to the right relative to her left eye. Also noted was facial droop in conjunction with worsened AMS. She recently received 0.5 mg of IV Dilaudid for pain. Code Stroke was called.   Review of Systems:  Unable to perform patient very lethargic  Past Medical History:  Diagnosis Date  . Chronic kidney disease   . Hypertension   . UTI (lower urinary tract infection)    Past Surgical History:  Procedure Laterality Date  . ABDOMINAL HYSTERECTOMY    . CHOLECYSTECTOMY    . ORTHOPEDIC  SURGERY     Social History:  reports that she has never smoked. She has never used smokeless tobacco. She reports that she does not drink alcohol or use drugs.  Allergies  Allergen Reactions  . Clinoril [Sulindac] Other (See Comments)    Hives or swelling  . Darvon  [Propoxyphene] Other (See Comments)    Hives or swelling  . Levaquin [Levofloxacin In D5w] Swelling  . Meropenem Rash  . Penicillins Swelling and Other (See Comments)    Did it involve swelling of the face/tongue/throat, SOB, or low BP? Yes Did it involve sudden or severe rash/hives, skin peeling, or any reaction on the inside of your mouth or nose? Yes Did you need to seek medical attention at a hospital or doctor's office? Yes When did it last happen? If all above answers are "NO", may proceed with cephalosporin use.  . Sulfa Antibiotics Other (See Comments)    Hives or swelling  . Cefadroxil Other (See Comments)    unknown unknown   . Gentamicin Other (See Comments)    unknown unknown    History reviewed. No pertinent family history.  Prior to Admission medications   Medication Sig Start Date End Date Taking? Authorizing Provider  acetaminophen (TYLENOL) 650 MG CR tablet Take 650 mg by mouth every 8 (eight) hours as needed for pain.   Yes [provider]  Ensure Plus (ENSURE PLUS) LIQD Take 237 mLs by mouth 3 (three) times daily between meals.   Yes [provider]  ibuprofen (ADVIL,MOTRIN) 200 MG tablet Take 400 mg by mouth every 8 (eight) hours as needed for moderate pain.    Yes [provider]  levothyroxine (SYNTHROID) 25 MCG tablet Take 12.5 mcg by mouth daily. 12/16/19  Yes [provider]  metoprolol succinate (TOPROL-XL) 25 MG 24 hr tablet Take 25 mg by mouth at bedtime.  10/31/14  Yes [provider]  mirtazapine (REMERON) 7.5 MG tablet Take 7.5 mg by mouth at bedtime. 12/25/19  Yes [provider]  omeprazole (PRILOSEC) 20 MG capsule Take 20 mg by mouth daily. 12/18/19  Yes [provider]  senna (SENOKOT) 8.6 MG TABS tablet Take 1 tablet by mouth daily as needed for mild constipation.   Yes [provider]  enoxaparin (LOVENOX) 40 MG/0.4ML injection Inject 0.4 mLs (40 mg total) into the skin  daily. 12/27/19 01/26/20  Ethelda Chick, PA-C  HYDROcodone-acetaminophen (NORCO/VICODIN) 5-325 MG per tablet Take 1 tablet by mouth every 4 (four) hours as needed for moderate pain or severe pain. Patient not taking: Reported on 12/27/2019 12/13/14   Fransico Meadow, PA-C  nitrofurantoin (MACRODANTIN) 50 MG capsule Take 50 mg by mouth at bedtime. Continuous course 12/01/14   [provider]  oxyCODONE (OXY IR/ROXICODONE) 5 MG immediate release tablet Take 0.5 - 1 pill every 6 hrs as needed for pain 12/27/19   Ethelda Chick, PA-C   Physical Exam: Blood pressure 100/79, pulse 100, temperature 98.6 F (37 C), temperature source Oral, resp. rate 14, height 5\' 4"  (1.626 m), weight 52.2 kg, SpO2 93 %. Vitals:   12/29/19 0742 12/29/19 0744  BP: 100/79   Pulse: (!) 115 100  Resp:    Temp: 98.6 F (37 C)   SpO2: (!) 77% 93%     General: Sleeping looks comfortable  Eyes: Open eyes for voice  ENT: Tongue looks dry  Neck: Supple  Cardiovascular: Regular rate and rhythm  Respiratory: Clear lungs bilaterally no crackles no wheezing  Abdomen: Soft  nontender nondistended  Skin: Dry  Musculoskeletal: Left surgical site clean  Psychiatric: Sleepy neurologic: Moving all limbs  Labs on Admission:  Basic Metabolic Panel: Recent Labs  Lab 12/27/19 1124 12/28/19 0339 12/29/19 0747  NA 144 138 140  K 5.1 5.7* 5.3*  CL 116* 112* 113*  CO2 16* 16* 17*  GLUCOSE 143* 148* 108*  BUN 45* 40* 52*  CREATININE 2.75* 2.15* 2.58*  CALCIUM 9.1 8.3* 8.4*   Liver Function Tests: Recent Labs  Lab 12/27/19 1124  AST 23  ALT 13  ALKPHOS 551*  BILITOT 0.5  PROT 6.4*  ALBUMIN 2.8*   No results for input(s): LIPASE, AMYLASE in the last 168 hours. No results for input(s): AMMONIA in the last 168 hours. CBC: Recent Labs  Lab 12/27/19 1124 12/28/19 0339 12/29/19 0747  WBC 16.1* 13.9* 16.5*  HGB 9.4* 7.6* 7.9*  HCT 30.7* 24.6* 26.1*  MCV 112.9* 112.3* 114.0*  PLT 193 160  163   Cardiac Enzymes: No results for input(s): CKTOTAL, CKMB, CKMBINDEX, TROPONINI in the last 168 hours. BNP: Invalid input(s): POCBNP CBG: No results for input(s): GLUCAP in the last 168 hours.  Radiological Exams on Admission: CT HEAD WO CONTRAST  Result Date: 12/29/2019 CLINICAL DATA:  Neuro deficit. Stroke suspected. Right eye weakness. EXAM: CT HEAD WITHOUT CONTRAST TECHNIQUE: Contiguous axial images were obtained from the base of the skull through the vertex without intravenous contrast. COMPARISON:  None. 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 This infarct is age indeterminate. There is some progression of other periventricular white matter hypoattenuation bilaterally. A remote lacunar infarct of the right internal capsule is stable. Basal ganglia are otherwise within normal limits. The brainstem and cerebellum are within normal limits. The ventricles are proportionate to the degree of atrophy. No significant extraaxial fluid collection is present. Vascular: Atherosclerotic calcifications are present within the cavernous internal carotid arteries bilaterally. No hyperdense vessel is present. Skull: Calvarium is intact. No focal lytic or blastic lesions are present. No significant extracranial soft tissue lesion is present. Sinuses/Orbits: The paranasal sinuses and mastoid air cells are clear. The globes and orbits are within normal limits. ASPECTS [facility] Stroke Program Early CT Score) - Ganglionic level infarction (caudate, lentiform nuclei, internal capsule, insula, M1-M3 cortex): 6/7 - Supraganglionic infarction (M4-M6 cortex): 3/3 Total score (0-10 with 10 being normal): 9/10 IMPRESSION: 1. Age indeterminate infarct of the anterior right frontal lobe is likely remote. 2. If the right frontal lobe infarct is considered, aspects score is 9/10. 3. Slight progression of atrophy and white matter disease  consistent with chronic microvascular ischemia otherwise. The above was relayed via text pager to Dr. Kerney Elbe on 12/29/2019 at 10:53 . Electronically Signed   By: San Morelle M.D.   On: 12/29/2019 10:53   DG Humerus Left  Result Date: 12/27/2019 CLINICAL DATA:  Postoperative EXAM: LEFT HUMERUS - 2+ VIEW COMPARISON:  Fluoroscopy 12/27/2019 FINDINGS: Anteromedial side plate and screw fixation of the oblique fracture of the midshaft left humerus. Improved fracture displacement post open reduction internal fixation now in near anatomic alignment. Expected postsurgical soft tissue changes are present. Degenerative features are present at the shoulders are similar to comparison exam. Remote posttraumatic changes at the left acromioclavicular joint are unchanged. IMPRESSION: Anteromedial side plate and screw fixation of the oblique fracture of the midshaft left humerus. Improved fracture displacement post open reduction internal fixation. No acute hardware complication. Electronically Signed   By: March Rummage  [facility] M.D.   On: 12/27/2019 20:15   DG Humerus Left  Result Date: 12/27/2019 CLINICAL DATA:  Left humerus fracture EXAM: LEFT HUMERUS - 2+ VIEW COMPARISON:  12/27/2019 FINDINGS: Seven low resolution intraoperative spot views of the left humerus. Total fluoroscopy time was 37 seconds. The images demonstrate surgical plate and screw fixation of mid left humerus fractures. On 1 of the images, surgical plate appears unopposed to cortical bone at the midshaft with 2 fixating screws in the region. Screw heads project slightly beneath the surgical plate. IMPRESSION: Intraoperative fluoroscopic assistance provided during surgical fixation of left humerus fracture. Electronically Signed   By: Donavan Foil M.D.   On: 12/27/2019 18:59   DG C-Arm 1-60 Min  Result Date: 12/27/2019 CLINICAL DATA:  Internal fixation left humeral fracture EXAM: DG C-ARM 1-60 MIN COMPARISON:  12/27/2019 FINDINGS: Plate and screw  fixation in the left humerus across the mid left humeral fracture. Near anatomic alignment. IMPRESSION: Internal fixation across the mid left humeral fracture with anatomic alignment. Electronically Signed   By: Rolm Baptise M.D.   On: 12/27/2019 19:19    EKG: Independently reviewed.  Time spent: [facility] Pager [phone]  If 7PM-7AM, please contact night-coverage [email or web address] Password [email or web address] 12/29/2019, 1:19 PM

## 2019-12-29 NOTE — Evaluation (Signed)
Physical Therapy Evaluation Patient Details Name: Shannon Joyce MRN: [id number] DOB: 1925/05/11 Today's Date: 12/29/2019   History of Present Illness  Shannon Joyce is a 86 y.o. female with hypertension. She has been residing at [facility] following a right femur fracture in January of 2019 and has been non-ambulatory since this injury--but has been able to transfer herself in and out of W/C and get around in W/C by herself. She requires a wheelchair. Pt has unwitnessed fall with resultant displaced humerus fracture and now s/p ORIF humeral shaft  Clinical Impression  Patient presents with decreased arousal, generalized weakness, pain, right gaze preference, possible facial droop, impaired balance and impaired mobility s/p above. Pt uses w/c for mobility at baseline and was transferring without assist and doing ADLs until this past month. Today, pt requires total A of 2 for bed mobility and assist for sitting balance. Mobility limited due to decreased arousal/alertness (pt given pain meds earlier in AM). Noted to have right lateral lean and right eye gaze deviation when opened manually with right eye. Not able to open right eye independently. RN and PA notified of possible changes and concern for stroke vs affects of opiods. Son wants pt to return to [facility] with increase in services. Will follow acutely to maximize independence and mobility prior to discharge.     Follow Up Recommendations Home health PT;Supervision for mobility/OOB;Supervision/Assistance - 24 hour(return to [facility] as son wants her to return there)    Equipment Recommendations  Other (comment)(defer)    Recommendations for Other Services       Precautions / Restrictions Precautions Precautions: Fall Precaution Comments: sling for comfort Required Braces or Orthoses: Sling Restrictions Weight Bearing Restrictions: Yes Other Position/Activity Restrictions: WBAT and use of LUE as tolerated      Mobility  Bed  Mobility Overal bed mobility: Needs Assistance Bed Mobility: Supine to Sit;Sit to Supine     Supine to sit: Total assist;HOB elevated;+2 for physical assistance Sit to supine: Total assist;HOB elevated;+2 for physical assistance   General bed mobility comments: Total A to get into/out of bed; no initiation or assist. Limited by arousal.  Transfers                 General transfer comment: Deferred due to safety/alertness/arousal.  Ambulation/Gait                Stairs            Wheelchair Mobility    Modified Rankin (Stroke Patients Only)       Balance Overall balance assessment: Needs assistance Sitting-balance support: Feet supported;Single extremity supported Sitting balance-Leahy Scale: Poor Sitting balance - Comments: right lateral lean and posterior lean and not able to correct with cues Postural control: Posterior lean;Right lateral lean     Standing balance comment: Deferred today                             Pertinent Vitals/Pain Pain Assessment: Faces Faces Pain Scale: Hurts even more Pain Location: generalized with movement Pain Descriptors / Indicators: Grimacing;Moaning Pain Intervention(s): Repositioned;Premedicated before session;Monitored during session;Limited activity within patient's tolerance    Home Living Family/patient expects to be discharged to:: Other (Comment)([facility])     Type of Home: Assisted living Home Access: Level entry     Home Layout: One level Home Equipment: Wheelchair - manual      Prior Function Level of Independence: Needs assistance  Comments: Per son pt was able to get herself in and OOB, get from bed to W/C by herself, get from W/C to toilet by herself, self propel her W/C, and do most of her ADLs until this past month when she has fallen more     Hand Dominance   Dominant Hand: Right    Extremity/Trunk Assessment   Upper Extremity Assessment Upper  Extremity Assessment: Defer to OT evaluation    Lower Extremity Assessment Lower Extremity Assessment: Generalized weakness;RLE deficits/detail;LLE deficits/detail;Difficult to assess due to impaired cognition RLE Deficits / Details: No movement to command, able to kick RLE sitting EOB spontaneously. LLE Deficits / Details: No movement to command. Observed moving LLE in bed spontaneously.    Cervical / Trunk Assessment Cervical / Trunk Assessment: Kyphotic;Other exceptions Cervical / Trunk Exceptions: right lateral lean in bed and when sitting EOB.  Communication   Communication: HOH  Cognition Arousal/Alertness: Lethargic Behavior During Therapy: Flat affect Overall Cognitive Status: Impaired/Different from baseline Area of Impairment: Safety/judgement;Problem solving                         Safety/Judgement: Decreased awareness of safety;Decreased awareness of deficits   Problem Solving: Slow processing;Decreased initiation;Difficulty sequencing;Requires verbal cues;Requires tactile cues General Comments: Pt lethargic with right eye closed for most of session; upon opening pt with right gaze deviation. Not following any commands today. POor arousal. Moaning in pain with movement. Pt given dilaudid earlier in AM. Also noted to have right lateral lean and not able to correct to midline.      General Comments General comments (skin integrity, edema, etc.): Sp02 ranging between 85-90% on RA today; donned 02 at end of session.    Exercises     Assessment/Plan    PT Assessment Patient needs continued PT services  PT Problem List Decreased strength;Decreased mobility;Decreased safety awareness;Decreased knowledge of precautions;Decreased range of motion;Decreased activity tolerance;Decreased cognition;Cardiopulmonary status limiting activity;Pain;Decreased balance       PT Treatment Interventions Therapeutic activities;Therapeutic exercise;Patient/family  education;Wheelchair mobility training;Functional mobility training;Balance training;DME instruction;Neuromuscular re-education    PT Goals (Current goals can be found in the Care Plan section)  Acute Rehab PT Goals Patient Stated Goal: son--for pt to go back to [facility] PT Goal Formulation: With family Time For Goal Achievement: 01/12/20 Potential to Achieve Goals: Fair    Frequency Min 3X/week   Barriers to discharge        Co-evaluation PT/OT/SLP Co-Evaluation/Treatment: Yes Reason for Co-Treatment: Necessary to address cognition/behavior during functional activity;Complexity of the patient's impairments (multi-system involvement);For patient/therapist safety;To address functional/ADL transfers PT goals addressed during session: Mobility/safety with mobility;Balance;Strengthening/ROM         AM-PAC PT "6 Clicks" Mobility  Outcome Measure Help needed turning from your back to your side while in a flat bed without using bedrails?: Total Help needed moving from lying on your back to sitting on the side of a flat bed without using bedrails?: Total Help needed moving to and from a bed to a chair (including a wheelchair)?: Total Help needed standing up from a chair using your arms (e.g., wheelchair or bedside chair)?: Total Help needed to walk in hospital room?: Total Help needed climbing 3-5 steps with a railing? : Total 6 Click Score: 6    End of Session Equipment Utilized During Treatment: Oxygen Activity Tolerance: Patient limited by lethargy Patient left: in bed;with call bell/phone within reach;with bed alarm set;with family/visitor present;Other (comment)(ortho PA in room) Nurse Communication: Mobility  status;Other (comment)(change in status?) PT Visit Diagnosis: Muscle weakness (generalized) (M62.81)    Time: [id number] PT Time Calculation (min) (ACUTE ONLY): 23 min   Charges:   PT Evaluation $PT Eval Moderate Complexity: 1 Mod          Marisa Severin, PT,  DPT Acute Rehabilitation Services Pager [phone] Office [phone]      Marguarite Arbour A Sabra Heck 12/29/2019, 12:17 PM

## 2019-12-30 ENCOUNTER — Inpatient Hospital Stay (HOSPITAL_COMMUNITY): Payer: Medicare Other

## 2019-12-30 ENCOUNTER — Encounter: Payer: Self-pay | Admitting: *Deleted

## 2019-12-30 LAB — VITAMIN D 25 HYDROXY (VIT D DEFICIENCY, FRACTURES): Vit D, 25-Hydroxy: 21.92 ng/mL — ABNORMAL LOW (ref 30–100)

## 2019-12-30 LAB — URINALYSIS, ROUTINE W REFLEX MICROSCOPIC
Bilirubin Urine: NEGATIVE
Glucose, UA: NEGATIVE mg/dL
Ketones, ur: 5 mg/dL — AB
Nitrite: NEGATIVE
Protein, ur: 30 mg/dL — AB
Specific Gravity, Urine: 1.014 (ref 1.005–1.030)
WBC, UA: >50 WBC/hpf — ABNORMAL HIGH (ref 0–5)
pH: 6 (ref 5.0–8.0)

## 2019-12-30 LAB — BASIC METABOLIC PANEL
Anion gap: 10 (ref 5–15)
BUN: 51 mg/dL — ABNORMAL HIGH (ref 8–23)
CO2: 18 mmol/L — ABNORMAL LOW (ref 22–32)
Calcium: 8.4 mg/dL — ABNORMAL LOW (ref 8.9–10.3)
Chloride: 113 mmol/L — ABNORMAL HIGH (ref 98–111)
Creatinine, Ser: 2.29 mg/dL — ABNORMAL HIGH (ref 0.44–1.00)
GFR calc Af Amer: 21 mL/min — ABNORMAL LOW (ref >60)
GFR calc non Af Amer: 18 mL/min — ABNORMAL LOW (ref >60)
Glucose, Bld: 98 mg/dL (ref 70–99)
Potassium: 5.1 mmol/L (ref 3.5–5.1)
Sodium: 141 mmol/L (ref 135–145)

## 2019-12-30 LAB — CBC
HCT: 25.1 % — ABNORMAL LOW (ref 36.0–46.0)
Hemoglobin: 7.7 g/dL — ABNORMAL LOW (ref 12.0–15.0)
MCH: 35 pg — ABNORMAL HIGH (ref 26.0–34.0)
MCHC: 30.7 g/dL (ref 30.0–36.0)
MCV: 114.1 fL — ABNORMAL HIGH (ref 80.0–100.0)
Platelets: 182 10*3/uL (ref 150–400)
RBC: 2.2 MIL/uL — ABNORMAL LOW (ref 3.87–5.11)
RDW: 14.7 % (ref 11.5–15.5)
WBC: 12.6 10*3/uL — ABNORMAL HIGH (ref 4.0–10.5)
nRBC: 0.2 % (ref 0.0–0.2)

## 2019-12-30 LAB — PHOSPHORUS: Phosphorus: 3.9 mg/dL (ref 2.5–4.6)

## 2019-12-30 MED ORDER — CALCIUM CARBONATE-VITAMIN D 500-200 MG-UNIT PO TABS
1.0000 | ORAL_TABLET | Freq: Two times a day (BID) | ORAL | Status: DC
  Administered 2019-12-30 – 2020-01-02 (×7): 1 via ORAL
  Filled 2019-12-30 (×7): qty 1

## 2019-12-30 NOTE — Progress Notes (Signed)
Modified Barium Swallow Progress Note  Patient Details  Name: Shannon Joyce MRN: [id number] Date of Birth: 09-11-1925  Today's Date: 12/30/2019  Modified Barium Swallow completed.  Full report located under Chart Review in the Imaging Section.  Brief recommendations include the following:  Clinical Impression  Pt presents with moderately severe oropharyngeal dysphagia with resultant silent aspiration of thin liquid and nectar-thick liquid, deep laryngeal penetration with possible aspiration with honey-thick liquid, and stagnant trace laryngeal penetration of puree.    Laryngeal penetration and aspiration were related to generalized oropharyngeal weakness, premature spillage to the laryngeal vestibule, and delayed laryngeal closure.  Oral phase was remarkable for weak lingual manipulation, prolonged AP transport, reduced lingual control resulting in premature spillage to the pharynx, and reduced lingual strength resulting in oral residue.  Pharyngeal phase was remarkable for reduced BOT retraction and reduced hyolaryngeal elevation/excursion resulting in mod-severe vallecular residue.  Pt presented with severe vallecular residue with puree trials that she was unable to effectively clear despite multiple cued dry swallows.  Pt is at an elevated risk for aspiration with all PO intake at this time.    Showed pt's son this MBS playback and spoke with him in depth regarding MBS results.  Discussed NPO with consideration of alternative means of nutrition vs Dysphagia 1 (puree) solids and pudding thick liquids with known risk of aspiration and he opted for PO intake.  Therefore, recommend Dysphagia 1 (puree) solids and pudding thick liquids with known aspiration risk and strict adherence to the following compensatory strategies: 1) Small bites/sips 2) Slow rate of intake 3) Sit upright as possible 4) Remain upright for 30+ minutes after intake 5) Multiple dry swallows after each bite 6) Clear throat  intermittently.  Pt may have a few ice chips before or 30+ minutes after meals following through oral care with full supervision.    Swallow Evaluation Recommendations   Recommended Consults: Consider GI evaluation   SLP Diet Recommendations: Dysphagia 1 (Puree) solids;Pudding thick liquid   Liquid Administration via: Spoon   Medication Administration: Crushed with puree   Supervision: Full assist for feeding;Full supervision/cueing for compensatory strategies   Compensations: Slow rate;Small sips/bites;Minimize environmental distractions;Effortful swallow;Multiple dry swallows after each bite/sip;Clear throat intermittently   Postural Changes: Seated upright at 90 degrees;Remain semi-upright after after feeds/meals (Comment)   Oral Care Recommendations: Staff/trained caregiver to provide oral care;Oral care before and after PO;Oral care BID   Other Recommendations: Prohibited food (jello, ice cream, thin soups);Have oral suction available;Remove water pitcher   Colin Mulders M.S., CCC-SLP Acute Rehabilitation Services Office: [phone]  Elvia Collum Attila Mccarthy 12/30/2019,4:39 PM

## 2019-12-30 NOTE — Progress Notes (Signed)
Notified Dr Silas [place] that pt's pulse was 110 @ 1955, 105 @ 2200 and now 101. Pt was uncomfortable, repositioned and calmed down. Continued confusion. RN will continue to monitor patient.
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
## 2019-12-30 NOTE — Progress Notes (Signed)
  Speech Language Pathology Treatment: Dysphagia  Patient Details Name: Shannon Joyce MRN: [id number] DOB: 04-19-1925 Today's Date: 12/30/2019 Time: [id number] SLP Time Calculation (min) (ACUTE ONLY): 28 min  Assessment / Plan / Recommendation Clinical Impression  Pt was seen for skilled ST targeting diagnostic treatment and diet tolerance.  Pt was lethargic throughout this session and her son was present at bedside.  RN and son reported poor PO intake with intermittent coughing observed, particularly with medication and liquid consumption.  Pt consumed trials of thin liquid, nectar-thick liquid, and puree.  Pt was additionally observed with powdered medication mixed with thin liquid and small pill with liquid wash administered by RN.  Pt exhibited prolonged AP transport, multiple swallows per bolus, effortful swallows, and audible swallow with all trials.  She exhibited s/sx of aspiration characterized by immediate and delayed throat clearing and coughing with thin liquid trials.  She also exhibited an immediate cough with medications given with thin liquid.  Liquid medication was thickened to a nectar consistency and this appeared to eliminate clinical s/sx of aspiration.  Multiple swallows per bolus were still observed.  Additionally observed frequent eructation following the completion of PO trials.  Pt's son reported hx of GERD and RN stated that pt is currently on Protonix.  Concerns for aspiration with thin liquid and post prandial aspiration with all PO intake.  Recommend a MBS to further evaluate swallow function and to help determine safest/least restrictive diet.  Until MBS is completed, recommend diet change to Dysphagia 1 (puree) solids and nectar-thick liquids with full supervision to assist with feeding and to cue for compensatory strategies.    HPI HPI: 86 year old female admitted from SNF with displaced humerus fracture s/p repair 1/15. OT notes holding food in mouth during po intake. SLP  consulted.       SLP Plan  MBS       Recommendations  Diet recommendations: Dysphagia 1 (puree);Nectar-thick liquid Liquids provided via: Cup;Straw Medication Administration: Crushed with puree Supervision: Staff to assist with self feeding;Full supervision/cueing for compensatory strategies Compensations: Slow rate;Small sips/bites;Minimize environmental distractions;Effortful swallow Postural Changes and/or Swallow Maneuvers: Seated upright 90 degrees;Upright 30-60 min after meal                Oral Care Recommendations: Oral care BID;Oral care prior to ice chip/H20 Follow up Recommendations: Skilled Nursing facility SLP Visit Diagnosis: Dysphagia, unspecified (R13.10) Plan: MBS                     Colin Mulders., M.S., [facility] Office: [phone]  [place] 12/30/2019, 11:53 AM

## 2019-12-30 NOTE — Plan of Care (Signed)
  Problem: Pain Managment: Goal: General experience of comfort will improve Outcome: Progressing   Problem: Safety: Goal: Ability to remain free from injury will improve Outcome: Progressing   Problem: Skin Integrity: Goal: Risk for impaired skin integrity will decrease Outcome: Progressing   
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
## 2019-12-30 NOTE — Progress Notes (Signed)
   Vital Signs MEWS/VS Documentation      12/30/2019 0651 12/30/2019 0810 12/30/2019 1014 12/30/2019 1036   MEWS Score:  2  2  3  2    MEWS Score Color:  Yellow  Yellow  Yellow  Yellow   Resp:  --  17  16  --   Pulse:  91  (!) 102  (!) 102  100   BP:  --  (!) 110/39  (!) 100/44  --   Temp:  --  98.1 F (36.7 C)  97.9 F (36.6 C)  --   O2 Device:  Nasal Cannula  Room Air  Room Air  Room Air   Level of Consciousness:  --  --  Responds to Voice  --     Doctor notified ; no new orders      Janee Morn 12/30/2019,12:29 PM

## 2019-12-30 NOTE — Progress Notes (Signed)
PROGRESS NOTE    Shannon Joyce  [id number] DOB: [birth year] DOA: 12/27/2019 PCP: Patient, No Pcp Per  Outpatient Specialists:   Brief Narrative:  Patient is a 86 year old Caucasian female with past medical history significant for dementia, recurrent falls, CKD stage IV, hypertension and osteoarthritis.  Patient is a resident at an assisted living facility.  Patient was admitted to the hospital following a fall with associated oblique fracture of the midshaft of the left humerus.  Patient has undergone internal fixation.  Medical team was consulted to assist with medical management.  12/30/2019: Consultation note done by Dr. Wynetta Fines, hospitalist is noted.  Patient was seen alongside  patient's son.  Patient is a bit sleepy, but wakes up readily and follows command.  Low blood pressure was noted overnight, but none improving with hydration.  Poor p.o. intake was endorsed.  Acute kidney injury seems to be improving.  Serum creatinine has improved from 2.75 to 2.29.  We will continue to monitor renal function with hydration.  Speech evaluation is appreciated.  Caloric intake will need to be assessed.  Chest x-ray finding is noted.  Chest x-ray was done on 12/29/2019.  We will also order urinalysis.  Patient was on chronic antibiotics for UTI prophylaxis, but patient is not currently on any antibiotics.  Disposition will be pursued when patient is optimized.   Assessment & Plan:   Active Problems:   Fracture of humeral shaft, left, closed   Displaced fracture of shaft of left humerus   AKI (acute kidney injury) ([facility])  Acute kidney injury on CKD 4: -Acute kidney injury is resolving -Continue hydration -Further management will depend on hospital course  Chronic UTI: -Currently off antibiotics -Check urinalysis and urine culture  Hypotension: -Possible secondary to volume depletion due to poor p.o. intake -Continue IV normal saline -Hypotension has resolved.  Dementia: -Patient's son  tells me there is no official diagnosis of dementia -No behavioral problems -Supportive care -Continue to assessment patient is at baseline  Recurrent falls with recent left humeral fracture: -Status post internal fixation -Pussy disposition: Patient is stable  Poor p.o. intake/malnutrition/failure to thrive: -Caloric count, patient is able to take orally -Continue IV hydration -Consider dietary consult -Consider palliative care follow-up on discharge  Overall, long-term prognosis is guarded.  DVT prophylaxis: Subcutaneous Lovenox Code Status: DO NOT RESUSCITATE Family Communication: Son Disposition Plan: This will depend on hospital course  Procedures:   Internal fixation of left humeral fracture  Antimicrobials:   None   Subjective: No significant history from patient  Objective: Vitals:   12/30/19 0651 12/30/19 0810 12/30/19 1014 12/30/19 1036  BP:  (!) 110/39 (!) 100/44   Pulse: 91 (!) 102 (!) 102 100  Resp:  17 16   Temp:  98.1 F (36.7 C) 97.9 F (36.6 C)   TempSrc:   Oral   SpO2: 96% 91% (!) 82% 96%  Weight:      Height:        Intake/Output Summary (Last 24 hours) at 12/30/2019 1223 Last data filed at 12/30/2019 0900 Gross per 24 hour  Intake 479.78 ml  Output 250 ml  Net 229.78 ml   Filed Weights   12/27/19 0506  Weight: 52.2 kg    Examination:  General exam: Appears calm and comfortable.  Sleepy, but wakes easily and follows command Respiratory system: Clear to auscultation. Respiratory effort normal. Cardiovascular system: S1 & S2 heard, RRR.  Gastrointestinal system: Abdomen is nondistended, soft and nontender. No organomegaly or masses felt. Normal bowel  sounds heard. Central nervous system: Patient follows commands.   Extremities: No leg edema  Data Reviewed: I have personally reviewed following labs and imaging studies  CBC: Recent Labs  Lab 12/27/19 1124 12/28/19 0339 12/29/19 0747 12/30/19 0948  WBC 16.1* 13.9* 16.5* 12.6*    HGB 9.4* 7.6* 7.9* 7.7*  HCT 30.7* 24.6* 26.1* 25.1*  MCV 112.9* 112.3* 114.0* 114.1*  PLT 193 160 163 [id number]   Basic Metabolic Panel: Recent Labs  Lab 12/27/19 1124 12/28/19 0339 12/29/19 0747 12/30/19 0415  NA 144 138 140 141  K 5.1 5.7* 5.3* 5.1  CL 116* 112* 113* 113*  CO2 16* 16* 17* 18*  GLUCOSE 143* 148* 108* 98  BUN 45* 40* 52* 51*  CREATININE 2.75* 2.15* 2.58* 2.29*  CALCIUM 9.1 8.3* 8.4* 8.4*  PHOS  --   --   --  3.9   GFR: Estimated Creatinine Clearance: 12.9 mL/min (A) (by C-G formula based on SCr of 2.29 mg/dL (H)). Liver Function Tests: Recent Labs  Lab 12/27/19 1124  AST 23  ALT 13  ALKPHOS 551*  BILITOT 0.5  PROT 6.4*  ALBUMIN 2.8*   No results for input(s): LIPASE, AMYLASE in the last 168 hours. No results for input(s): AMMONIA in the last 168 hours. Coagulation Profile: Recent Labs  Lab 12/27/19 1124  INR 1.1   Cardiac Enzymes: No results for input(s): CKTOTAL, CKMB, CKMBINDEX, TROPONINI in the last 168 hours. BNP (last 3 results) No results for input(s): PROBNP in the last 8760 hours. HbA1C: No results for input(s): HGBA1C in the last 72 hours. CBG: No results for input(s): GLUCAP in the last 168 hours. Lipid Profile: No results for input(s): CHOL, HDL, LDLCALC, TRIG, CHOLHDL, LDLDIRECT in the last 72 hours. Thyroid Function Tests: No results for input(s): TSH, T4TOTAL, FREET4, T3FREE, THYROIDAB in the last 72 hours. Anemia Panel: No results for input(s): VITAMINB12, FOLATE, FERRITIN, TIBC, IRON, RETICCTPCT in the last 72 hours. Urine analysis: No results found for: COLORURINE, APPEARANCEUR, LABSPEC, PHURINE, GLUCOSEU, HGBUR, BILIRUBINUR, KETONESUR, PROTEINUR, UROBILINOGEN, NITRITE, LEUKOCYTESUR Sepsis Labs: @LABRCNTIP (procalcitonin:4,lacticidven:4)  ) Recent Results (from the past 240 hour(s))  Respiratory Panel by RT PCR (Flu A&B, Covid) - Nasopharyngeal Swab     Status: None   Collection Time: 12/27/19  6:38 AM   Specimen:  Nasopharyngeal Swab  Result Value Ref Range Status   SARS Coronavirus 2 by RT PCR NEGATIVE NEGATIVE Final    Comment: (NOTE) SARS-CoV-2 target nucleic acids are NOT DETECTED. The SARS-CoV-2 RNA is generally detectable in upper respiratoy specimens during the acute phase of infection. The lowest concentration of SARS-CoV-2 viral copies this assay can detect is 131 copies/mL. A negative result does not preclude SARS-Cov-2 infection and should not be used as the sole basis for treatment or other patient management decisions. A negative result may occur with  improper specimen collection/handling, submission of specimen other than nasopharyngeal swab, presence of viral mutation(s) within the areas targeted by this assay, and inadequate number of viral copies (<131 copies/mL). A negative result must be combined with clinical observations, patient history, and epidemiological information. The expected result is Negative. Fact Sheet for Patients:  PinkCheek.be Fact Sheet for Healthcare Providers:  GravelBags.it This test is not yet ap proved or cleared by the [place] FDA and  has been authorized for detection and/or diagnosis of SARS-CoV-2 by FDA under an Emergency Use Authorization (EUA). This EUA will remain  in effect (meaning this test can be used) for the duration of the COVID-19 declaration under  Section 564(b)(1) of the Act, 21 U.S.C. section 360bbb-3(b)(1), unless the authorization is terminated or revoked sooner.    Influenza A by PCR NEGATIVE NEGATIVE Final   Influenza B by PCR NEGATIVE NEGATIVE Final    Comment: (NOTE) The Xpert Xpress SARS-CoV-2/FLU/RSV assay is intended as an aid in  the diagnosis of influenza from Nasopharyngeal swab specimens and  should not be used as a sole basis for treatment. Nasal washings and  aspirates are unacceptable for Xpert Xpress SARS-CoV-2/FLU/RSV  testing. Fact Sheet for  Patients: PinkCheek.be Fact Sheet for Healthcare Providers: GravelBags.it This test is not yet approved or cleared by the [place] FDA and  has been authorized for detection and/or diagnosis of SARS-CoV-2 by  FDA under an Emergency Use Authorization (EUA). This EUA will remain  in effect (meaning this test can be used) for the duration of the  Covid-19 declaration under Section 564(b)(1) of the Act, 21  U.S.C. section 360bbb-3(b)(1), unless the authorization is  terminated or revoked. Performed at [facility], [place] [address] [place], [place] [address]   Surgical pcr screen     Status: None   Collection Time: 12/27/19  9:11 AM   Specimen: Nasal Mucosa; Nasal Swab  Result Value Ref Range Status   MRSA, PCR NEGATIVE NEGATIVE Final   Staphylococcus aureus NEGATIVE NEGATIVE Final    Comment: (NOTE) The Xpert SA Assay (FDA approved for NASAL specimens in patients 74 years of age and older), is one component of a comprehensive surveillance program. It is not intended to diagnose infection nor to guide or monitor treatment. Performed at [facility], [place] [address], [place], [place] [address]          Radiology Studies: DG Chest 1 View  Result Date: 12/29/2019 CLINICAL DATA:  Acute kidney injury. EXAM: CHEST  1 VIEW COMPARISON:  December 27, 2019 FINDINGS: Cardiomediastinal silhouette is normal. Mediastinal contours appear intact. Calcific atherosclerotic disease and tortuosity of the aorta. Left lower lobe linear airspace consolidation versus atelectasis or scarring. Chronic interstitial thickening. Osseous structures are without acute abnormality. Soft tissues are grossly normal. IMPRESSION: Left lower lobe linear airspace consolidation versus atelectasis or scarring. Chronic interstitial thickening. Electronically Signed   By: Fidela Salisbury M.D.   On: 12/29/2019 16:01   CT HEAD WO CONTRAST  Result  Date: 12/29/2019 CLINICAL DATA:  Neuro deficit. Stroke suspected. Right eye weakness. EXAM: CT HEAD WITHOUT CONTRAST TECHNIQUE: Contiguous axial images were obtained from the base of the skull through the vertex without intravenous contrast. COMPARISON:  None. 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 This infarct is age indeterminate. There is some progression of other periventricular white matter hypoattenuation bilaterally. A remote lacunar infarct of the right internal capsule is stable. Basal ganglia are otherwise within normal limits. The brainstem and cerebellum are within normal limits. The ventricles are proportionate to the degree of atrophy. No significant extraaxial fluid collection is present. Vascular: Atherosclerotic calcifications are present within the cavernous internal carotid arteries bilaterally. No hyperdense vessel is present. Skull: Calvarium is intact. No focal lytic or blastic lesions are present. No significant extracranial soft tissue lesion is present. Sinuses/Orbits: The paranasal sinuses and mastoid air cells are clear. The globes and orbits are within normal limits. ASPECTS [facility] Stroke Program Early CT Score) - Ganglionic level infarction (caudate, lentiform nuclei, internal capsule, insula, M1-M3 cortex): 6/7 - Supraganglionic infarction (M4-M6 cortex): 3/3 Total score (0-10 with 10  being normal): 9/10 IMPRESSION: 1. Age indeterminate infarct of the anterior right frontal lobe is likely remote. 2. If the right frontal lobe infarct is considered, aspects score is 9/10. 3. Slight progression of atrophy and white matter disease consistent with chronic microvascular ischemia otherwise. The above was relayed via text pager to Dr. Kerney Elbe on 12/29/2019 at 10:53 . Electronically Signed   By: San Morelle M.D.   On: 12/29/2019 10:53   MR BRAIN WO CONTRAST  Result Date: 12/29/2019 CLINICAL  DATA:  Confusion and agitation. EXAM: MRI HEAD WITHOUT CONTRAST TECHNIQUE: Multiplanar, multiecho pulse sequences of the brain and surrounding structures were obtained without intravenous contrast. COMPARISON:  Head CT same day FINDINGS: Brain: Diffusion imaging does not show any acute or subacute infarction. The study suffers from motion degradation. No lesion is seen affecting the brainstem or cerebellum. There is an old cortical and subcortical infarction in the right frontal lobe. This is not recent. Chronic small-vessel ischemic changes of the white matter elsewhere in the cerebral hemispheres. No sign of mass, hemorrhage, hydrocephalus or extra-axial collection. Vascular: Major vessels at the base of the brain show flow. Skull and upper cervical spine: Negative Sinuses/Orbits: Clear/normal Other: None IMPRESSION: Severely motion degraded exam. The right frontal infarction described at CT is clearly old based on the MRI. No other acute or reversible finding. Mild chronic small-vessel change of the white matter elsewhere. Electronically Signed   By: Nelson Chimes M.D.   On: 12/29/2019 14:00        Scheduled Meds: . calcium-vitamin D  1 tablet Oral BID  . docusate sodium  100 mg Oral BID  . enoxaparin (LOVENOX) injection  30 mg Subcutaneous Q24H  . levothyroxine  12.5 mcg Oral Daily  . mirtazapine  7.5 mg Oral QHS  . pantoprazole  40 mg Oral Daily  . sodium bicarbonate  650 mg Oral BID  . sodium zirconium cyclosilicate  5 g Oral Daily   Continuous Infusions: . lactated ringers 75 mL/hr at 12/29/19 2310     LOS: 2 days    Time spent: 35 minutes    Dana Allan, MD  [facility] Pager #: [phone] 7PM-7AM contact night coverage as above

## 2019-12-31 LAB — CBC WITH DIFFERENTIAL/PLATELET
Abs Immature Granulocytes: 0.07 10*3/uL (ref 0.00–0.07)
Basophils Absolute: 0.1 10*3/uL (ref 0.0–0.1)
Basophils Relative: 1 %
Eosinophils Absolute: 0.3 10*3/uL (ref 0.0–0.5)
Eosinophils Relative: 3 %
HCT: 27.7 % — ABNORMAL LOW (ref 36.0–46.0)
Hemoglobin: 8.2 g/dL — ABNORMAL LOW (ref 12.0–15.0)
Immature Granulocytes: 1 %
Lymphocytes Relative: 11 %
Lymphs Abs: 1.3 10*3/uL (ref 0.7–4.0)
MCH: 33.7 pg (ref 26.0–34.0)
MCHC: 29.6 g/dL — ABNORMAL LOW (ref 30.0–36.0)
MCV: 114 fL — ABNORMAL HIGH (ref 80.0–100.0)
Monocytes Absolute: 1.3 10*3/uL — ABNORMAL HIGH (ref 0.1–1.0)
Monocytes Relative: 12 %
Neutro Abs: 8.2 10*3/uL — ABNORMAL HIGH (ref 1.7–7.7)
Neutrophils Relative %: 72 %
Platelets: 183 10*3/uL (ref 150–400)
RBC: 2.43 MIL/uL — ABNORMAL LOW (ref 3.87–5.11)
RDW: 14.6 % (ref 11.5–15.5)
WBC: 11.2 10*3/uL — ABNORMAL HIGH (ref 4.0–10.5)
nRBC: 0.4 % — ABNORMAL HIGH (ref 0.0–0.2)

## 2019-12-31 LAB — RENAL FUNCTION PANEL
Albumin: 2.3 g/dL — ABNORMAL LOW (ref 3.5–5.0)
Anion gap: 11 (ref 5–15)
BUN: 41 mg/dL — ABNORMAL HIGH (ref 8–23)
CO2: 19 mmol/L — ABNORMAL LOW (ref 22–32)
Calcium: 8.3 mg/dL — ABNORMAL LOW (ref 8.9–10.3)
Chloride: 114 mmol/L — ABNORMAL HIGH (ref 98–111)
Creatinine, Ser: 1.83 mg/dL — ABNORMAL HIGH (ref 0.44–1.00)
GFR calc Af Amer: 27 mL/min — ABNORMAL LOW (ref >60)
GFR calc non Af Amer: 24 mL/min — ABNORMAL LOW (ref >60)
Glucose, Bld: 89 mg/dL (ref 70–99)
Phosphorus: 2.8 mg/dL (ref 2.5–4.6)
Potassium: 4.4 mmol/L (ref 3.5–5.1)
Sodium: 144 mmol/L (ref 135–145)

## 2019-12-31 LAB — MAGNESIUM: Magnesium: 2 mg/dL (ref 1.7–2.4)

## 2019-12-31 MED ORDER — STARCH (THICKENING) PO POWD
ORAL | Status: DC | PRN
  Filled 2019-12-31: qty 227

## 2019-12-31 MED ORDER — METOPROLOL TARTRATE 25 MG PO TABS
12.5000 mg | ORAL_TABLET | Freq: Two times a day (BID) | ORAL | Status: DC
  Administered 2019-12-31 – 2020-01-02 (×5): 12.5 mg via ORAL
  Filled 2019-12-31 (×5): qty 1

## 2019-12-31 NOTE — Progress Notes (Signed)
MEWs= 2  Not an acute change Vitals: Temp: 98.3 BP: 115/63 Pulse: 115 Respirations: 17 Oxygen: 91  [facility] on-call MD notified, will continue to monitor.  MEWS Guidelines - (patients age 84 and over)  Red - At High Risk for Deterioration Yellow - At risk for Deterioration  1. Go to room and assess patient 2. Validate data. Is this patient's baseline? If data confirmed: 3. Is this an acute change? 4. Administer prn meds/treatments as ordered. 5. Note Sepsis score 6. Review goals of care 7. Sports coach, RRT nurse and Provider. 8. Ask Provider to come to bedside.  9. Document patient condition/interventions/response. 10. Increase frequency of vital signs and focused assessments to at least q15 minutes x 4, then q30 minutes x2. - If stable, then q1h x3, then q4h x3 and then q8h or dept. routine. - If unstable, contact Provider & RRT nurse. Prepare for possible transfer. 11. Add entry in progress notes using the smart phrase ".MEWS". 1. Go to room and assess patient 2. Validate data. Is this patient's baseline? If data confirmed: 3. Is this an acute change? 4. Administer prn meds/treatments as ordered? 5. Note Sepsis score 6. Review goals of care 7. Sports coach and Provider 8. Call RRT nurse as needed. 9. Document patient condition/interventions/response. 10. Increase frequency of vital signs and focused assessments to at least q2h x2. - If stable, then q4h x2 and then q8h or dept. routine. - If unstable, contact Provider & RRT nurse. Prepare for possible transfer. 11. Add entry in progress notes using the smart phrase ".MEWS".  Green - Likely stable Lavender - Comfort Care Only  1. Continue routine/ordered monitoring.  2. Review goals of care. 1. Continue routine/ordered monitoring. 2. Review goals of care.

## 2019-12-31 NOTE — Progress Notes (Signed)
Occupational Therapy Treatment Patient Details Name: Shannon Joyce MRN: [id number] DOB: [birth year] Today's Date: 12/31/2019    History of present illness Shannon Joyce is a 86 y.o. female with hypertension. She has been residing at [facility] following a right femur fracture in January of 2019 and has been non-ambulatory since this injury--but has been able to transfer herself in and out of W/C and get around in W/C by herself. She requires a wheelchair. Pt has unwitnessed fall with resultant displaced humerus fracture and now s/p ORIF humeral shaft   OT comments  Pt progressing with LUE HEP performed by OTR. Pt with edematous LUE and placed on pillows in order to decrease edema. Pt tolerating with very little groaning other than in elbow extension. Mostly PROM performed as pt not following commands for each movement. PT set-upA for self feeding. Supervision provided. Pt would benefit from continued OT skilled services for ADL, mobility and LUE HEP. OT following acutely.    Follow Up Recommendations  Home health OT;Supervision/Assistance - 24 hour(return to residence)    Equipment Recommendations  3 in 1 bedside commode    Recommendations for Other Services      Precautions / Restrictions Precautions Precautions: Fall Precaution Comments: sling for comfort Required Braces or Orthoses: Sling Restrictions Weight Bearing Restrictions: Yes Other Position/Activity Restrictions: WBAT and use of LUE as tolerated       Mobility Bed Mobility               General bed mobility comments: deferred to focus on LUE  Transfers                 General transfer comment: deferred to focus on LUE    Balance                                           ADL either performed or assessed with clinical judgement   ADL Overall ADL's : Needs assistance/impaired Eating/Feeding: Set up;Bed level Eating/Feeding Details (indicate cue type and reason): Pt given spoon, pt able to  bring food to mouth with 100% accuracy and when spoon had large amounts, pt took a smaller bite than what was on the spoon.                                 Functional mobility during ADLs: Maximal assistance;Total assistance;+2 for physical assistance;+2 for safety/equipment General ADL Comments: Pt nearly totalA for ADL, but set-upA for feeding self.     Vision       Perception     Praxis      Cognition Arousal/Alertness: Awake/alert Behavior During Therapy: WFL for tasks assessed/performed Overall Cognitive Status: Impaired/Different from baseline Area of Impairment: Following commands;Safety/judgement;Problem solving                       Following Commands: Follows one step commands inconsistently Safety/Judgement: Decreased awareness of safety;Decreased awareness of deficits   Problem Solving: Slow processing;Decreased initiation;Difficulty sequencing;Requires verbal cues;Requires tactile cues General Comments: Pt's son in room and reports "I'm usually able to have a conversation with her, but not in here. She is acting more confused."        Exercises Exercises: Other exercises Other Exercises Other Exercises: PROM to shoulder through digits; moans with elbow extension- unable to get full extension (170*)  Shoulder Instructions       General Comments LUE with increased edema; placed on pillows and elevated in bed    Pertinent Vitals/ Pain       Pain Assessment: Faces Faces Pain Scale: Hurts a little bit Pain Location: movements with LUE at elbow Pain Descriptors / Indicators: Grimacing Pain Intervention(s): Limited activity within patient's tolerance  Home Living                                          Prior Functioning/Environment              Frequency  Min 3X/week        Progress Toward Goals  OT Goals(current goals can now be found in the care plan section)  Progress towards OT goals: Progressing  toward goals  Acute Rehab OT Goals Patient Stated Goal: son--for pt to go back to [facility] OT Goal Formulation: With family Time For Goal Achievement: 01/11/20 Potential to Achieve Goals: Fair ADL Goals Pt Will Perform Grooming: with set-up Pt Will Transfer to Toilet: with min assist;squat pivot transfer;bedside commode Pt/caregiver will Perform Home Exercise Program: Increased ROM;Increased strength;Left upper extremity;With written HEP provided;With minimal assist Additional ADL Goal #1: Pt will be S in and OOB for basic ADLs  Plan Discharge plan remains appropriate    Co-evaluation                 AM-PAC OT "6 Clicks" Daily Activity     Outcome Measure   Help from another person eating meals?: A Little Help from another person taking care of personal grooming?: Total Help from another person toileting, which includes using toliet, bedpan, or urinal?: Total Help from another person bathing (including washing, rinsing, drying)?: Total Help from another person to put on and taking off regular upper body clothing?: Total Help from another person to put on and taking off regular lower body clothing?: Total 6 Click Score: 8    End of Session    OT Visit Diagnosis: Unsteadiness on feet (R26.81);Other abnormalities of gait and mobility (R26.89);Repeated falls (R29.6);History of falling (Z91.81);Pain;Other symptoms and signs involving cognitive function Pain - Right/Left: Left Pain - part of body: Arm   Activity Tolerance Patient tolerated treatment well   Patient Left in bed;with call bell/phone within reach;with bed alarm set   Nurse Communication Mobility status        Time: [id number] OT Time Calculation (min): 22 min  Charges: OT General Charges $OT Visit: 1 Visit OT Treatments $Therapeutic Exercise: 8-22 mins  Jefferey Pica OTR/L Acute Rehabilitation Services Pager: [phone] Office: [phone]    Velda Wendt C 12/31/2019, 3:50 PM

## 2019-12-31 NOTE — Plan of Care (Signed)

## 2019-12-31 NOTE — Progress Notes (Signed)
Subjective: Patient is status post ORIF left humerus fracture on 1/15 by Dr. Griffin Basil. She is resting in her hospital bed in no acute distress. She says her right leg and left arm feel better today.    Objective:   VITALS:   Vitals:   12/31/19 0015 12/31/19 0404 12/31/19 0615 12/31/19 0757  BP: 119/60 115/63 110/60 (!) 149/73  Pulse: (!) 107 (!) 115 (!) 110 (!) 114  Resp: 18 17 16 16   Temp: 98.2 F (36.8 C) 98.3 F (36.8 C) 98.2 F (36.8 C) 98.2 F (36.8 C)  TempSrc: Oral  Oral Oral  SpO2: 94% 91% 96% 95%  Weight:      Height:       CBC Latest Ref Rng & Units 12/31/2019 12/30/2019 12/29/2019  WBC 4.0 - 10.5 K/uL 11.2(H) 12.6(H) 16.5(H)  Hemoglobin 12.0 - 15.0 g/dL 8.2(L) 7.7(L) 7.9(L)  Hematocrit 36.0 - 46.0 % 27.7(L) 25.1(L) 26.1(L)  Platelets 150 - 400 K/uL 183 182 163   BMP Latest Ref Rng & Units 12/31/2019 12/30/2019 12/29/2019  Glucose 70 - 99 mg/dL 89 98 108(H)  BUN 8 - 23 mg/dL 41(H) 51(H) 52(H)  Creatinine 0.44 - 1.00 mg/dL 1.83(H) 2.29(H) 2.58(H)  Sodium 135 - 145 mmol/L 144 141 140  Potassium 3.5 - 5.1 mmol/L 4.4 5.1 5.3(H)  Chloride 98 - 111 mmol/L 114(H) 113(H) 113(H)  CO2 22 - 32 mmol/L 19(L) 18(L) 17(L)  Calcium 8.9 - 10.3 mg/dL 8.3(L) 8.4(L) 8.4(L)   Intake/Output      01/18 0701 - 01/19 0700 01/19 0701 - 01/20 0700   P.O. 120    I.V. (mL/kg)     Total Intake(mL/kg) 120 (2.3)    Urine (mL/kg/hr) 650 (0.5)    Total Output 650    Net -530         Urine Occurrence 1 x      Physical Exam: General: Elderly female resting comfortably in hospital bed, NAD Resp: Respiratory effort normal Cardio: No pedal edema ABD: soft Neurological: She follows some commands, keeps her eyes closed most of the time MSK: LUE: Dressing C/D/I. Some ecchymosis surrounding distal half of incision site. ROM limited due to patient's cooperation this morning. Some swelling about the left hand. She is able to wiggle her fingers. She has been able to demonstrate + Motor for ulnar  distributions, rest of motor examination was limited due to her cooperation. She endorses sensation about medial, radial, and ulnar distributions. Well perfused digits.  LLE: She can actively extend and flex knee, intact EHL/TA/GSC, compartments soft, no pain with passive stretch RLE: some TTP about hip and knee, no pain about calf, she moves her ankle and foot without pain, compartments soft, she endorses sensation distally   Assessment / Plan: 4 Days Post-Op  S/P Procedure(s) (LRB): OPEN REDUCTION INTERNAL FIXATION (ORIF) HUMERAL SHAFT FRACTURE (Left) by Dr. Griffin Basil on 12/26/2018   Active Problems:   Fracture of humeral shaft, left, closed   Displaced fracture of shaft of left humerus   AKI (acute kidney injury) ([place])   Left humeral shaft fracture, status post ORIF: - WBAT LUE - Mepilex dressing. Reinforce dressing as needed.  - Incentive spirometry  Right leg pain: X-rays of right hip and knee negative for acute bony abnormalities. Pain seems to be chronic according to chart review. No further treatment at this time.   Delirium: Neurology work-up negative for acute stroke. Appreciate consultation. Likely post-op confusion following surgery in combination with age, underlying cognitive impairment, narcotic effect, renal  failure.  Dysphagia: Appreciate speech pathology evaluation and advice. Per SLP note:  "Diet Recommendations: Dysphagia 1 (Puree) solids;Pudding thick liquid. Liquid Administration via: Spoon. Medication Administration: Crushed with puree. Supervision: Full assist for feeding;Full supervision/cueing for compensatory strategies."  Acute kidney injury: seems to be improving. Serum creatinine down to 1.83 from 2.29.   Medical co-management: Appreciate Hospitalist team consultation and advice. Per Hospitalist, Dr. Hollice Gong, note: "She is medically stable and does not need further inpatient acute care for her medical issues. I would recommend to recheck her renal  function within one week, as it is improving with hydration. I would also discharge her on Metoprolol BID with holding parameters as currently ordered."  VTE prophylaxis: Lovenox, SCDs, ambulation  Pain control: Minimize narcotics.  Tylenol.  Oxycodone as needed. Dilaudid has been discontinued as patient had increased delirium after receiving medication.   Dispo:  Patient has remained inpatient for medical issues and post-discharge care arrangement. PT recommending 24 hour assistance - SNF or back to [facility] if they can provide max assist and manage her care. Patient's son would like patient to return back to [facility]. Case management is following. Disposition pending arrangement for post-discharge care/SNF availability.      Ethelda Chick, PA-C 12/31/2019, 8:58 AM

## 2019-12-31 NOTE — Progress Notes (Addendum)
PROGRESS NOTE    Shannon Joyce  [id number] DOB: 1925/01/29 DOA: 12/27/2019 PCP: Patient, No Pcp Per  Outpatient Specialists:   Brief Narrative:  Patient is a 86 year old Caucasian female with past medical history significant for dementia, recurrent falls, CKD stage IV, hypertension and osteoarthritis.  Patient is a resident at an assisted living facility.  Patient was admitted to the hospital following a fall with associated oblique fracture of the midshaft of the left humerus.  Patient has undergone internal fixation.  Medical team was consulted to assist with medical management.  12/30/2019: Consultation note done by Dr. Wynetta Fines, hospitalist is noted.  Patient was seen alongside  patient's son.  Patient is a bit sleepy, but wakes up readily and follows command.  Low blood pressure was noted overnight, but none improving with hydration.  Poor p.o. intake was endorsed.  Acute kidney injury seems to be improving.  Serum creatinine has improved from 2.75 to 2.29.  We will continue to monitor renal function with hydration.  Speech evaluation is appreciated.  Caloric intake will need to be assessed.  Chest x-ray finding is noted.  Chest x-ray was done on 12/29/2019.  We will also order urinalysis.  Patient was on chronic antibiotics for UTI prophylaxis, but patient is not currently on any antibiotics.  Disposition will be pursued when patient is optimized.  12/31/19: Seen without family present, she has no complaints other than some L arm soreness, resting in bed comfortably. She has been tachycardic and hypertensive overnight. Will start low dose BID Metoprolol. Renal function is improved.  Assessment & Plan:   Active Problems:   Fracture of humeral shaft, left, closed   Displaced fracture of shaft of left humerus   AKI (acute kidney injury) ([place])  Acute kidney injury on CKD 4: -Acute kidney injury is resolving -Continue hydration, but will slow it down. -Further management will depend on  hospital course  Chronic UTI: -Currently off antibiotics, denies symptoms -Check urinalysis and urine culture (pending today)  Hypotension: -Possible secondary to volume depletion due to poor p.o. intake -Continue IV LR at 50/hr -Hypotension has resolved.  Dementia: -Patient's son tells me there is no official diagnosis of dementia -No behavioral problems -Supportive care -Continue to assessment patient is at baseline  Recurrent falls with recent left humeral fracture: -Status post internal fixation -Pussy disposition: Patient is stable  Poor p.o. intake/malnutrition/failure to thrive: -Caloric count, patient is able to take orally -Continue IV hydration -Consider dietary consult -Consider palliative care follow-up on discharge  Overall, long-term prognosis is guarded.  DVT prophylaxis: Subcutaneous Lovenox Code Status: DO NOT RESUSCITATE Family Communication: None today  Disposition Plan: She is medically stable and does not need further inpatient acute care for her medical issues. I would recommend to recheck her renal function within one week, as it is improving with hydration. I would also discharge her on Metoprolol BID with holding parameters as currently ordered.  Procedures:   Internal fixation of left humeral fracture  Antimicrobials:   None  Subjective: No significant history from patient, some L arm pain otherwise no complaints  Objective: Vitals:   12/31/19 0015 12/31/19 0404 12/31/19 0615 12/31/19 0757  BP: 119/60 115/63 110/60 (!) 149/73  Pulse: (!) 107 (!) 115 (!) 110 (!) 114  Resp: 18 17 16 16   Temp: 98.2 F (36.8 C) 98.3 F (36.8 C) 98.2 F (36.8 C) 98.2 F (36.8 C)  TempSrc: Oral  Oral Oral  SpO2: 94% 91% 96% 95%  Weight:  Height:        Intake/Output Summary (Last 24 hours) at 12/31/2019 0922 Last data filed at 12/31/2019 0404 Gross per 24 hour  Intake --  Output 650 ml  Net -650 ml   Filed Weights   12/27/19 0506  Weight:  52.2 kg    Examination:  General exam: Appears calm and comfortable.  Sleepy, but wakes easily and follows command Respiratory system: Clear to auscultation. Respiratory effort normal. Cardiovascular system: S1 & S2 heard, RRR.  Gastrointestinal system: Abdomen is nondistended, soft and nontender. No organomegaly or masses felt. Normal bowel sounds heard. Central nervous system: Patient follows commands.   Extremities: No leg edema, some left arm edema will ask RN to elevate  Data Reviewed: I have personally reviewed following labs and imaging studies  CBC: Recent Labs  Lab 12/27/19 1124 12/28/19 0339 12/29/19 0747 12/30/19 0948 12/31/19 0720  WBC 16.1* 13.9* 16.5* 12.6* 11.2*  NEUTROABS  --   --   --   --  8.2*  HGB 9.4* 7.6* 7.9* 7.7* 8.2*  HCT 30.7* 24.6* 26.1* 25.1* 27.7*  MCV 112.9* 112.3* 114.0* 114.1* 114.0*  PLT 193 160 163 182 [id number]   Basic Metabolic Panel: Recent Labs  Lab 12/27/19 1124 12/28/19 0339 12/29/19 0747 12/30/19 0415 12/31/19 0720  NA 144 138 140 141 144  K 5.1 5.7* 5.3* 5.1 4.4  CL 116* 112* 113* 113* 114*  CO2 16* 16* 17* 18* 19*  GLUCOSE 143* 148* 108* 98 89  BUN 45* 40* 52* 51* 41*  CREATININE 2.75* 2.15* 2.58* 2.29* 1.83*  CALCIUM 9.1 8.3* 8.4* 8.4* 8.3*  MG  --   --   --   --  2.0  PHOS  --   --   --  3.9 2.8   GFR: Estimated Creatinine Clearance: 16.2 mL/min (A) (by C-G formula based on SCr of 1.83 mg/dL (H)). Liver Function Tests: Recent Labs  Lab 12/27/19 1124 12/31/19 0720  AST 23  --   ALT 13  --   ALKPHOS 551*  --   BILITOT 0.5  --   PROT 6.4*  --   ALBUMIN 2.8* 2.3*   No results for input(s): LIPASE, AMYLASE in the last 168 hours. No results for input(s): AMMONIA in the last 168 hours. Coagulation Profile: Recent Labs  Lab 12/27/19 1124  INR 1.1   Cardiac Enzymes: No results for input(s): CKTOTAL, CKMB, CKMBINDEX, TROPONINI in the last 168 hours. BNP (last 3 results) No results for input(s): PROBNP in the last  8760 hours. HbA1C: No results for input(s): HGBA1C in the last 72 hours. CBG: No results for input(s): GLUCAP in the last 168 hours. Lipid Profile: No results for input(s): CHOL, HDL, LDLCALC, TRIG, CHOLHDL, LDLDIRECT in the last 72 hours. Thyroid Function Tests: No results for input(s): TSH, T4TOTAL, FREET4, T3FREE, THYROIDAB in the last 72 hours. Anemia Panel: No results for input(s): VITAMINB12, FOLATE, FERRITIN, TIBC, IRON, RETICCTPCT in the last 72 hours. Urine analysis:    Component Value Date/Time   COLORURINE YELLOW 12/30/2019 1419   APPEARANCEUR CLOUDY (A) 12/30/2019 1419   LABSPEC 1.014 12/30/2019 1419   PHURINE 6.0 12/30/2019 1419   GLUCOSEU NEGATIVE 12/30/2019 1419   HGBUR MODERATE (A) 12/30/2019 1419   BILIRUBINUR NEGATIVE 12/30/2019 1419   KETONESUR 5 (A) 12/30/2019 1419   PROTEINUR 30 (A) 12/30/2019 1419   NITRITE NEGATIVE 12/30/2019 1419   LEUKOCYTESUR LARGE (A) 12/30/2019 1419   Sepsis Labs: @LABRCNTIP (procalcitonin:4,lacticidven:4)  ) Recent Results (from the past 240 hour(s))  Respiratory Panel by RT PCR (Flu A&B, Covid) - Nasopharyngeal Swab     Status: None   Collection Time: 12/27/19  6:38 AM   Specimen: Nasopharyngeal Swab  Result Value Ref Range Status   SARS Coronavirus 2 by RT PCR NEGATIVE NEGATIVE Final    Comment: (NOTE) SARS-CoV-2 target nucleic acids are NOT DETECTED. The SARS-CoV-2 RNA is generally detectable in upper respiratoy specimens during the acute phase of infection. The lowest concentration of SARS-CoV-2 viral copies this assay can detect is 131 copies/mL. A negative result does not preclude SARS-Cov-2 infection and should not be used as the sole basis for treatment or other patient management decisions. A negative result may occur with  improper specimen collection/handling, submission of specimen other than nasopharyngeal swab, presence of viral mutation(s) within the areas targeted by this assay, and inadequate number of viral  copies (<131 copies/mL). A negative result must be combined with clinical observations, patient history, and epidemiological information. The expected result is Negative. Fact Sheet for Patients:  PinkCheek.be Fact Sheet for Healthcare Providers:  GravelBags.it This test is not yet ap proved or cleared by the [place] FDA and  has been authorized for detection and/or diagnosis of SARS-CoV-2 by FDA under an Emergency Use Authorization (EUA). This EUA will remain  in effect (meaning this test can be used) for the duration of the COVID-19 declaration under Section 564(b)(1) of the Act, 21 U.S.C. section 360bbb-3(b)(1), unless the authorization is terminated or revoked sooner.    Influenza A by PCR NEGATIVE NEGATIVE Final   Influenza B by PCR NEGATIVE NEGATIVE Final    Comment: (NOTE) The Xpert Xpress SARS-CoV-2/FLU/RSV assay is intended as an aid in  the diagnosis of influenza from Nasopharyngeal swab specimens and  should not be used as a sole basis for treatment. Nasal washings and  aspirates are unacceptable for Xpert Xpress SARS-CoV-2/FLU/RSV  testing. Fact Sheet for Patients: PinkCheek.be Fact Sheet for Healthcare Providers: GravelBags.it This test is not yet approved or cleared by the [place] FDA and  has been authorized for detection and/or diagnosis of SARS-CoV-2 by  FDA under an Emergency Use Authorization (EUA). This EUA will remain  in effect (meaning this test can be used) for the duration of the  Covid-19 declaration under Section 564(b)(1) of the Act, 21  U.S.C. section 360bbb-3(b)(1), unless the authorization is  terminated or revoked. Performed at [facility], [place] [address], [place], [place] [address]   Surgical pcr screen     Status: None   Collection Time: 12/27/19  9:11 AM   Specimen: Nasal Mucosa; Nasal Swab  Result Value Ref  Range Status   MRSA, PCR NEGATIVE NEGATIVE Final   Staphylococcus aureus NEGATIVE NEGATIVE Final    Comment: (NOTE) The Xpert SA Assay (FDA approved for NASAL specimens in patients 42 years of age and older), is one component of a comprehensive surveillance program. It is not intended to diagnose infection nor to guide or monitor treatment. Performed at [facility], [place] [address] [place], [place] [address]          Radiology Studies: DG Chest 1 View  Result Date: 12/29/2019 CLINICAL DATA:  Acute kidney injury. EXAM: CHEST  1 VIEW COMPARISON:  December 27, 2019 FINDINGS: Cardiomediastinal silhouette is normal. Mediastinal contours appear intact. Calcific atherosclerotic disease and tortuosity of the aorta. Left lower lobe linear airspace consolidation versus atelectasis or scarring. Chronic interstitial thickening. Osseous structures are without acute abnormality. Soft tissues are grossly normal. IMPRESSION: Left  lower lobe linear airspace consolidation versus atelectasis or scarring. Chronic interstitial thickening. Electronically Signed   By: Fidela Salisbury M.D.   On: 12/29/2019 16:01   CT HEAD WO CONTRAST  Result Date: 12/29/2019 CLINICAL DATA:  Neuro deficit. Stroke suspected. Right eye weakness. EXAM: CT HEAD WITHOUT CONTRAST TECHNIQUE: Contiguous axial images were obtained from the base of the skull through the vertex without intravenous contrast. COMPARISON:  None. 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 This infarct is age indeterminate. There is some progression of other periventricular white matter hypoattenuation bilaterally. A remote lacunar infarct of the right internal capsule is stable. Basal ganglia are otherwise within normal limits. The brainstem and cerebellum are within normal limits. The ventricles are proportionate to the degree of atrophy. No significant extraaxial fluid  collection is present. Vascular: Atherosclerotic calcifications are present within the cavernous internal carotid arteries bilaterally. No hyperdense vessel is present. Skull: Calvarium is intact. No focal lytic or blastic lesions are present. No significant extracranial soft tissue lesion is present. Sinuses/Orbits: The paranasal sinuses and mastoid air cells are clear. The globes and orbits are within normal limits. ASPECTS [facility] Stroke Program Early CT Score) - Ganglionic level infarction (caudate, lentiform nuclei, internal capsule, insula, M1-M3 cortex): 6/7 - Supraganglionic infarction (M4-M6 cortex): 3/3 Total score (0-10 with 10 being normal): 9/10 IMPRESSION: 1. Age indeterminate infarct of the anterior right frontal lobe is likely remote. 2. If the right frontal lobe infarct is considered, aspects score is 9/10. 3. Slight progression of atrophy and white matter disease consistent with chronic microvascular ischemia otherwise. The above was relayed via text pager to Dr. Kerney Elbe on 12/29/2019 at 10:53 . Electronically Signed   By: San Morelle M.D.   On: 12/29/2019 10:53   MR BRAIN WO CONTRAST  Result Date: 12/29/2019 CLINICAL DATA:  Confusion and agitation. EXAM: MRI HEAD WITHOUT CONTRAST TECHNIQUE: Multiplanar, multiecho pulse sequences of the brain and surrounding structures were obtained without intravenous contrast. COMPARISON:  Head CT same day FINDINGS: Brain: Diffusion imaging does not show any acute or subacute infarction. The study suffers from motion degradation. No lesion is seen affecting the brainstem or cerebellum. There is an old cortical and subcortical infarction in the right frontal lobe. This is not recent. Chronic small-vessel ischemic changes of the white matter elsewhere in the cerebral hemispheres. No sign of mass, hemorrhage, hydrocephalus or extra-axial collection. Vascular: Major vessels at the base of the brain show flow. Skull and upper cervical spine: Negative  Sinuses/Orbits: Clear/normal Other: None IMPRESSION: Severely motion degraded exam. The right frontal infarction described at CT is clearly old based on the MRI. No other acute or reversible finding. Mild chronic small-vessel change of the white matter elsewhere. Electronically Signed   By: Nelson Chimes M.D.   On: 12/29/2019 14:00   DG Knee Right Port  Result Date: 12/30/2019 CLINICAL DATA:  Right knee pain.  No known injury. EXAM: PORTABLE RIGHT KNEE - 1-2 VIEW COMPARISON:  None. FINDINGS: There is no acute bony or joint abnormality. Bones are markedly osteopenic. There is partial visualization of an intramedullary nail in the distal femur and plate and screws in the proximal tibia for fracture fixation. Remote healed fracture of the proximal diaphysis of the fibula is noted. IMPRESSION: No acute abnormality. Hardware for fracture fixation in the femur and tibia. Healed fracture of the proximal fibula noted. Severe osteopenia. Electronically Signed   By: Inge Rise M.D.  On: 12/30/2019 14:27   DG Swallowing Func-Speech Pathology  Result Date: 12/30/2019 Objective Swallowing Evaluation: Type of Study: MBS-Modified Barium Swallow Study  Patient Details Name: Favor Massoth MRN: [id number] Date of Birth: 11/23/1925 Today's Date: 12/30/2019 Time: SLP Start Time (ACUTE ONLY): 1330 -SLP Stop Time (ACUTE ONLY): 1349 SLP Time Calculation (min) (ACUTE ONLY): 19 min Past Medical History: Past Medical History: Diagnosis Date . Chronic kidney disease  . Hypertension  . UTI (lower urinary tract infection)  Past Surgical History: Past Surgical History: Procedure Laterality Date . ABDOMINAL HYSTERECTOMY   . CHOLECYSTECTOMY   . ORIF HUMERUS FRACTURE Left 12/27/2019  Procedure: OPEN REDUCTION INTERNAL FIXATION (ORIF) HUMERAL SHAFT FRACTURE;  Surgeon: Hiram Gash, MD;  Location: [place];  Service: Orthopedics;  Laterality: Left; . ORTHOPEDIC SURGERY   HPI: 86 year old female admitted from SNF with displaced humerus fracture  s/p repair 1/15. OT notes holding food in mouth during po intake. SLP consulted.  Subjective: Pt was confused Assessment / Plan / Recommendation CHL IP CLINICAL IMPRESSIONS 12/30/2019 Clinical Impression Pt presents with moderately severe oropharyngeal dysphagia with resultant silent aspiration of thin liquid and nectar-thick liquid, deep laryngeal penetration with possible aspiration with honey-thick liquid, and stagnant trace laryngeal penetration of puree.  Laryngeal penetration and aspiration were related to generalized oropharyngeal weakness, premature spillage to the laryngeal vestibule, and delayed laryngeal closure.  Oral phase was remarkable for weak lingual manipulation, prolonged AP transport, reduced lingual control resulting in premature spillage to the pharynx, and reduced lingual strength resulting in oral residue.  Pharyngeal phase was remarkable for reduced BOT retraction and reduced hyolaryngeal elevation/excursion resulting in mod-severe vallecular residue.  Pt presented with severe vallecular residue with puree trials that she was unable to effectively clear despite multiple cued dry swallows.  Pt is at an elevated risk for aspiration with all PO intake at this time.   Showed pt's son this MBS playback and spoke with him in depth regarding MBS results.  Discussed NPO with consideration of alternative means of nutrition vs Dysphagia 1 (puree) solids and pudding thick liquids with known risk of aspiration and he opted for PO intake.  Therefore, recommend Dysphagia 1 (puree) solids and pudding thick liquids with known aspiration risk and strict adherence to the following compensatory strategies: 1) Small bites/sips 2) Slow rate of intake 3) Sit upright as possible 4) Remain upright for 30+ minutes after intake 5) Multiple dry swallows after each bite 6) Clear throat intermittently.  Pt may have a few ice chips before or 30+ minutes after meals following through oral care with full supervision.  SLP  Visit Diagnosis Dysphagia, oropharyngeal phase (R13.12) Attention and concentration deficit following -- Frontal lobe and executive function deficit following -- Impact on safety and function Severe aspiration risk;Risk for inadequate nutrition/hydration   CHL IP TREATMENT RECOMMENDATION 12/30/2019 Treatment Recommendations Therapy as outlined in treatment plan below   Prognosis 12/30/2019 Prognosis for Safe Diet Advancement Fair Barriers to Reach Goals Cognitive deficits;Severity of deficits Barriers/Prognosis Comment -- CHL IP DIET RECOMMENDATION 12/30/2019 SLP Diet Recommendations Dysphagia 1 (Puree) solids;Pudding thick liquid Liquid Administration via Spoon Medication Administration Crushed with puree Compensations Slow rate;Small sips/bites;Minimize environmental distractions;Effortful swallow;Multiple dry swallows after each bite/sip;Clear throat intermittently Postural Changes Seated upright at 90 degrees;Remain semi-upright after after feeds/meals (Comment)   CHL IP OTHER RECOMMENDATIONS 12/30/2019 Recommended Consults Consider GI evaluation Oral Care Recommendations Staff/trained caregiver to provide oral care;Oral care before and after PO;Oral care BID Other Recommendations Prohibited food (jello, ice cream, thin  soups);Have oral suction available;Remove water pitcher   CHL IP FOLLOW UP RECOMMENDATIONS 12/30/2019 Follow up Recommendations Skilled Nursing facility   [facility] IP FREQUENCY AND DURATION 12/30/2019 Speech Therapy Frequency (ACUTE ONLY) min 2x/week Treatment Duration 2 weeks      CHL IP ORAL PHASE 12/30/2019 Oral Phase Impaired Oral - Pudding Teaspoon -- Oral - Pudding Cup -- Oral - Honey Teaspoon -- Oral - Honey Cup Premature spillage;Delayed oral transit;Weak lingual manipulation;Lingual/palatal residue Oral - Nectar Teaspoon -- Oral - Nectar Cup -- Oral - Nectar Straw Premature spillage;Delayed oral transit;Lingual/palatal residue Oral - Thin Teaspoon Premature spillage;Delayed oral  transit;Lingual/palatal residue;Weak lingual manipulation Oral - Thin Cup -- Oral - Thin Straw Weak lingual manipulation;Premature spillage;Delayed oral transit;Lingual/palatal residue Oral - Puree Premature spillage;Delayed oral transit;Lingual/palatal residue;Weak lingual manipulation Oral - Mech Soft -- Oral - Regular -- Oral - Multi-Consistency -- Oral - Pill -- Oral Phase - Comment --  CHL IP PHARYNGEAL PHASE 12/30/2019 Pharyngeal Phase Impaired Pharyngeal- Pudding Teaspoon -- Pharyngeal -- Pharyngeal- Pudding Cup -- Pharyngeal -- Pharyngeal- Honey Teaspoon -- Pharyngeal -- Pharyngeal- Honey Cup Reduced anterior laryngeal mobility;Reduced laryngeal elevation;Reduced airway/laryngeal closure;Reduced tongue base retraction;Delayed swallow initiation-pyriform sinuses;Penetration/Aspiration before swallow;Pharyngeal residue - valleculae Pharyngeal Material enters airway, CONTACTS cords and not ejected out Pharyngeal- Nectar Teaspoon -- Pharyngeal -- Pharyngeal- Nectar Cup -- Pharyngeal -- Pharyngeal- Nectar Straw Delayed swallow initiation-pyriform sinuses;Reduced anterior laryngeal mobility;Reduced laryngeal elevation;Reduced airway/laryngeal closure;Reduced tongue base retraction;Penetration/Aspiration before swallow;Pharyngeal residue - valleculae Pharyngeal Material enters airway, passes BELOW cords without attempt by patient to eject out (silent aspiration) Pharyngeal- Thin Teaspoon Delayed swallow initiation-pyriform sinuses;Penetration/Aspiration before swallow;Reduced tongue base retraction;Reduced airway/laryngeal closure;Reduced laryngeal elevation;Reduced anterior laryngeal mobility;Pharyngeal residue - valleculae;Trace aspiration Pharyngeal Material enters airway, passes BELOW cords without attempt by patient to eject out (silent aspiration) Pharyngeal- Thin Cup -- Pharyngeal -- Pharyngeal- Thin Straw Delayed swallow initiation-pyriform sinuses;Reduced anterior laryngeal mobility;Reduced laryngeal  elevation;Reduced airway/laryngeal closure;Reduced tongue base retraction;Penetration/Aspiration before swallow;Moderate aspiration;Pharyngeal residue - valleculae Pharyngeal Material enters airway, passes BELOW cords without attempt by patient to eject out (silent aspiration) Pharyngeal- Puree Delayed swallow initiation-pyriform sinuses;Reduced anterior laryngeal mobility;Reduced laryngeal elevation;Reduced airway/laryngeal closure;Reduced tongue base retraction;Penetration/Aspiration during swallow;Pharyngeal residue - valleculae Pharyngeal Material enters airway, remains ABOVE vocal cords and not ejected out Pharyngeal- Mechanical Soft -- Pharyngeal -- Pharyngeal- Regular -- Pharyngeal -- Pharyngeal- Multi-consistency -- Pharyngeal -- Pharyngeal- Pill -- Pharyngeal -- Pharyngeal Comment --  CHL IP CERVICAL ESOPHAGEAL PHASE 12/30/2019 Cervical Esophageal Phase Impaired Pudding Teaspoon -- Pudding Cup -- Honey Teaspoon -- Honey Cup -- Nectar Teaspoon -- Nectar Cup -- Nectar Straw -- Thin Teaspoon -- Thin Cup -- Thin Straw -- Puree -- Mechanical Soft -- Regular -- Multi-consistency -- Pill -- Cervical Esophageal Comment -- Colin Mulders M.S., CCC-SLP Acute Rehabilitation Services Office: [phone] Elvia Collum [facility] 12/30/2019, 4:45 PM              DG HIP UNILAT WITH PELVIS 2-3 VIEWS RIGHT  Result Date: 12/30/2019 CLINICAL DATA:  Right hip pain for 1 day. No known injury. EXAM: DG HIP (WITH OR WITHOUT PELVIS) 2-3V RIGHT COMPARISON:  None. FINDINGS: There is no evidence of an acute fracture or dislocation. There is severe diffuse osteopenia. Old healed fracture of the proximal right femur with intramedullary rod and lag screw in place. Old deformity of the right iliac bone. Degenerative changes in the lumbar spine. IMPRESSION: No acute abnormality. Severe osteopenia. Electronically Signed   By: Lorriane Shire M.D.   On: 12/30/2019 13:42   Scheduled Meds: . calcium-vitamin  D  1 tablet Oral BID  . docusate sodium   100 mg Oral BID  . enoxaparin (LOVENOX) injection  30 mg Subcutaneous Q24H  . levothyroxine  12.5 mcg Oral Daily  . metoprolol tartrate  12.5 mg Oral BID  . mirtazapine  7.5 mg Oral QHS  . pantoprazole  40 mg Oral Daily  . sodium bicarbonate  650 mg Oral BID  . sodium zirconium cyclosilicate  5 g Oral Daily   Continuous Infusions: . lactated ringers 75 mL/hr at 12/31/19 0209    LOS: 3 days   Time spent: 35 minutes  Tiernan Millikin Hollice Gong, MD  [facility] Pager #: [phone] 7PM-7AM contact night coverage as above

## 2019-12-31 NOTE — Progress Notes (Signed)
Physical Therapy Treatment Patient Details Name: Shannon Joyce MRN: [id number] DOB: Dec 30, 1924 Today's Date: 12/31/2019    History of Present Illness Shannon Joyce is a 86 y.o. female with hypertension. She has been residing at [facility] following a right femur fracture in January of 2019 and has been non-ambulatory since this injury--but has been able to transfer herself in and out of W/C and get around in W/C by herself. She requires a wheelchair. Pt has unwitnessed fall with resultant displaced humerus fracture and now s/p ORIF humeral shaft    PT Comments    Pt admitted with above diagnosis. Pt was resistant to work with PT.  PT encouraged to sit to EOB with pt resisting movement. Once up, she could sit EOB for 10 min with min guard assist. Constantly refusing to get OOB stating, "I will get up another day but I am not getting up today."  Assisted pt back to supine due to pt refuses to get OOB and would not assist to stand. REcommend nursing to use [employer] lift.  Pt currently with functional limitations due to balance, endurance and cognitive deficits. Pt will benefit from skilled PT to increase their independence and safety with mobility to allow discharge to the venue listed below.     Follow Up Recommendations  Supervision/Assistance - 24 hour;SNF(Unless [facility] can provide max assist, will need SNF)     Equipment Recommendations  Other (comment)(defer)    Recommendations for Other Services       Precautions / Restrictions Precautions Precautions: Fall Precaution Comments: sling for comfort Required Braces or Orthoses: Sling Restrictions Weight Bearing Restrictions: Yes Other Position/Activity Restrictions: WBAT and use of LUE as tolerated    Mobility  Bed Mobility Overal bed mobility: Needs Assistance Bed Mobility: Supine to Sit;Sit to Supine     Supine to sit: Total assist;HOB elevated;+2 for physical assistance Sit to supine: Total assist;HOB elevated;+2 for physical  assistance   General bed mobility comments: Total A to get into/out of bed; no initiation or assist and with resistance to come to EOB. Needed total assist to scoot up in the bed.   Transfers Overall transfer level: Needs assistance               General transfer comment: Pt refused to do more than sit up on EOB.   Ambulation/Gait                 Stairs             Wheelchair Mobility    Modified Rankin (Stroke Patients Only)       Balance Overall balance assessment: Needs assistance Sitting-balance support: Feet supported;Bilateral upper extremity supported Sitting balance-Leahy Scale: Fair Sitting balance - Comments: Pt was able to sit EOB without UE support and without external support for up to 10 minutes with min guard assist.                                     Cognition Arousal/Alertness: Awake/alert Behavior During Therapy: Restless Overall Cognitive Status: Impaired/Different from baseline Area of Impairment: Safety/judgement;Problem solving                       Following Commands: Follows one step commands inconsistently Safety/Judgement: Decreased awareness of safety;Decreased awareness of deficits   Problem Solving: Slow processing;Decreased initiation;Difficulty sequencing;Requires verbal cues;Requires tactile cues General Comments: Pt slightly restless throughout the session. Pt  kept repeating, "I will get up another day."      Exercises      General Comments General comments (skin integrity, edema, etc.): Left UE very edematous      Pertinent Vitals/Pain Pain Assessment: Faces Faces Pain Scale: Hurts even more Pain Location: generalized during tranfers  Pain Descriptors / Indicators: Grimacing;Moaning Pain Intervention(s): Limited activity within patient's tolerance;Monitored during session;Repositioned    Home Living                      Prior Function            PT Goals (current goals  can now be found in the care plan section) Acute Rehab PT Goals Patient Stated Goal: son--for pt to go back to [facility] Progress towards PT goals: Not progressing toward goals - comment(self limiting, refuses mobility)    Frequency    Min 3X/week      PT Plan Discharge plan needs to be updated    Co-evaluation              AM-PAC PT "6 Clicks" Mobility   Outcome Measure  Help needed turning from your back to your side while in a flat bed without using bedrails?: Total Help needed moving from lying on your back to sitting on the side of a flat bed without using bedrails?: Total Help needed moving to and from a bed to a chair (including a wheelchair)?: Total Help needed standing up from a chair using your arms (e.g., wheelchair or bedside chair)?: Total Help needed to walk in hospital room?: Total Help needed climbing 3-5 steps with a railing? : Total 6 Click Score: 6    End of Session   Activity Tolerance: Patient limited by fatigue Patient left: with call bell/phone within reach;with bed alarm set;in bed Nurse Communication: Mobility status;Need for lift equipment PT Visit Diagnosis: Muscle weakness (generalized) (M62.81)     Time: 1037-1050 PT Time Calculation (min) (ACUTE ONLY): 13 min  Charges:  $Therapeutic Activity: 8-22 mins                     Aniyha Tate W,PT Acute Rehabilitation Services Pager:  [phone]  Office:  [place] 12/31/2019, 11:11 AM

## 2019-12-31 NOTE — Progress Notes (Signed)
  Speech Language Pathology Treatment: Dysphagia  Patient Details Name: Shannon Joyce MRN: [id number] DOB: May 25, 1925 Today's Date: 12/31/2019 Time: [id number] SLP Time Calculation (min) (ACUTE ONLY): 21 min  Assessment / Plan / Recommendation Clinical Impression  Patient seen for swallow therapy following MBS (1/18), revealing moderately severe oropharyngeal dysphagia. Upon arrival, SLP resituating pt in upright 90 degree position. Pt alert and corporative requiring moderate verbal and tactile cues t/o session. SLP providing oral care, transitioning to offering ice chips x3. No overt s/sx of aspiration following trials. Advancing to offering puree/ pudding-thick liquids (applesauce, magic cup), x5 small bites, requiring moderate verbal cueing to clear throat; however, pt with independent effortful swallows following bites. As pt required repositioning in bed, throat clearing/coughing noted, concerning for aspiration. SLP resituating pt in 90 degree upright position, coughing resolving. Discontinued trails d/t pt request. SLP to continue to follow; continue with current care plan. Family and son accepting aspiration risk, per documentation (1/18); possibly consider palliative care consult given severity of dysphagia and significant diet modifications needed.     HPI HPI: 86 year old female admitted from SNF with displaced humerus fracture s/p repair 1/15. OT notes holding food in mouth during po intake. SLP consulted.       SLP Plan  Continue with current plan of care       Recommendations  Diet recommendations: Pudding-thick liquid;Dysphagia 1 (puree) Liquids provided via: Teaspoon Medication Administration: Crushed with puree Supervision: Staff to assist with self feeding;Full supervision/cueing for compensatory strategies Compensations: Small sips/bites;Multiple dry swallows after each bite/sip;Clear throat intermittently;Slow rate;Effortful swallow Postural Changes and/or Swallow Maneuvers:  Seated upright 90 degrees                Oral Care Recommendations: Oral care BID Follow up Recommendations: Skilled Nursing facility SLP Visit Diagnosis: Dysphagia, oropharyngeal phase (R13.12) Plan: Continue with current plan of care       [place], Student SLP Office: [phone]  12/31/2019, 2:59 PM

## 2020-01-01 ENCOUNTER — Encounter (HOSPITAL_COMMUNITY): Payer: Self-pay | Admitting: Internal Medicine

## 2020-01-01 DIAGNOSIS — S42302A Unspecified fracture of shaft of humerus, left arm, initial encounter for closed fracture: Secondary | ICD-10-CM

## 2020-01-01 LAB — SARS CORONAVIRUS 2 (TAT 6-24 HRS): SARS Coronavirus 2: NEGATIVE

## 2020-01-01 MED ORDER — ENOXAPARIN SODIUM 40 MG/0.4ML ~~LOC~~ SOLN: 40.0000 mg | SUBCUTANEOUS | 0 refills | Status: DC

## 2020-01-01 MED ORDER — ENOXAPARIN SODIUM 40 MG/0.4ML ~~LOC~~ SOLN: 30.0000 mg | SUBCUTANEOUS | 0 refills | Status: DC

## 2020-01-01 MED ORDER — ENOXAPARIN SODIUM 30 MG/0.3ML ~~LOC~~ SOLN
30.0000 mg | SUBCUTANEOUS | Status: DC
  Administered 2020-01-01 – 2020-01-02 (×2): 30 mg via SUBCUTANEOUS
  Filled 2020-01-01 (×3): qty 0.3

## 2020-01-01 MED ORDER — OXYCODONE HCL 5 MG PO TABS: ORAL_TABLET | ORAL | 0 refills | Status: DC

## 2020-01-01 NOTE — Progress Notes (Signed)
PROGRESS NOTE    Yi Hinchman  [id number] DOB: 1924/12/28 DOA: 12/27/2019 PCP: Patient, No Pcp Per   Brief Narrative:  86 year old female with a history of dementia, current falls, CKD, hypertension, osteoarthritis, presented to the hospital with fall and noted to have oblique fracture of the mid shaft of the left humerus.  Patient is undergone internal fixation.  Medical team was consulted to assist with medical management.   Assessment & Plan   Acute kidney injury on chronic kidney disease, stage IV -Creatinine peaked to 2.75, down to 1.83 today -Patient was placed on IV fluids -Continue sodium bicarb -Continue to monitor BMP  Chronic urinary tract infection -With no complaints of dysuria -UA showed many bacteria, >50 WBC, large leukocytes. 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  Recurrent falls with recent left humeral fracture -Status post internal fixation -Disposition per primary team  Nutrition/failure to thrive -Dietary consulted -Continue IV hydration -May consider palliative care follow-up on discharge  Hypothyroidism -Continue Synthroid  DVT Prophylaxis Lovenox  Code Status: DNR  Family Communication: None at bedside  Disposition Plan: Admitted.  Disposition per primary team.  Consultants [facility] Neurology  Procedures  Left humeral shaft open reduction internal fixation  Antibiotics   Anti-infectives (From admission, onward)   Start     Dose/Rate Route Frequency Ordered Stop   12/28/19 0400  vancomycin (VANCOCIN) IVPB 1000  mg/200 mL premix     1,000 mg 200 mL/hr over 60 Minutes Intravenous Every 12 hours 12/27/19 1810 12/28/19 0527   12/27/19 2200  nitrofurantoin (MACRODANTIN) capsule 50 mg  Status:  Discontinued    Note to Pharmacy: Continuous course     50 mg Oral Daily at bedtime 12/27/19 0911 12/29/19 1433   12/27/19 1606  vancomycin (VANCOCIN) powder  Status:  Discontinued       As needed 12/27/19 1607 12/27/19 1636   12/27/19 1345  vancomycin (VANCOCIN) IVPB 1000 mg/200 mL premix     1,000 mg 200 mL/hr over 60 Minutes Intravenous On call to O.R. 12/27/19 1337 12/27/19 1555   12/27/19 1340  vancomycin (VANCOCIN) 1-5 GM/200ML-% IVPB    Note to Pharmacy: Nyoka Cowden   : cabinet override      12/27/19 1340 12/27/19 1502      Subjective:   Cedric Fishman seen and examined today.  Patient states she has "little complaints here and there".  States she is unable to reach for anything.  Continues to have pain in the left leg especially with movement.  Complains of spasms.  Denies chest pain or shortness of breath, abdominal pain, dizziness or headache.  Objective:   Vitals:   12/31/19 1317 12/31/19 2128 01/01/20 0409 01/01/20 0814  BP: 122/62 121/66 123/84 118/63  Pulse: 98 (!) 104 100 96  Resp: 18 15 16 16   Temp: 99.6 F (37.6 C) 98.7 F (37.1 C) (!) 97 F (36.1 C) 98.8 F (37.1 C)  TempSrc: Oral Oral  Oral  SpO2: 96% 92% 97% 94%  Weight:      Height:        Intake/Output Summary (Last 24 hours) at 01/01/2020 1059  Last data filed at 01/01/2020 [id number] Gross per 24 hour  Intake 300 ml  Output 300 ml  Net 0 ml   Filed Weights   12/27/19 0506  Weight: 52.2 kg    Exam  General: Well developed, elderly, thin   HEENT: NCAT, mucous membranes moist.   Cardiovascular: S1 S2 auscultated, RRR  Respiratory: Clear to auscultation bilaterally  Abdomen: Soft, nontender, nondistended, + bowel sounds  Extremities: warm dry without cyanosis clubbing or edema.  Dressing in place on left  thigh  Neuro: AAOx3,nonfocal  Psych: Appropriate mood and affect   Data Reviewed: I have personally reviewed following labs and imaging studies  CBC: Recent Labs  Lab 12/27/19 1124 12/28/19 0339 12/29/19 0747 12/30/19 0948 12/31/19 0720  WBC 16.1* 13.9* 16.5* 12.6* 11.2*  NEUTROABS  --   --   --   --  8.2*  HGB 9.4* 7.6* 7.9* 7.7* 8.2*  HCT 30.7* 24.6* 26.1* 25.1* 27.7*  MCV 112.9* 112.3* 114.0* 114.1* 114.0*  PLT 193 160 163 182 [id number]   Basic Metabolic Panel: Recent Labs  Lab 12/27/19 1124 12/28/19 0339 12/29/19 0747 12/30/19 0415 12/31/19 0720  NA 144 138 140 141 144  K 5.1 5.7* 5.3* 5.1 4.4  CL 116* 112* 113* 113* 114*  CO2 16* 16* 17* 18* 19*  GLUCOSE 143* 148* 108* 98 89  BUN 45* 40* 52* 51* 41*  CREATININE 2.75* 2.15* 2.58* 2.29* 1.83*  CALCIUM 9.1 8.3* 8.4* 8.4* 8.3*  MG  --   --   --   --  2.0  PHOS  --   --   --  3.9 2.8   GFR: Estimated Creatinine Clearance: 16.2 mL/min (A) (by C-G formula based on SCr of 1.83 mg/dL (H)). Liver Function Tests: Recent Labs  Lab 12/27/19 1124 12/31/19 0720  AST 23  --   ALT 13  --   ALKPHOS 551*  --   BILITOT 0.5  --   PROT 6.4*  --   ALBUMIN 2.8* 2.3*   No results for input(s): LIPASE, AMYLASE in the last 168 hours. No results for input(s): AMMONIA in the last 168 hours. Coagulation Profile: Recent Labs  Lab 12/27/19 1124  INR 1.1   Cardiac Enzymes: No results for input(s): CKTOTAL, CKMB, CKMBINDEX, TROPONINI in the last 168 hours. BNP (last 3 results) No results for input(s): PROBNP in the last 8760 hours. HbA1C: No results for input(s): HGBA1C in the last 72 hours. CBG: No results for input(s): GLUCAP in the last 168 hours. Lipid Profile: No results for input(s): CHOL, HDL, LDLCALC, TRIG, CHOLHDL, LDLDIRECT in the last 72 hours. Thyroid Function Tests: No results for input(s): TSH, T4TOTAL, FREET4, T3FREE, THYROIDAB in the last 72 hours. Anemia Panel: No results for input(s): VITAMINB12, FOLATE,  FERRITIN, TIBC, IRON, RETICCTPCT in the last 72 hours. Urine analysis:    Component Value Date/Time   COLORURINE YELLOW 12/30/2019 1419   APPEARANCEUR CLOUDY (A) 12/30/2019 1419   LABSPEC 1.014 12/30/2019 1419   PHURINE 6.0 12/30/2019 1419   GLUCOSEU NEGATIVE 12/30/2019 1419   HGBUR MODERATE (A) 12/30/2019 1419   BILIRUBINUR NEGATIVE 12/30/2019 1419   KETONESUR 5 (A) 12/30/2019 1419   PROTEINUR 30 (A) 12/30/2019 1419   NITRITE NEGATIVE 12/30/2019 1419   LEUKOCYTESUR LARGE (A) 12/30/2019 1419   Sepsis Labs: @LABRCNTIP (procalcitonin:4,lacticidven:4)  ) Recent Results (from the past 240 hour(s))  Respiratory Panel by RT PCR (Flu A&B, Covid) - Nasopharyngeal Swab     Status: None   Collection Time: 12/27/19  6:38 AM   Specimen: Nasopharyngeal Swab  Result Value Ref Range Status   SARS Coronavirus 2 by RT PCR NEGATIVE NEGATIVE Final    Comment: (NOTE) SARS-CoV-2 target nucleic acids are NOT DETECTED. The SARS-CoV-2 RNA is generally detectable in upper respiratoy specimens during the acute phase of infection. The lowest concentration of SARS-CoV-2 viral copies this assay can detect is 131 copies/mL. A negative result does not preclude SARS-Cov-2 infection and should not be used as the sole basis for treatment or other patient management decisions. A negative result may occur with  improper specimen collection/handling, submission of specimen other than nasopharyngeal swab, presence of viral mutation(s) within the areas targeted by this assay, and inadequate number of viral copies (<131 copies/mL). A negative result must be combined with clinical observations, patient history, and epidemiological information. The expected result is Negative. Fact Sheet for Patients:  PinkCheek.be Fact Sheet for Healthcare Providers:  GravelBags.it This test is not yet ap proved or cleared by the [place] FDA and  has been authorized  for detection and/or diagnosis of SARS-CoV-2 by FDA under an Emergency Use Authorization (EUA). This EUA will remain  in effect (meaning this test can be used) for the duration of the COVID-19 declaration under Section 564(b)(1) of the Act, 21 U.S.C. section 360bbb-3(b)(1), unless the authorization is terminated or revoked sooner.    Influenza A by PCR NEGATIVE NEGATIVE Final   Influenza B by PCR NEGATIVE NEGATIVE Final    Comment: (NOTE) The Xpert Xpress SARS-CoV-2/FLU/RSV assay is intended as an aid in  the diagnosis of influenza from Nasopharyngeal swab specimens and  should not be used as a sole basis for treatment. Nasal washings and  aspirates are unacceptable for Xpert Xpress SARS-CoV-2/FLU/RSV  testing. Fact Sheet for Patients: PinkCheek.be Fact Sheet for Healthcare Providers: GravelBags.it This test is not yet approved or cleared by the [place] FDA and  has been authorized for detection and/or diagnosis of SARS-CoV-2 by  FDA under an Emergency Use Authorization (EUA). This EUA will remain  in effect (meaning this test can be used) for the duration of the  Covid-19 declaration under Section 564(b)(1) of the Act, 21  U.S.C. section 360bbb-3(b)(1), unless the authorization is  terminated or revoked. Performed at [facility], [place] [address] [place], [place] [address]   Surgical pcr screen     Status: None   Collection Time: 12/27/19  9:11 AM   Specimen: Nasal Mucosa; Nasal Swab  Result Value Ref Range Status   MRSA, PCR NEGATIVE NEGATIVE Final   Staphylococcus aureus NEGATIVE NEGATIVE Final    Comment: (NOTE) The Xpert SA Assay (FDA approved for NASAL specimens in patients 54 years of age and older), is one component of a comprehensive surveillance program. It is not intended to diagnose infection nor to guide or monitor treatment. Performed at [facility], [place] [address], [place],  [place] [address]   SARS CORONAVIRUS 2 (TAT 6-24 HRS) Nasopharyngeal Nasopharyngeal Swab     Status: None   Collection Time: 01/01/20  5:20 AM   Specimen: Nasopharyngeal Swab  Result Value Ref Range Status   SARS Coronavirus 2 NEGATIVE NEGATIVE Final    Comment: (NOTE) SARS-CoV-2 target nucleic acids are NOT DETECTED. The SARS-CoV-2 RNA is generally detectable in upper and lower respiratory specimens during the acute phase of infection. Negative results do not preclude SARS-CoV-2 infection, do not rule out co-infections with other pathogens, and should not be used as the sole basis for treatment or  other patient management decisions. Negative results must be combined with clinical observations, patient history, and epidemiological information. The expected result is Negative. Fact Sheet for Patients: SugarRoll.be Fact Sheet for Healthcare Providers: https://www.woods-mathews.com/ This test is not yet approved or cleared by the [place] FDA and  has been authorized for detection and/or diagnosis of SARS-CoV-2 by FDA under an Emergency Use Authorization (EUA). This EUA will remain  in effect (meaning this test can be used) for the duration of the COVID-19 declaration under Section 56 4(b)(1) of the Act, 21 U.S.C. section 360bbb-3(b)(1), unless the authorization is terminated or revoked sooner. Performed at [facility], [place] [address], [place], [place] [address]       Radiology Studies: DG Knee Right Port  Result Date: 12/30/2019 CLINICAL DATA:  Right knee pain.  No known injury. EXAM: PORTABLE RIGHT KNEE - 1-2 VIEW COMPARISON:  None. FINDINGS: There is no acute bony or joint abnormality. Bones are markedly osteopenic. There is partial visualization of an intramedullary nail in the distal femur and plate and screws in the proximal tibia for fracture fixation. Remote healed fracture of the proximal diaphysis of the fibula is noted.  IMPRESSION: No acute abnormality. Hardware for fracture fixation in the femur and tibia. Healed fracture of the proximal fibula noted. Severe osteopenia. Electronically Signed   By: Inge Rise M.D.   On: 12/30/2019 14:27   DG Swallowing Func-Speech Pathology  Result Date: 12/30/2019 Objective Swallowing Evaluation: Type of Study: MBS-Modified Barium Swallow Study  Patient Details Name: Glennis Clester MRN: [id number] Date of Birth: August 16, 1925 Today's Date: 12/30/2019 Time: SLP Start Time (ACUTE ONLY): 1330 -SLP Stop Time (ACUTE ONLY): 1349 SLP Time Calculation (min) (ACUTE ONLY): 19 min Past Medical History: Past Medical History: Diagnosis Date . Chronic kidney disease  . Hypertension  . UTI (lower urinary tract infection)  Past Surgical History: Past Surgical History: Procedure Laterality Date . ABDOMINAL HYSTERECTOMY   . CHOLECYSTECTOMY   . ORIF HUMERUS FRACTURE Left 12/27/2019  Procedure: OPEN REDUCTION INTERNAL FIXATION (ORIF) HUMERAL SHAFT FRACTURE;  Surgeon: Hiram Gash, MD;  Location: [facility];  Service: Orthopedics;  Laterality: Left; . ORTHOPEDIC SURGERY   HPI: 86 year old female admitted from SNF with displaced humerus fracture s/p repair 1/15. OT notes holding food in mouth during po intake. SLP consulted.  Subjective: Pt was confused Assessment / Plan / Recommendation CHL IP CLINICAL IMPRESSIONS 12/30/2019 Clinical Impression Pt presents with moderately severe oropharyngeal dysphagia with resultant silent aspiration of thin liquid and nectar-thick liquid, deep laryngeal penetration with possible aspiration with honey-thick liquid, and stagnant trace laryngeal penetration of puree.  Laryngeal penetration and aspiration were related to generalized oropharyngeal weakness, premature spillage to the laryngeal vestibule, and delayed laryngeal closure.  Oral phase was remarkable for weak lingual manipulation, prolonged AP transport, reduced lingual control resulting in premature spillage to the pharynx, and  reduced lingual strength resulting in oral residue.  Pharyngeal phase was remarkable for reduced BOT retraction and reduced hyolaryngeal elevation/excursion resulting in mod-severe vallecular residue.  Pt presented with severe vallecular residue with puree trials that she was unable to effectively clear despite multiple cued dry swallows.  Pt is at an elevated risk for aspiration with all PO intake at this time.   Showed pt's son this MBS playback and spoke with him in depth regarding MBS results.  Discussed NPO with consideration of alternative means of nutrition vs Dysphagia 1 (puree) solids and pudding thick liquids with known risk of aspiration and he opted  for PO intake.  Therefore, recommend Dysphagia 1 (puree) solids and pudding thick liquids with known aspiration risk and strict adherence to the following compensatory strategies: 1) Small bites/sips 2) Slow rate of intake 3) Sit upright as possible 4) Remain upright for 30+ minutes after intake 5) Multiple dry swallows after each bite 6) Clear throat intermittently.  Pt may have a few ice chips before or 30+ minutes after meals following through oral care with full supervision.  SLP Visit Diagnosis Dysphagia, oropharyngeal phase (R13.12) Attention and concentration deficit following -- Frontal lobe and executive function deficit following -- Impact on safety and function Severe aspiration risk;Risk for inadequate nutrition/hydration   CHL IP TREATMENT RECOMMENDATION 12/30/2019 Treatment Recommendations Therapy as outlined in treatment plan below   Prognosis 12/30/2019 Prognosis for Safe Diet Advancement Fair Barriers to Reach Goals Cognitive deficits;Severity of deficits Barriers/Prognosis Comment -- CHL IP DIET RECOMMENDATION 12/30/2019 SLP Diet Recommendations Dysphagia 1 (Puree) solids;Pudding thick liquid Liquid Administration via Spoon Medication Administration Crushed with puree Compensations Slow rate;Small sips/bites;Minimize environmental  distractions;Effortful swallow;Multiple dry swallows after each bite/sip;Clear throat intermittently Postural Changes Seated upright at 90 degrees;Remain semi-upright after after feeds/meals (Comment)   CHL IP OTHER RECOMMENDATIONS 12/30/2019 Recommended Consults Consider GI evaluation Oral Care Recommendations Staff/trained caregiver to provide oral care;Oral care before and after PO;Oral care BID Other Recommendations Prohibited food (jello, ice cream, thin soups);Have oral suction available;Remove water pitcher   CHL IP FOLLOW UP RECOMMENDATIONS 12/30/2019 Follow up Recommendations Skilled Nursing facility   [facility] IP FREQUENCY AND DURATION 12/30/2019 Speech Therapy Frequency (ACUTE ONLY) min 2x/week Treatment Duration 2 weeks      CHL IP ORAL PHASE 12/30/2019 Oral Phase Impaired Oral - Pudding Teaspoon -- Oral - Pudding Cup -- Oral - Honey Teaspoon -- Oral - Honey Cup Premature spillage;Delayed oral transit;Weak lingual manipulation;Lingual/palatal residue Oral - Nectar Teaspoon -- Oral - Nectar Cup -- Oral - Nectar Straw Premature spillage;Delayed oral transit;Lingual/palatal residue Oral - Thin Teaspoon Premature spillage;Delayed oral transit;Lingual/palatal residue;Weak lingual manipulation Oral - Thin Cup -- Oral - Thin Straw Weak lingual manipulation;Premature spillage;Delayed oral transit;Lingual/palatal residue Oral - Puree Premature spillage;Delayed oral transit;Lingual/palatal residue;Weak lingual manipulation Oral - Mech Soft -- Oral - Regular -- Oral - Multi-Consistency -- Oral - Pill -- Oral Phase - Comment --  CHL IP PHARYNGEAL PHASE 12/30/2019 Pharyngeal Phase Impaired Pharyngeal- Pudding Teaspoon -- Pharyngeal -- Pharyngeal- Pudding Cup -- Pharyngeal -- Pharyngeal- Honey Teaspoon -- Pharyngeal -- Pharyngeal- Honey Cup Reduced anterior laryngeal mobility;Reduced laryngeal elevation;Reduced airway/laryngeal closure;Reduced tongue base retraction;Delayed swallow initiation-pyriform  sinuses;Penetration/Aspiration before swallow;Pharyngeal residue - valleculae Pharyngeal Material enters airway, CONTACTS cords and not ejected out Pharyngeal- Nectar Teaspoon -- Pharyngeal -- Pharyngeal- Nectar Cup -- Pharyngeal -- Pharyngeal- Nectar Straw Delayed swallow initiation-pyriform sinuses;Reduced anterior laryngeal mobility;Reduced laryngeal elevation;Reduced airway/laryngeal closure;Reduced tongue base retraction;Penetration/Aspiration before swallow;Pharyngeal residue - valleculae Pharyngeal Material enters airway, passes BELOW cords without attempt by patient to eject out (silent aspiration) Pharyngeal- Thin Teaspoon Delayed swallow initiation-pyriform sinuses;Penetration/Aspiration before swallow;Reduced tongue base retraction;Reduced airway/laryngeal closure;Reduced laryngeal elevation;Reduced anterior laryngeal mobility;Pharyngeal residue - valleculae;Trace aspiration Pharyngeal Material enters airway, passes BELOW cords without attempt by patient to eject out (silent aspiration) Pharyngeal- Thin Cup -- Pharyngeal -- Pharyngeal- Thin Straw Delayed swallow initiation-pyriform sinuses;Reduced anterior laryngeal mobility;Reduced laryngeal elevation;Reduced airway/laryngeal closure;Reduced tongue base retraction;Penetration/Aspiration before swallow;Moderate aspiration;Pharyngeal residue - valleculae Pharyngeal Material enters airway, passes BELOW cords without attempt by patient to eject out (silent aspiration) Pharyngeal- Puree Delayed swallow initiation-pyriform sinuses;Reduced anterior laryngeal mobility;Reduced laryngeal elevation;Reduced airway/laryngeal closure;Reduced tongue base  retraction;Penetration/Aspiration during swallow;Pharyngeal residue - valleculae Pharyngeal Material enters airway, remains ABOVE vocal cords and not ejected out Pharyngeal- Mechanical Soft -- Pharyngeal -- Pharyngeal- Regular -- Pharyngeal -- Pharyngeal- Multi-consistency -- Pharyngeal -- Pharyngeal- Pill --  Pharyngeal -- Pharyngeal Comment --  CHL IP CERVICAL ESOPHAGEAL PHASE 12/30/2019 Cervical Esophageal Phase Impaired Pudding Teaspoon -- Pudding Cup -- Honey Teaspoon -- Honey Cup -- Nectar Teaspoon -- Nectar Cup -- Nectar Straw -- Thin Teaspoon -- Thin Cup -- Thin Straw -- Puree -- Mechanical Soft -- Regular -- Multi-consistency -- Pill -- Cervical Esophageal Comment -- Colin Mulders M.S., CCC-SLP Acute Rehabilitation Services Office: [phone] Elvia Collum [facility] 12/30/2019, 4:45 PM              DG HIP UNILAT WITH PELVIS 2-3 VIEWS RIGHT  Result Date: 12/30/2019 CLINICAL DATA:  Right hip pain for 1 day. No known injury. EXAM: DG HIP (WITH OR WITHOUT PELVIS) 2-3V RIGHT COMPARISON:  None. FINDINGS: There is no evidence of an acute fracture or dislocation. There is severe diffuse osteopenia. Old healed fracture of the proximal right femur with intramedullary rod and lag screw in place. Old deformity of the right iliac bone. Degenerative changes in the lumbar spine. IMPRESSION: No acute abnormality. Severe osteopenia. Electronically Signed   By: Lorriane Shire M.D.   On: 12/30/2019 13:42     Scheduled Meds: . calcium-vitamin D  1 tablet Oral BID  . docusate sodium  100 mg Oral BID  . enoxaparin (LOVENOX) injection  30 mg Subcutaneous Q24H  . levothyroxine  12.5 mcg Oral Daily  . metoprolol tartrate  12.5 mg Oral BID  . mirtazapine  7.5 mg Oral QHS  . pantoprazole  40 mg Oral Daily  . sodium bicarbonate  650 mg Oral BID   Continuous Infusions: . lactated ringers 50 mL/hr at 12/31/19 2350     LOS: 4 days   Time Spent in minutes   30 minutes  Rochelle Nephew D.O. on 01/01/2020 at 10:59 AM  Between 7am to 7pm - Please see pager noted on [email or web address]  After 7pm go to [email or web address]  And look for the night coverage person covering for me after hours  [facility] Office  [phone]

## 2020-01-01 NOTE — Progress Notes (Signed)
Subjective: Patient is status post ORIF left humerus fracture on 1/15 by Dr. Griffin Basil. She is resting in her hospital bed in no acute distress. She says her left arm hurts "some."  Objective:   VITALS:   Vitals:   12/31/19 1317 12/31/19 2128 01/01/20 0409 01/01/20 0814  BP: 122/62 121/66 123/84 118/63  Pulse: 98 (!) 104 100 96  Resp: 18 15 16 16   Temp: 99.6 F (37.6 C) 98.7 F (37.1 C) (!) 97 F (36.1 C) 98.8 F (37.1 C)  TempSrc: Oral Oral  Oral  SpO2: 96% 92% 97% 94%  Weight:      Height:       CBC Latest Ref Rng & Units 12/31/2019 12/30/2019 12/29/2019  WBC 4.0 - 10.5 K/uL 11.2(H) 12.6(H) 16.5(H)  Hemoglobin 12.0 - 15.0 g/dL 8.2(L) 7.7(L) 7.9(L)  Hematocrit 36.0 - 46.0 % 27.7(L) 25.1(L) 26.1(L)  Platelets 150 - 400 K/uL 183 182 163   BMP Latest Ref Rng & Units 12/31/2019 12/30/2019 12/29/2019  Glucose 70 - 99 mg/dL 89 98 108(H)  BUN 8 - 23 mg/dL 41(H) 51(H) 52(H)  Creatinine 0.44 - 1.00 mg/dL 1.83(H) 2.29(H) 2.58(H)  Sodium 135 - 145 mmol/L 144 141 140  Potassium 3.5 - 5.1 mmol/L 4.4 5.1 5.3(H)  Chloride 98 - 111 mmol/L 114(H) 113(H) 113(H)  CO2 22 - 32 mmol/L 19(L) 18(L) 17(L)  Calcium 8.9 - 10.3 mg/dL 8.3(L) 8.4(L) 8.4(L)   Intake/Output      01/19 0701 - 01/20 0700 01/20 0701 - 01/21 0700   P.O. 300    Total Intake(mL/kg) 300 (5.7)    Urine (mL/kg/hr) 300 (0.2)    Total Output 300    Net 0         Urine Occurrence 1 x      Physical Exam: General: Elderly female resting comfortably in hospital bed, NAD Resp: Respiratory effort normal Cardio: No pedal edema ABD: soft Neurological: She follows some commands, keeps her eyes closed most of the time MSK: LUE: Dressing C/D/I. Some ecchymosis surrounding distal half of incision site. ROM limited due to patient's cooperation this morning. Some swelling about the left hand. She is able to wiggle her fingers. She has been able to demonstrate + Motor for ulnar distributions, rest of motor examination was limited due to  her cooperation. She endorses sensation about medial, radial, and ulnar distributions. Well perfused digits.  LLE: She can actively extend and flex knee, intact EHL/TA/GSC, compartments soft, no pain with passive stretch RLE: some TTP about hip and knee, no pain about calf, she moves her ankle and foot without pain, compartments soft, she endorses sensation distally   Assessment / Plan: 5 Days Post-Op  S/P Procedure(s) (LRB): OPEN REDUCTION INTERNAL FIXATION (ORIF) HUMERAL SHAFT FRACTURE (Left) by Dr. Griffin Basil on 12/26/2018   Active Problems:   Fracture of humeral shaft, left, closed   Displaced fracture of shaft of left humerus   AKI (acute kidney injury) ([place])   Left humeral shaft fracture, status post ORIF: - WBAT LUE - Mepilex dressing. Reinforce dressing as needed.  - Incentive spirometry  Right leg pain: X-rays of right hip and knee negative for acute bony abnormalities. Pain seems to be chronic according to chart review. No further treatment at this time.   Delirium: Neurology work-up negative for acute stroke. Appreciate consultation. Likely post-op confusion following surgery in combination with age, underlying cognitive impairment, narcotic effect, renal failure.  Dysphagia: Appreciate speech pathology evaluation and advice. Per SLP note:  "Diet Recommendations:  Dysphagia 1 (Puree) solids;Pudding thick liquid. Liquid Administration via: Spoon. Medication Administration: Crushed with puree. Supervision: Full assist for feeding;Full supervision/cueing for compensatory strategies."  Medical co-management: Appreciate Hospitalist team consultation and advice. Per Hospitalist, Dr. Hollice Gong, note: "She is medically stable and does not need further inpatient acute care for her medical issues. I would recommend to recheck her renal function within one week, as it is improving with hydration. I would also discharge her on Metoprolol BID with holding parameters as currently  ordered."  VTE prophylaxis: Lovenox, SCDs, ambulation  Pain control: Minimize narcotics.  Tylenol.  Oxycodone as needed. Dilaudid has been discontinued as patient had increased delirium after receiving medication.   Dispo:  PT recommending 24 hour assistance - SNF or back to [facility] if they can provide max assist and manage her care. Patient's son would like patient to return back to [facility]. Patient has been cleared for discharge from medicine team and orthopedics. Case management is following. Disposition pending arrangement for post-discharge care/SNF availability.    Prescriptions for Lovenox and Oxycodone printed and placed in patient's chart for discharge.    Ethelda Chick, PA-C 01/01/2020, 8:23 AM

## 2020-01-01 NOTE — NC FL2 (Deleted)
[place] MEDICAID FL2 LEVEL OF CARE SCREENING TOOL     IDENTIFICATION  Patient Name: Shannon Joyce Birthdate: [birth year] Sex: female Admission Date (Current Location): 12/27/2019  [facility] and [place] Number:  Herbalist and Address:  The [place], [place] [address], [place], [place] [address]      Provider Number: [id number]  Attending Physician Name and Address:  Hiram Gash, MD  Relative Name and Phone Number:  Simmone Figuereo [phone]    Current Level of Care: Hospital  Memory Care Unit  [facility] Prior Approval Number:    Date Approved/Denied:   PASRR Number:    Discharge Plan: Other (Comment)([facility])    Current Diagnoses: Patient Active Problem List   Diagnosis Date Noted  . AKI (acute kidney injury) ([place])   . Displaced fracture of shaft of left humerus 12/28/2019  . Fracture of humeral shaft, left, closed 12/27/2019    Orientation RESPIRATION BLADDER Height & Weight     Self  Normal Incontinent Weight: 52.2 kg Height:  5\' 4"  (162.6 cm)  BEHAVIORAL SYMPTOMS/MOOD NEUROLOGICAL BOWEL NUTRITION STATUS      Continent Diet(dyphagia 1 , pudding thick)  AMBULATORY STATUS COMMUNICATION OF NEEDS Skin   Extensive Assist Verbally                         Personal Care Assistance Level of Assistance  Bathing, Dressing, Feeding Bathing Assistance: Maximum assistance Feeding assistance: Limited assistance Dressing Assistance: Maximum assistance     Functional Limitations Info  Sight, Speech, Hearing Sight Info: Adequate Hearing Info: Adequate Speech Info: Impaired    SPECIAL CARE FACTORS FREQUENCY  OT (By licensed OT), PT (By licensed PT), Speech therapy     PT Frequency: 5x /week OT Frequency: 5x/ week     Speech Therapy Frequency: 2x /week      Contractures Contractures Info: Not present    Additional Factors Info  Code Status, Allergies Code Status Info: DNR Allergies Info: Clinoril  Sulindac, Darvon Propoxyphene, Levaquin Levofloxacin In D5w, Meropenem, Penicillins, Sulfa Antibiotics, Cefadroxil, Gentamicin           Current Medications (01/01/2020):  This is the current hospital active medication list Current Facility-Administered Medications  Medication Dose Route Frequency Provider Last Rate Last Admin  . bisacodyl (DULCOLAX) EC tablet 5 mg  5 mg Oral Daily PRN McBane, Maylene Roes, PA-C      . calcium-vitamin D (OSCAL WITH D) 500-200 MG-UNIT per tablet 1 tablet  1 tablet Oral BID Hiram Gash, MD   1 tablet at 01/01/20 1000  . diphenhydrAMINE (BENADRYL) 12.5 MG/5ML elixir 12.5-25 mg  12.5-25 mg Oral Q4H PRN McBane, Maylene Roes, PA-C      . docusate sodium (COLACE) capsule 100 mg  100 mg Oral BID Ethelda Chick, PA-C   100 mg at 01/01/20 1000  . enoxaparin (LOVENOX) injection 30 mg  30 mg Subcutaneous Q24H McBane, Caroline N, PA-C   30 mg at 01/01/20 1000  . food thickener (THICK IT) powder   Oral PRN Hiram Gash, MD      . lactated ringers infusion   Intravenous Continuous Tomma Rakers, MD 50 mL/hr at 12/31/19 2350 New Bag at 12/31/19 2350  . levothyroxine (SYNTHROID) tablet 12.5 mcg  12.5 mcg Oral Daily Ethelda Chick, PA-C   12.5 mcg at 01/01/20 [id number]  . magnesium citrate solution 1 Bottle  1 Bottle Oral Once PRN McBane, Maylene Roes, PA-C      .  metoprolol tartrate (LOPRESSOR) tablet 12.5 mg  12.5 mg Oral BID Hollice Gong, Mir Mohammed, MD   12.5 mg at 01/01/20 1000  . mirtazapine (REMERON) tablet 7.5 mg  7.5 mg Oral QHS Ethelda Chick, PA-C   7.5 mg at 12/31/19 2156  . ondansetron (ZOFRAN) tablet 4 mg  4 mg Oral Q6H PRN McBane, Maylene Roes, PA-C       Or  . ondansetron (ZOFRAN) injection 4 mg  4 mg Intravenous Q6H PRN McBane, Maylene Roes, PA-C      . oxyCODONE (Oxy IR/ROXICODONE) immediate release tablet 2.5-5 mg  2.5-5 mg Oral Q4H PRN Ethelda Chick, PA-C   5 mg at 12/31/19 1953  . pantoprazole (PROTONIX) EC tablet 40 mg  40 mg Oral Daily  Ethelda Chick, PA-C   40 mg at 01/01/20 1000  . senna-docusate (Senokot-S) tablet 1 tablet  1 tablet Oral QHS PRN McBane, Maylene Roes, PA-C      . sodium bicarbonate tablet 650 mg  650 mg Oral BID Wynetta Fines T, MD   650 mg at 01/01/20 1000     Discharge Medications: Please see discharge summary for a list of discharge medications.  Relevant Imaging Results:  Relevant Lab Results:   Additional Information ss# [id number]  Sharin Mons, RN

## 2020-01-01 NOTE — NC FL2 (Deleted)
[place] MEDICAID FL2 LEVEL OF CARE SCREENING TOOL     IDENTIFICATION  Patient Name: Shannon Joyce Birthdate: 03-24-1925 Sex: female Admission Date (Current Location): 12/27/2019  [facility] and [place] Number:  Herbalist and Address:  The [place]. [facility], [place] [address], [place], [place] [address]      Provider Number: [id number]  Attending Physician Name and Address:  Hiram Gash, MD  Relative Name and Phone Number:  Myrissa Bubier [phone]    Current Level of Care: Hospital Recommended Level of Care: Memory Care([facility]) Prior Approval Number:    Date Approved/Denied:   PASRR Number:    Discharge Plan: Other (Comment)(Memory Care Unit at [facility])    Current Diagnoses: Patient Active Problem List   Diagnosis Date Noted  . AKI (acute kidney injury) ([place])   . Displaced fracture of shaft of left humerus 12/28/2019  . Fracture of humeral shaft, left, closed 12/27/2019    Orientation RESPIRATION BLADDER Height & Weight     Self  Normal Incontinent Weight: 52.2 kg Height:  5\' 4"  (162.6 cm)  BEHAVIORAL SYMPTOMS/MOOD NEUROLOGICAL BOWEL NUTRITION STATUS      Continent Diet(dyphagia 1 , pudding thick)  AMBULATORY STATUS COMMUNICATION OF NEEDS Skin   Extensive Assist Verbally                         Personal Care Assistance Level of Assistance  Bathing, Dressing, Feeding Bathing Assistance: Maximum assistance Feeding assistance: Limited assistance Dressing Assistance: Maximum assistance     Functional Limitations Info  Sight, Speech, Hearing Sight Info: Adequate Hearing Info: Adequate Speech Info: Impaired    SPECIAL CARE FACTORS FREQUENCY  OT (By licensed OT), PT (By licensed PT), Speech therapy     PT Frequency: 5x /week OT Frequency: 5x/ week     Speech Therapy Frequency: 2x /week      Contractures Contractures Info: Not present    Additional Factors Info  Code Status, Allergies Code Status Info:  DNR Allergies Info: Clinoril Sulindac, Darvon Propoxyphene, Levaquin Levofloxacin In D5w, Meropenem, Penicillins, Sulfa Antibiotics, Cefadroxil, Gentamicin           Current Medications (01/01/2020):  This is the current hospital active medication list Current Facility-Administered Medications  Medication Dose Route Frequency Provider Last Rate Last Admin  . bisacodyl (DULCOLAX) EC tablet 5 mg  5 mg Oral Daily PRN McBane, Maylene Roes, PA-C      . calcium-vitamin D (OSCAL WITH D) 500-200 MG-UNIT per tablet 1 tablet  1 tablet Oral BID Hiram Gash, MD   1 tablet at 01/01/20 1000  . diphenhydrAMINE (BENADRYL) 12.5 MG/5ML elixir 12.5-25 mg  12.5-25 mg Oral Q4H PRN McBane, Maylene Roes, PA-C      . docusate sodium (COLACE) capsule 100 mg  100 mg Oral BID Ethelda Chick, PA-C   100 mg at 01/01/20 1000  . enoxaparin (LOVENOX) injection 30 mg  30 mg Subcutaneous Q24H McBane, Caroline N, PA-C   30 mg at 01/01/20 1000  . food thickener (THICK IT) powder   Oral PRN Hiram Gash, MD      . lactated ringers infusion   Intravenous Continuous Tomma Rakers, MD 50 mL/hr at 12/31/19 2350 New Bag at 12/31/19 2350  . levothyroxine (SYNTHROID) tablet 12.5 mcg  12.5 mcg Oral Daily Ethelda Chick, PA-C   12.5 mcg at 01/01/20 [id number]  . magnesium citrate solution 1 Bottle  1 Bottle Oral Once PRN McBane,  Maylene Roes, PA-C      . metoprolol tartrate (LOPRESSOR) tablet 12.5 mg  12.5 mg Oral BID Hollice Gong, Mir Mohammed, MD   12.5 mg at 01/01/20 1000  . mirtazapine (REMERON) tablet 7.5 mg  7.5 mg Oral QHS Ethelda Chick, PA-C   7.5 mg at 12/31/19 2156  . ondansetron (ZOFRAN) tablet 4 mg  4 mg Oral Q6H PRN McBane, Maylene Roes, PA-C       Or  . ondansetron (ZOFRAN) injection 4 mg  4 mg Intravenous Q6H PRN McBane, Maylene Roes, PA-C      . oxyCODONE (Oxy IR/ROXICODONE) immediate release tablet 2.5-5 mg  2.5-5 mg Oral Q4H PRN Ethelda Chick, PA-C   5 mg at 12/31/19 1953  . pantoprazole (PROTONIX) EC  tablet 40 mg  40 mg Oral Daily Ethelda Chick, PA-C   40 mg at 01/01/20 1000  . senna-docusate (Senokot-S) tablet 1 tablet  1 tablet Oral QHS PRN McBane, Maylene Roes, PA-C      . sodium bicarbonate tablet 650 mg  650 mg Oral BID Wynetta Fines T, MD   650 mg at 01/01/20 1000     Discharge Medications: Please see discharge summary for a list of discharge medications.  Relevant Imaging Results:  Relevant Lab Results:   Additional Information ss# [id number]  Sharin Mons, RN

## 2020-01-01 NOTE — NC FL2 (Signed)
[place] MEDICAID FL2 LEVEL OF CARE SCREENING TOOL     IDENTIFICATION  Patient Name: Shannon Joyce Birthdate: 1925/04/24 Sex: female Admission Date (Current Location): 12/27/2019  [facility] and [place] Number:  Herbalist and Address:  The [place]. [facility], [place] [address], [place], [place] [address]      Provider Number: [id number]  Attending Physician Name and Address:  Hiram Gash, MD  Relative Name and Phone Number:  Clareen Oboyle [phone]    Current Level of Care: Hospital Recommended Level of Care: Memory Care([facility]) Prior Approval Number:    Date Approved/Denied:   PASRR Number:    Discharge Plan: Other (Comment)(Memory Care Unit at [facility])    Current Diagnoses: Patient Active Problem List   Diagnosis Date Noted  . AKI (acute kidney injury) ([place])   . Displaced fracture of shaft of left humerus 12/28/2019  . Fracture of humeral shaft, left, closed 12/27/2019    Orientation RESPIRATION BLADDER Height & Weight     Self  Normal Incontinent Weight: 52.2 kg Height:  5\' 4"  (162.6 cm)  BEHAVIORAL SYMPTOMS/MOOD NEUROLOGICAL BOWEL NUTRITION STATUS      Continent Diet(dyphagia 1 , pudding thick)  AMBULATORY STATUS COMMUNICATION OF NEEDS Skin   Extensive Assist Verbally                         Personal Care Assistance Level of Assistance  Bathing, Dressing, Feeding Bathing Assistance: Maximum assistance Feeding assistance: Limited assistance Dressing Assistance: Maximum assistance     Functional Limitations Info  Sight, Speech, Hearing Sight Info: Adequate Hearing Info: Adequate Speech Info: Impaired    SPECIAL CARE FACTORS FREQUENCY  OT (By licensed OT), PT (By licensed PT), Speech therapy     PT Frequency: 5x /week OT Frequency: 5x/ week     Speech Therapy Frequency: 2x /week      Contractures Contractures Info: Not present    Additional Factors Info  Code Status, Allergies Code Status Info:  DNR Allergies Info: Clinoril Sulindac, Darvon Propoxyphene, Levaquin Levofloxacin In D5w, Meropenem, Penicillins, Sulfa Antibiotics, Cefadroxil, Gentamicin           Current Medications (01/01/2020):  This is the current hospital active medication list Current Facility-Administered Medications  Medication Dose Route Frequency Provider Last Rate Last Admin  . bisacodyl (DULCOLAX) EC tablet 5 mg  5 mg Oral Daily PRN McBane, Maylene Roes, PA-C      . calcium-vitamin D (OSCAL WITH D) 500-200 MG-UNIT per tablet 1 tablet  1 tablet Oral BID Hiram Gash, MD   1 tablet at 01/01/20 1000  . diphenhydrAMINE (BENADRYL) 12.5 MG/5ML elixir 12.5-25 mg  12.5-25 mg Oral Q4H PRN McBane, Maylene Roes, PA-C      . docusate sodium (COLACE) capsule 100 mg  100 mg Oral BID Ethelda Chick, PA-C   100 mg at 01/01/20 1000  . enoxaparin (LOVENOX) injection 30 mg  30 mg Subcutaneous Q24H McBane, Caroline N, PA-C   30 mg at 01/01/20 1000  . food thickener (THICK IT) powder   Oral PRN Hiram Gash, MD      . lactated ringers infusion   Intravenous Continuous Tomma Rakers, MD 50 mL/hr at 12/31/19 2350 New Bag at 12/31/19 2350  . levothyroxine (SYNTHROID) tablet 12.5 mcg  12.5 mcg Oral Daily Ethelda Chick, PA-C   12.5 mcg at 01/01/20 [id number]  . magnesium citrate solution 1 Bottle  1 Bottle Oral Once PRN McBane,  Maylene Roes, PA-C      . metoprolol tartrate (LOPRESSOR) tablet 12.5 mg  12.5 mg Oral BID Hollice Gong, Mir Mohammed, MD   12.5 mg at 01/01/20 1000  . mirtazapine (REMERON) tablet 7.5 mg  7.5 mg Oral QHS Ethelda Chick, PA-C   7.5 mg at 12/31/19 2156  . ondansetron (ZOFRAN) tablet 4 mg  4 mg Oral Q6H PRN McBane, Maylene Roes, PA-C       Or  . ondansetron (ZOFRAN) injection 4 mg  4 mg Intravenous Q6H PRN McBane, Maylene Roes, PA-C      . oxyCODONE (Oxy IR/ROXICODONE) immediate release tablet 2.5-5 mg  2.5-5 mg Oral Q4H PRN Ethelda Chick, PA-C   5 mg at 12/31/19 1953  . pantoprazole (PROTONIX) EC  tablet 40 mg  40 mg Oral Daily Ethelda Chick, PA-C   40 mg at 01/01/20 1000  . senna-docusate (Senokot-S) tablet 1 tablet  1 tablet Oral QHS PRN McBane, Maylene Roes, PA-C      . sodium bicarbonate tablet 650 mg  650 mg Oral BID Wynetta Fines T, MD   650 mg at 01/01/20 1000     Discharge Medications: Please see discharge summary for a list of discharge medications.  Relevant Imaging Results:  Relevant Lab Results:   Additional Information ss# [id number]  Sharin Mons, RN

## 2020-01-01 NOTE — Progress Notes (Signed)
Physical Therapy Treatment Patient Details Name: Shannon Joyce MRN: [id number] DOB: [birth year] Today's Date: 01/01/2020    History of Present Illness Shannon Joyce is a 86 y.o. female with hypertension. She has been residing at [facility] following a right femur fracture in January of 2019 and has been non-ambulatory since this injury--but has been able to transfer herself in and out of W/C and get around in W/C by herself. She requires a wheelchair. Pt has unwitnessed fall with resultant displaced humerus fracture and now s/p ORIF humeral shaft    PT Comments    Pt admitted for above. She was on the bed pan upon arrival and required max assist +2 for bed mobility, and max assist +1 for squat pivot transfer to chair. She required cueing for hand placement and safety throughout the session. Her LUE remains painful with movement. Her son was present and stated that they would prefer pt to go to [facility]. Spoke with Helene Kelp from [facility]. They will be able to handle her in [facility] memory care facility and will need HH PT, OT, Speech. Agree with this plan given pt's current and prior mobility status. Son inquired about hospital bed and lift, but Helene Kelp confirmed that they have lift and bed/chair alarms. Feel that pt does not require hospital bed at this time. She would continue to benefit from skilled acute PT intervention in order to address deficits.   Follow Up Recommendations  Supervision/Assistance - 24 hour;Home health PT; OT, Speech     Equipment Recommendations  None recommended by PT(memory care unit has appropriate equipment)    Recommendations for Other Services       Precautions / Restrictions Precautions Precautions: Fall Precaution Comments: sling for comfort Required Braces or Orthoses: Sling Restrictions Weight Bearing Restrictions: Yes Other Position/Activity Restrictions: WBAT and use of LUE as tolerated    Mobility  Bed Mobility Overal bed  mobility: Needs Assistance Bed Mobility: Supine to Sit     Supine to sit: Max assist;+2 for physical assistance     General bed mobility comments: max assist +2 to EOB; pt had difficulty using L UE so was not able to pull on bed rails  Transfers Overall transfer level: Needs assistance Equipment used: None Transfers: Squat Pivot Transfers     Squat pivot transfers: Max assist     General transfer comment: pt followed cues for hand placement to complete squat pivot to chair; afterwards, she states "that was wonderful!; per son, this is typically the method of transfer used to get her to her WC at ALF  Ambulation/Gait                 Stairs             Wheelchair Mobility    Modified Rankin (Stroke Patients Only)       Balance Overall balance assessment: Needs assistance Sitting-balance support: Feet supported;Bilateral upper extremity supported Sitting balance-Leahy Scale: Fair Sitting balance - Comments: pt able to sit EOB for donning of gait belt but would not tolerate challenge                                    Cognition Arousal/Alertness: Awake/alert Behavior During Therapy: Flat affect Overall Cognitive Status: Impaired/Different from baseline Area of Impairment: Following commands;Safety/judgement;Problem solving  Following Commands: Follows one step commands inconsistently Safety/Judgement: Decreased awareness of safety;Decreased awareness of deficits   Problem Solving: Slow processing;Decreased initiation;Difficulty sequencing;Requires verbal cues;Requires tactile cues General Comments: pt with poor safety awareness, flat affect throughout, required repeated cueing      Exercises      General Comments        Pertinent Vitals/Pain Pain Assessment: Faces Faces Pain Scale: Hurts a little bit Pain Location: movements with LUE at elbow Pain Descriptors / Indicators: Grimacing Pain  Intervention(s): Limited activity within patient's tolerance;Monitored during session;Repositioned    Home Living                      Prior Function            PT Goals (current goals can now be found in the care plan section) Acute Rehab PT Goals Patient Stated Goal: son--for pt to go back to [facility] ALF PT Goal Formulation: With family Time For Goal Achievement: 01/12/20 Potential to Achieve Goals: Fair Progress towards PT goals: Progressing toward goals    Frequency    Min 3X/week      PT Plan Discharge plan needs to be updated    Co-evaluation              AM-PAC PT "6 Clicks" Mobility   Outcome Measure  Help needed turning from your back to your side while in a flat bed without using bedrails?: Total Help needed moving from lying on your back to sitting on the side of a flat bed without using bedrails?: Total Help needed moving to and from a bed to a chair (including a wheelchair)?: Total Help needed standing up from a chair using your arms (e.g., wheelchair or bedside chair)?: Total Help needed to walk in hospital room?: Total Help needed climbing 3-5 steps with a railing? : Total 6 Click Score: 6    End of Session Equipment Utilized During Treatment: Gait belt Activity Tolerance: Patient limited by fatigue Patient left: in chair;with call bell/phone within reach;with chair alarm set Nurse Communication: Mobility status PT Visit Diagnosis: Muscle weakness (generalized) (M62.81)     Time: [id number] PT Time Calculation (min) (ACUTE ONLY): 20 min  Charges:  $Therapeutic Activity: 8-22 mins                     Shannon Joyce, Shannon Joyce    Shannon Joyce 01/01/2020, 12:30 PM

## 2020-01-01 NOTE — TOC Progression Note (Addendum)
Transition of Care [facility]) - Progression Note    Patient Details  Name: Shannon Joyce MRN: [id number] Date of Birth: Apr 09, 1925  Transition of Care [facility]) CM/SW Contact  Sharin Mons, RN Phone Number: 01/01/2020, 2:24 PM  Clinical Narrative:     NCM received consult for possible SNF placement at time of discharge.NCM spoke with patient's son Clance Boll @ bedside regarding PT recommendation of SNF placement at time of discharge However, Clance Boll states he would like for mom to d/c back to [facility] ALF/ memeory care unit  with increased therapy.    NCM spoke with [facility] [phone]) and confirmed  ALF is able to receive pt back to memory care unit with increased therapy.    NCM faxed unsigned FL-2 to [facility] for reviewing.....   TOC team will continue to monitor for needs.   01/01/2020 @ 4pm  NCM received call from Rebecca @ [phone]. Helene Kelp stated they will be ready to received pt in am to memory unit. MD, nurse and son made aware.  Lyda Lundin [facility])     [phone]      Expected Discharge Plan: Assisted Living([facility])    Expected Discharge Plan and Services Expected Discharge Plan: Assisted Living([facility])                                               Social Determinants of Health (SDOH) Interventions    Readmission Risk Interventions No flowsheet data found.

## 2020-01-01 NOTE — Progress Notes (Signed)
Initial Nutrition Assessment  DOCUMENTATION CODES:   Not applicable  INTERVENTION:   -MVI with minerals daily -Magic cup TID between meals, each supplement provides 290 kcal and 9 grams of protein -Magic cup TID with meals, each supplement provides 290 kcal and 9 grams of protein -[place] yogurt and vanilla pudding with meals  NUTRITION DIAGNOSIS:   Inadequate oral intake related to dysphagia, decreased appetite as evidenced by meal completion < 50%, per patient/family report.  GOAL:   Patient will meet greater than or equal to 90% of their needs  MONITOR:   PO intake, Supplement acceptance, Diet advancement, Labs, Weight trends, Skin, I & O's  REASON FOR ASSESSMENT:   Consult Assessment of nutrition requirement/status  ASSESSMENT:   Shannon Joyce is a 86 y.o. female with hypertension. She has been residing at [facility] following a right femur fracture. According to chart review, she sustained a right femur fracture in January of 2019. Per patient's son, Shannon Joyce, patient has been non-ambulatory since this injury. She requires a wheelchair. She has been decompensating since this injury. She had an unwitnessed fall at [facility]. She was trying to get to the bathroom. She lost her footing and fell against the toilet. EMS was called. There was an obvious deformity of the left upper extremity. Arm was reduced and splinted. She was taken to [facility] Emergency Department. She was given Fentanyl by EMS and Morphine in the Emergency Department.  Pt admitted with lt humerus fracture s/p fall.   1/15- s/p Procedure: Left humeral shaft open reduction internal fixation 1/16- s/p BSE- advanced to regular diet with thin liquids 1/18- s/p BSE- diet downgrade to dysphagia 1 diet with nectar thick liquids due to AMS; s/p MBSS- advanced to dysphagia 1 diet with pudding thick liquids; MRI of brain negative for acute stroke  Reviewed I/O's: 0 ml x 24 hours and +1.8 L since admission  UOP: 300 ml x 24  hours  Case discussed with physical therapy, who reports pt tolerated transfer to recliner chair well during session.   Spoke with pt and son, Shannon Joyce. Son expressed concern over poor oral intake; observed meal tray, which revealed that pt consumed a few bites of fruit and beef and a bite of Magic cup. Pt son reports intake has been variable over the past few years and that pt "was living off of Ensure" over a year due to taste changes. Per son, this has improved, but pt is often a selective eater. Documented meal completion 10-50%.   He suspects pt has lost weight, but unsure of UBW. Due to variable intake, he suspects weight has also fluctuated. Noted weight has been stable.   Discussed with pt son rationale for dysphagia 1 diet with pudding thick liquids and explained pt is is receiving IV fluids for additional hydration. Pt complains of wanting something to drink; discussed options with pt son and also obtained food preferences to assist with oral intake (pt only likes vanilla and strawberry flavored foods and enjoys pudding, yogurt, and ice cream). Also discussed potential for diet advancement with progression for SLP.   Per son, pt will discharge back to [facility] today (however, no notes indicate this plan per chart review).   Medications reviewed and include lactated ringers infusion @ 50 ml/hr.   Labs reviewed.   NUTRITION - FOCUSED PHYSICAL EXAM:    Most Recent Value  Orbital Region  Mild depletion  Upper Arm Region  No depletion  Thoracic and Lumbar Region  No depletion  Buccal  Region  No depletion  Temple Region  No depletion  Clavicle Bone Region  No depletion  Clavicle and Acromion Bone Region  No depletion  Scapular Bone Region  No depletion  Dorsal Hand  Mild depletion  Patellar Region  No depletion  Anterior Thigh Region  No depletion  Posterior Calf Region  No depletion  Edema (RD Assessment)  Mild  Hair  Reviewed  Eyes  Reviewed  Mouth  Reviewed  Skin   Reviewed  Nails  Reviewed       Diet Order:   Diet Order            DIET - DYS 1 Room service appropriate? No; Fluid consistency: Pudding Thick  Diet effective now              EDUCATION NEEDS:   Education needs have been addressed  Skin:  Skin Assessment: Skin Integrity Issues: Skin Integrity Issues:: Incisions Incisions: closed lt arm  Last BM:  01/01/20  Height:   Ht Readings from Last 1 Encounters:  12/27/19 5\' 4"  (1.626 m)    Weight:   Wt Readings from Last 1 Encounters:  12/27/19 52.2 kg    Ideal Body Weight:  54.5 kg  BMI:  Body mass index is 19.74 kg/m.  Estimated Nutritional Needs:   Kcal:  1300-1500  Protein:  55-70 grams  Fluid:  > 1.3 L    Shannon Joyce A. Jimmye Norman, RD, LDN, [place] Registered Dietitian II Certified Diabetes Care and Education Specialist Pager: [phone] After hours Pager: [phone]

## 2020-01-01 NOTE — Plan of Care (Signed)
  Problem: Education: Goal: Knowledge of General Education information will improve Description: Including pain rating scale, medication(s)/side effects and non-pharmacologic comfort measures Outcome: Progressing   Problem: Health Behavior/Discharge Planning: Goal: Ability to manage health-related needs will improve Outcome: Progressing   Problem: Elimination: Goal: Will not experience complications related to bowel motility Outcome: Progressing   Problem: Pain Managment: Goal: General experience of comfort will improve Outcome: Progressing   Problem: Safety: Goal: Ability to remain free from injury will improve Outcome: Progressing   Problem: Skin Integrity: Goal: Risk for impaired skin integrity will decrease Outcome: Progressing   

## 2020-01-02 ENCOUNTER — Inpatient Hospital Stay (HOSPITAL_COMMUNITY): Payer: Medicare Other

## 2020-01-02 LAB — CBC
HCT: 24.9 % — ABNORMAL LOW (ref 36.0–46.0)
Hemoglobin: 7.8 g/dL — ABNORMAL LOW (ref 12.0–15.0)
MCH: 34.4 pg — ABNORMAL HIGH (ref 26.0–34.0)
MCHC: 31.3 g/dL (ref 30.0–36.0)
MCV: 109.7 fL — ABNORMAL HIGH (ref 80.0–100.0)
Platelets: 181 10*3/uL (ref 150–400)
RBC: 2.27 MIL/uL — ABNORMAL LOW (ref 3.87–5.11)
RDW: 14.6 % (ref 11.5–15.5)
WBC: 8.1 10*3/uL (ref 4.0–10.5)
nRBC: 0.5 % — ABNORMAL HIGH (ref 0.0–0.2)

## 2020-01-02 MED ORDER — METOPROLOL SUCCINATE ER 25 MG PO TB24: 12.5000 mg | ORAL_TABLET | Freq: Two times a day (BID) | ORAL | 0 refills | Status: AC

## 2020-01-02 NOTE — Progress Notes (Signed)
[facility] was contacted and I spoke with [facility].  She was unable to find a nurse to take the patient report.  I have left my name and direct number for contact.  Magnolia was made aware that [facility] has been scheduled to arrive at any time for transport  The son has been made aware of this information

## 2020-01-02 NOTE — Progress Notes (Signed)
  Speech Language Pathology Treatment: Dysphagia  Patient Details Name: Shannon Joyce MRN: [id number] DOB: 1925-08-29 Today's Date: 01/02/2020 Time: [id number] SLP Time Calculation (min) (ACUTE ONLY): 30 min  Assessment / Plan / Recommendation Clinical Impression  Pt is scheduled for D/C back to [facility] today.  She is still on pudding-thick liquids, which is too restrictive and will prohibit adequate hydration at ALF.  Recommend repeating MBS today prior to D/C. Spoke with CM who agrees with plan and will defer D/C until MBS can be completed. Scheduled for noon today.  Pt continues to cough intermittently with thin liquids.  Her son, Shannon Joyce, posited that his mother seems to be approaching baseline, and that coughing with PO intake is not unusual. We discussed possible outcomes after today's study: improvement, which will make further discussions/decisions unnecessary, or persisting dysphagia with aspiration.  We discussed aspiration in the context of dementia, AMS, and general debility, and that we can do our best to minimize aspiration but prevention is an impossibility.  He verbalized understanding.    Informed RN of plan for swallow study.    HPI HPI: 86 year old female admitted from SNF with displaced humerus fracture s/p repair 1/15. OT notes holding food in mouth during po intake. SLP consulted.       SLP Plan  MBS       Recommendations   continue current diet pending MBS                Oral Care Recommendations: Oral care BID Follow up Recommendations: Skilled Nursing facility SLP Visit Diagnosis: Dysphagia, oropharyngeal phase (R13.12) Plan: MBS       Shannon Joyce, [place] CCC/SLP Acute Rehabilitation Services Office number [phone] Pager [phone]                Shannon Joyce 01/02/2020, 11:00 AM

## 2020-01-02 NOTE — Discharge Summary (Signed)
Patient ID: Shannon Joyce MRN: [id number] DOB/AGE: Sep 29, 1925 86 y.o.  Admit date: 12/27/2019 Discharge date: 01/02/2020  Admission Diagnoses: Displaced left midshaft humerus fracture  Discharge Diagnoses:  Active Problems:   Fracture of humeral shaft, left, closed   Displaced fracture of shaft of left humerus   AKI (acute kidney injury) ([place])   Past Medical History:  Diagnosis Date  . Chronic kidney disease   . Hypertension   . UTI (lower urinary tract infection)    Procedures Performed:  ORIF left humeral shaft 12/27/2019 Modified Barium Swallow Study 12/30/2019  Discharged Condition: stable  Hospital Course: Patient was admitted to [facility] for a displaced left midshaft humerus fracture that required surgery. ORIF of left humerus was performed on 12/27/2019. Patient was kept overnight for monitoring of pain control, medical management, and post discharge planning. On 12/29/2019, patient was noted to have right eye deviation which seemed to be different from the day before. Code stroke was ordered and neurology was called. Neurology work-up was negative for acute stroke. Patient's kidney function began to worsen and [facility] team was consulted for medical co-management. Kidney function improved with hydration. Due to changes in her blood pressure, metoprolol was changed from 25 mg QD to 12.5 mg BID. There was concerned for dysphagia, so MBS was ordered. Speech Pathology following. Patient's mental status improved over the course of her hospital stay. She began complaining of right leg pain. X-rays of hip and knee showed no acute osseous abnormalities. She began to be more interactive and responsive. She was found stable for discharge. Discharge to SNF.   Consults: Neurology, Hospitalist, Speech & Language Pathology, PT/OT  Significant Diagnostic Studies:  MBS, Per Speech Pathology note: "Pt presents with moderately severe oropharyngeal dysphagia with resultant silent  aspiration of thin liquid and nectar-thick liquid, deep laryngeal penetration with possible aspiration with honey-thick liquid, and stagnant trace laryngeal penetration of puree.  Laryngeal penetration and aspiration were related to generalized oropharyngeal weakness, premature spillage to the laryngeal vestibule, and delayed laryngeal closure.  Oral phase was remarkable for weak lingual manipulation, prolonged AP transport, reduced lingual control resulting in premature spillage to the pharynx, and reduced lingual strength resulting in oral residue.  Pharyngeal phase was remarkable for reduced BOT retraction and reduced hyolaryngeal elevation/excursion resulting in mod-severe vallecular residue.  Pt presented with severe vallecular residue with puree trials that she was unable to effectively clear despite multiple cued dry swallows.  Pt is at an elevated risk for aspiration with all PO intake at this time.   Showed pt's son this MBS playback and spoke with him in depth regarding MBS results.  Discussed NPO with consideration of alternative means of nutrition vs Dysphagia 1 (puree) solids and pudding thick liquids with known risk of aspiration and he opted for PO intake.  Therefore, recommend Dysphagia 1 (puree) solids and pudding thick liquids with known aspiration risk and strict adherence to the following compensatory strategies: 1) Small bites/sips 2) Slow rate of intake 3) Sit upright as possible 4) Remain upright for 30+ minutes after intake 5) Multiple dry swallows after each bite 6) Clear throat intermittently.  Pt may have a few ice chips before or 30+ minutes after meals following through oral care with full supervision."  CT/MRI brain, per neurology note: "CT head No acute abnormality. Anterior R frontal lobe infarct chronic. Slight progressions of small vessel disease and atrophy. ASPECTS 9.  MRI  Motion degraded. 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 Mild small vessel disease."  Post-op x-ray left humerus:  "IMPRESSION: Satisfactory appearance of left humeral fracture status post ORIF. No evidence of postoperative complication."  Treatments: ORIF left humeral shaft 12/27/2019  Discharge Exam: General: Elderly female resting comfortably in hospital bed, NAD Resp: Respiratory effort normal Cardio: No pedal edema ABD: soft Neurological: She follows some commands, keeps her eyes closed most of the time MSK: LUE: Incision C/D/I. Steri-strips in place. Some ecchymosis surrounding distal half of incision site. ROM limited due to patient's cooperation this morning. Some swelling about the left hand. She is able to wiggle her fingers. She has been able to demonstrate + Motor for ulnar distributions, rest of motor examination was limited due to her cooperation. She endorses sensation about medial, radial, and ulnar distributions. Well perfused digits.  LLE: She can actively extend and flex knee, intact EHL/TA/GSC, compartments soft, no pain with passive stretch RLE: some TTP about hip and knee, no pain about calf, she moves her ankle and foot without pain, compartments soft, she endorses sensation distally  Disposition: Discharge disposition: 03-Skilled Nursing Facility     SNF Post-op medications: Prescriptions for Lovenox and Oxycodone printed and placed in patient's chart for discharge.   Recommendations per consults: Delirium: Neurology work-up negative for acute stroke. Appreciate consultation. Likely post-op confusion following surgery in combination with age, underlying cognitive impairment, narcotic effect, renal failure.  Dysphagia: Appreciate speech pathology evaluation and advice. Per SLP note:  "Diet Recommendations: Dysphagia 1 (Puree) solids;Pudding thick liquid. Liquid Administration via: Spoon. Medication Administration: Crushed with puree. Supervision: Full assist for feeding;Full supervision/cueing for compensatory strategies."  Medical co-management: Appreciate Hospitalist team  consultation and advice. Per Hospitalist, Dr. Hollice Gong, note: "She is medically stable and does not need further inpatient acute care for her medical issues. I would recommend to recheck her renal function within one week, as it is improving with hydration. I would also discharge her on Metoprolol BID with holding parameters as currently ordered."  Follow-up: 4 weeks in office with Dr. Griffin Basil  Discharge Instructions    Ambulatory Referral for DME   Complete by: As directed    Call MD for:  redness, tenderness, or signs of infection (pain, swelling, redness, odor or green/yellow discharge around incision site)   Complete by: As directed    Call MD for:  severe uncontrolled pain   Complete by: As directed    Call MD for:  temperature >100.4   Complete by: As directed    Diet - low sodium heart healthy   Complete by: As directed    No dressing needed   Complete by: As directed      Allergies as of 01/02/2020      Reactions   Clinoril [sulindac] Other (See Comments)   Hives or swelling   Darvon [propoxyphene] Other (See Comments)   Hives or swelling   Levaquin [levofloxacin In D5w] Swelling   Meropenem Rash   Penicillins Swelling, Other (See Comments)   Did it involve swelling of the face/tongue/throat, SOB, or low BP? Yes Did it involve sudden or severe rash/hives, skin peeling, or any reaction on the inside of your mouth or nose? Yes Did you need to seek medical attention at a hospital or doctor's office? Yes When did it last happen? If all above answers are "NO", may proceed with cephalosporin use.   Sulfa Antibiotics Other (See Comments)   Hives or swelling   Cefadroxil Other (See Comments)   unknown unknown   Gentamicin Other (See Comments)   unknown unknown  Medication List    STOP taking these medications   HYDROcodone-acetaminophen 5-325 MG tablet Commonly known as: NORCO/VICODIN     TAKE these medications   acetaminophen 650 MG CR tablet Commonly  known as: TYLENOL Take 650 mg by mouth every 8 (eight) hours as needed for pain.   enoxaparin 40 MG/0.4ML injection Commonly known as: LOVENOX Inject 0.3 mLs (30 mg total) into the skin daily.   Ensure Plus Liqd Take 237 mLs by mouth 3 (three) times daily between meals.   ibuprofen 200 MG tablet Commonly known as: ADVIL Take 400 mg by mouth every 8 (eight) hours as needed for moderate pain.   levothyroxine 25 MCG tablet Commonly known as: SYNTHROID Take 12.5 mcg by mouth daily.   metoprolol succinate 25 MG 24 hr tablet Commonly known as: TOPROL-XL Take 0.5 tablets (12.5 mg total) by mouth 2 (two) times daily. What changed:   how much to take  when to take this   mirtazapine 7.5 MG tablet Commonly known as: REMERON Take 7.5 mg by mouth at bedtime.   nitrofurantoin 50 MG capsule Commonly known as: MACRODANTIN Take 50 mg by mouth at bedtime. Continuous course   omeprazole 20 MG capsule Commonly known as: PRILOSEC Take 20 mg by mouth daily.   oxyCODONE 5 MG immediate release tablet Commonly known as: Oxy IR/ROXICODONE Take 0.5-1 pill every 6 hrs as needed for pain   senna 8.6 MG Tabs tablet Commonly known as: SENOKOT Take 1 tablet by mouth daily as needed for mild constipation.            Durable Medical Equipment  (From admission, onward)         Start     Ordered   01/01/20 1201  For home use only DME Other see comment  Once    Comments: Harrel Lemon liff  Question:  Length of Need  Answer:  12 Months   01/01/20 1200   01/01/20 1200  For home use only DME Hospital bed  Once    Question Answer Comment  Length of Need 12 Months   Patient has (list medical condition): Hx of dementia, current falls, CKD, hypertension, osteoarthritis, s/p recent ORIF humeral shaft   The above medical condition requires: Patient requires the ability to reposition frequently   Bed type Semi-electric   Support Surface: Gel Overlay      01/01/20 1200   01/01/20 1112  For home use  only DME Hospital bed  Once    Question Answer Comment  Length of Need 6 Months   The above medical condition requires: Patient requires the ability to reposition frequently   Bed type Semi-electric      01/01/20 [place], Dax T, MD In 4 weeks.   Specialty: Orthopedic Surgery Why: For wound re-check Contact information: [address] [place] [address] [phone]

## 2020-01-02 NOTE — Progress Notes (Signed)
PROGRESS NOTE    Shannon Joyce  [id number] DOB: 1925-08-09 DOA: 12/27/2019 PCP: Patient, No Pcp Per   Brief Narrative:  86 year old female with a history of dementia, current falls, CKD, hypertension, osteoarthritis, presented to the hospital with fall and noted to have oblique fracture of the mid shaft of the left humerus.  Patient is undergone internal fixation.  Medical team was consulted to assist with medical management.   Assessment & Plan   Acute kidney injury on chronic kidney disease, stage IV -Creatinine peaked to 2.75, down to 1.83 -Patient was placed on IV fluids -Continue sodium bicarb -would repeat BMP in one week  Chronic urinary tract infection -With no complaints of dysuria -UA showed many bacteria, >50 WBC, large leukocytes. 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  Recurrent falls with recent left humeral fracture -Status post internal fixation -Disposition per primary team  Nutrition/failure to thrive -Dietary consulted -Continue IV hydration -May consider palliative care follow-up on discharge  Hypothyroidism -Continue Synthroid  DVT Prophylaxis Lovenox  Code Status: DNR  Family Communication: None at bedside  Disposition Plan: Admitted.  Disposition per primary team.  Consultants [facility] Neurology  Procedures  Left humeral shaft open reduction internal fixation  Antibiotics   Anti-infectives (From admission,  onward)   Start     Dose/Rate Route Frequency Ordered Stop   12/28/19 0400  vancomycin (VANCOCIN) IVPB 1000 mg/200 mL premix     1,000 mg 200 mL/hr over 60 Minutes Intravenous Every 12 hours 12/27/19 1810 12/28/19 0527   12/27/19 2200  nitrofurantoin (MACRODANTIN) capsule 50 mg  Status:  Discontinued    Note to Pharmacy: Continuous course     50 mg Oral Daily at bedtime 12/27/19 0911 12/29/19 1433   12/27/19 1606  vancomycin (VANCOCIN) powder  Status:  Discontinued       As needed 12/27/19 1607 12/27/19 1636   12/27/19 1345  vancomycin (VANCOCIN) IVPB 1000 mg/200 mL premix     1,000 mg 200 mL/hr over 60 Minutes Intravenous On call to O.R. 12/27/19 1337 12/27/19 1555   12/27/19 1340  vancomycin (VANCOCIN) 1-5 GM/200ML-% IVPB    Note to Pharmacy: Nyoka Cowden   : cabinet override      12/27/19 1340 12/27/19 1502      Subjective:   Shannon Joyce seen and examined today.  No complaints today.  Objective:   Vitals:   01/01/20 1948 01/02/20 0300 01/02/20 0841 01/02/20 0949  BP: (!) 143/69 106/79 139/62 139/62  Pulse: 98 92 88 88  Resp: 18 15    Temp: 98 F (36.7 C) 97.9 F (36.6 C) 98.5 F (36.9 C)   TempSrc: Oral Axillary Oral   SpO2: 93% 95% 96%   Weight:      Height:        Intake/Output Summary (Last 24 hours) at 01/02/2020 1104 Last data filed at 01/02/2020 0300 Gross per 24 hour  Intake 200 ml  Output 1150 ml  Net -950 ml   Filed Weights   12/27/19 0506  Weight: 52.2 kg  Exam  General: Well developed, elderly, think  HEENT: NCAT, mucous membranes moist.   Neuro: AAOx3, nonfocal  Psych:Appropriate   Data Reviewed: I have personally reviewed following labs and imaging studies  CBC: Recent Labs  Lab 12/28/19 0339 12/29/19 0747 12/30/19 0948 12/31/19 0720 01/02/20 0318  WBC 13.9* 16.5* 12.6* 11.2* 8.1  NEUTROABS  --   --   --  8.2*  --   HGB 7.6* 7.9* 7.7* 8.2* 7.8*  HCT 24.6* 26.1* 25.1* 27.7* 24.9*  MCV 112.3* 114.0* 114.1* 114.0* 109.7*  PLT  160 163 182 183 0000000   Basic Metabolic Panel: Recent Labs  Lab 12/27/19 1124 12/28/19 0339 12/29/19 0747 12/30/19 0415 12/31/19 0720  NA 144 138 140 141 144  K 5.1 5.7* 5.3* 5.1 4.4  CL 116* 112* 113* 113* 114*  CO2 16* 16* 17* 18* 19*  GLUCOSE 143* 148* 108* 98 89  BUN 45* 40* 52* 51* 41*  CREATININE 2.75* 2.15* 2.58* 2.29* 1.83*  CALCIUM 9.1 8.3* 8.4* 8.4* 8.3*  MG  --   --   --   --  2.0  PHOS  --   --   --  3.9 2.8   GFR: Estimated Creatinine Clearance: 16.2 mL/min (A) (by C-G formula based on SCr of 1.83 mg/dL (H)). Liver Function Tests: Recent Labs  Lab 12/27/19 1124 12/31/19 0720  AST 23  --   ALT 13  --   ALKPHOS 551*  --   BILITOT 0.5  --   PROT 6.4*  --   ALBUMIN 2.8* 2.3*   No results for input(s): LIPASE, AMYLASE in the last 168 hours. No results for input(s): AMMONIA in the last 168 hours. Coagulation Profile: Recent Labs  Lab 12/27/19 1124  INR 1.1   Cardiac Enzymes: No results for input(s): CKTOTAL, CKMB, CKMBINDEX, TROPONINI in the last 168 hours. BNP (last 3 results) No results for input(s): PROBNP in the last 8760 hours. HbA1C: No results for input(s): HGBA1C in the last 72 hours. CBG: No results for input(s): GLUCAP in the last 168 hours. Lipid Profile: No results for input(s): CHOL, HDL, LDLCALC, TRIG, CHOLHDL, LDLDIRECT in the last 72 hours. Thyroid Function Tests: No results for input(s): TSH, T4TOTAL, FREET4, T3FREE, THYROIDAB in the last 72 hours. Anemia Panel: No results for input(s): VITAMINB12, FOLATE, FERRITIN, TIBC, IRON, RETICCTPCT in the last 72 hours. Urine analysis:    Component Value Date/Time   COLORURINE YELLOW 12/30/2019 1419   APPEARANCEUR CLOUDY (A) 12/30/2019 1419   LABSPEC 1.014 12/30/2019 1419   PHURINE 6.0 12/30/2019 1419   GLUCOSEU NEGATIVE 12/30/2019 1419   HGBUR MODERATE (A) 12/30/2019 1419   BILIRUBINUR NEGATIVE 12/30/2019 1419   KETONESUR 5 (A) 12/30/2019 1419   PROTEINUR 30 (A) 12/30/2019 1419    NITRITE NEGATIVE 12/30/2019 1419   LEUKOCYTESUR LARGE (A) 12/30/2019 1419   Sepsis Labs: @LABRCNTIP (procalcitonin:4,lacticidven:4)  ) Recent Results (from the past 240 hour(s))  Respiratory Panel by RT PCR (Flu A&B, Covid) - Nasopharyngeal Swab     Status: None   Collection Time: 12/27/19  6:38 AM   Specimen: Nasopharyngeal Swab  Result Value Ref Range Status   SARS Coronavirus 2 by RT PCR NEGATIVE NEGATIVE Final    Comment: (NOTE) SARS-CoV-2 target nucleic acids are NOT DETECTED. The SARS-CoV-2 RNA is generally detectable in upper respiratoy specimens during the acute phase of infection. The lowest concentration of SARS-CoV-2 viral copies this assay can detect is 131 copies/mL. A negative result does not preclude SARS-Cov-2 infection and should not  be used as the sole basis for treatment or other patient management decisions. A negative result may occur with  improper specimen collection/handling, submission of specimen other than nasopharyngeal swab, presence of viral mutation(s) within the areas targeted by this assay, and inadequate number of viral copies (<131 copies/mL). A negative result must be combined with clinical observations, patient history, and epidemiological information. The expected result is Negative. Fact Sheet for Patients:  PinkCheek.be Fact Sheet for Healthcare Providers:  GravelBags.it This test is not yet ap proved or cleared by the [place] FDA and  has been authorized for detection and/or diagnosis of SARS-CoV-2 by FDA under an Emergency Use Authorization (EUA). This EUA will remain  in effect (meaning this test can be used) for the duration of the COVID-19 declaration under Section 564(b)(1) of the Act, 21 U.S.C. section 360bbb-3(b)(1), unless the authorization is terminated or revoked sooner.    Influenza A by PCR NEGATIVE NEGATIVE Final   Influenza B by PCR NEGATIVE NEGATIVE Final     Comment: (NOTE) The Xpert Xpress SARS-CoV-2/FLU/RSV assay is intended as an aid in  the diagnosis of influenza from Nasopharyngeal swab specimens and  should not be used as a sole basis for treatment. Nasal washings and  aspirates are unacceptable for Xpert Xpress SARS-CoV-2/FLU/RSV  testing. Fact Sheet for Patients: PinkCheek.be Fact Sheet for Healthcare Providers: GravelBags.it This test is not yet approved or cleared by the [place] FDA and  has been authorized for detection and/or diagnosis of SARS-CoV-2 by  FDA under an Emergency Use Authorization (EUA). This EUA will remain  in effect (meaning this test can be used) for the duration of the  Covid-19 declaration under Section 564(b)(1) of the Act, 21  U.S.C. section 360bbb-3(b)(1), unless the authorization is  terminated or revoked. Performed at [facility], [place] [address], [place], [place] [address]   Surgical pcr screen     Status: None   Collection Time: 12/27/19  9:11 AM   Specimen: Nasal Mucosa; Nasal Swab  Result Value Ref Range Status   MRSA, PCR NEGATIVE NEGATIVE Final   Staphylococcus aureus NEGATIVE NEGATIVE Final    Comment: (NOTE) The Xpert SA Assay (FDA approved for NASAL specimens in patients 39 years of age and older), is one component of a comprehensive surveillance program. It is not intended to diagnose infection nor to guide or monitor treatment. Performed at [facility], [place] [address], [place], [place] [address]   SARS CORONAVIRUS 2 (TAT 6-24 HRS) Nasopharyngeal Nasopharyngeal Swab     Status: None   Collection Time: 01/01/20  5:20 AM   Specimen: Nasopharyngeal Swab  Result Value Ref Range Status   SARS Coronavirus 2 NEGATIVE NEGATIVE Final    Comment: (NOTE) SARS-CoV-2 target nucleic acids are NOT DETECTED. The SARS-CoV-2 RNA is generally detectable in upper and lower respiratory specimens during the acute phase of  infection. Negative results do not preclude SARS-CoV-2 infection, do not rule out co-infections with other pathogens, and should not be used as the sole basis for treatment or other patient management decisions. Negative results must be combined with clinical observations, patient history, and epidemiological information. The expected result is Negative. Fact Sheet for Patients: SugarRoll.be Fact Sheet for Healthcare Providers: https://www.woods-mathews.com/ This test is not yet approved or cleared by the [place] FDA and  has been authorized for detection and/or diagnosis of SARS-CoV-2 by FDA under an Emergency Use Authorization (EUA). This EUA will remain  in effect (meaning this test can be  used) for the duration of the COVID-19 declaration under Section 56 4(b)(1) of the Act, 21 U.S.C. section 360bbb-3(b)(1), unless the authorization is terminated or revoked sooner. Performed at [facility], [place] [address] [place], [place] [address]       Radiology Studies: DG Humerus Left  Result Date: 01/02/2020 CLINICAL DATA:  Left arm pain EXAM: LEFT HUMERUS - 2+ VIEW COMPARISON:  12/27/2019 FINDINGS: Sideplate and screw fixation construct fix a oblique left humeral diaphyseal fracture. Alignment remains near anatomic. Shoulder and elbow joint alignment is maintained. Chronic posttraumatic deformity of the distal clavicle. Postoperative changes within the soft tissues. IMPRESSION: Satisfactory appearance of left humeral fracture status post ORIF. No evidence of postoperative complication. Electronically Signed   By: Davina Poke D.O.   On: 01/02/2020 08:01     Scheduled Meds: . calcium-vitamin D  1 tablet Oral BID  . docusate sodium  100 mg Oral BID  . enoxaparin (LOVENOX) injection  30 mg Subcutaneous Q24H  . levothyroxine  12.5 mcg Oral Daily  . metoprolol tartrate  12.5 mg Oral BID  . mirtazapine  7.5 mg Oral QHS  . pantoprazole   40 mg Oral Daily  . sodium bicarbonate  650 mg Oral BID   Continuous Infusions: . lactated ringers 50 mL/hr at 12/31/19 2350     LOS: 5 days   Time Spent in minutes   30 minutes  Amaani Guilbault D.O. on 01/02/2020 at 11:04 AM  Between 7am to 7pm - Please see pager noted on [email or web address]  After 7pm go to [email or web address]  And look for the night coverage person covering for me after hours  [facility] Office  [phone]

## 2020-01-02 NOTE — Progress Notes (Signed)
Modified Barium Swallow Progress Note  Patient Details  Name: Shannon Joyce MRN: [id number] Date of Birth: [birth year]  Today's Date: 01/02/2020  Modified Barium Swallow completed.  Full report located under Chart Review in the Imaging Section.  Brief recommendations include the following:  Clinical Impression  Pt presents with significantly improved swallow function, likely related in part to improved MS, with adequate oral manipulation/ control of liquids and solids; generally reliable laryngeal-vestibule closure excluding <5% occasions when thin liquids slipped into the laryngeal vestibule before the epiglottis could fully invert.  Aspiration was trace, and it elicited a cough response.  There was mild vallecular residue with solids but it cleared easily; overall, pharyngeal clearance was much improved since last study.  Discussed results with pt's son, Shannon Joyce, over the phone.  Recommend pt resume a mechanical soft diet; allow thin liquids. Take 2-3 sips at a time (straws ok), but avoid large, successive boluses, as this was the circumstance that led to trace aspiration.  Pt's swallow is quite functional overall.  No further SLP f/u needed.    Swallow Evaluation Recommendations       SLP Diet Recommendations: Dysphagia 3 (Mech soft) solids;Thin liquid   Liquid Administration via: Cup;Straw   Medication Administration: Crushed with puree   Supervision: Patient able to self feed   Compensations: Small sips/bites       Oral Care Recommendations: Oral care BID      [name] L. Tivis Ringer, [place] Office number [phone] Pager [phone]   Juan Quam Laurice 01/02/2020,12:58 PM

## 2020-01-02 NOTE — Progress Notes (Signed)
Due to patient's facility not allowing Lovenox we will prescribe aspirin 81 mg once a day until follow up. Patient can follow up in three weeks.

## 2020-01-02 NOTE — Care Management (Addendum)
Spoke w Shannon Joyce at [facility] who needs DC note and FL2 faxed to [phone] prior to patient returning. She has a bed and is otherwise able to return today. I have notified MD, requested DC note. I have notified son Clance Boll of DC plan today. Patient will DC via PTAR.  I will place PTAR forms on chart and call PTAR when the above is complete.   Notified by ALF that they do not give Lovenox. Shannon Joyce at [facility] requested note to be faxed. MD placed note. Faxing now. Will arrange PTAR when received.   14:50, PTAR called, nurse notified, requested to notify patient's son (in room).

## 2020-01-02 NOTE — Progress Notes (Signed)
Helene Kelp (nurse) from [facility] did call back for report.  A discharge packet will be sent via PTAR.  The son is aware of discharge,

## 2020-01-02 NOTE — Discharge Instructions (Signed)
Ophelia Charter MD, MPH [place] [address] [phone] (fax)   POST-OPERATIVE INSTRUCTIONS   WOUND CARE . Leave steri-strips in place until they fall off on their own, usually 2 weeks postop. . If they are still on after two weeks, you may remove . You do not need to put a new dressing on the incision. Keep incision clean and dry. Do not put any creams or lotions over the area. . Use the Cryocuff or Ice as often as possible for the first 7 days, then as needed for pain relief. Always keep a towel, ACE wrap or other barrier between the cooling unit and your skin.  . Do not go swimming in the pool or ocean until 4 weeks after surgery or when otherwise instructed.    EXERCISES/BRACING ? You may weight bear using your left arm as you feel comfortable ? Continue to move your shoulder, elbow, wrist, and hand. This will help with stiffness and swelling. ? Perform the exercises the physical therapist taught you while you were in the hospital.  . Please continue to ambulate and do not stay sitting or lying for too long. Perform foot and wrist pumps to assist in circulation.  POST-OP MEDICATIONS- Multimodal approach to pain control . In general your pain will be controlled with a combination of substances.  Prescriptions unless otherwise discussed are electronically sent to your pharmacy.  This is a carefully made plan we use to minimize narcotic use.    ? Acetaminophen - Non-narcotic pain medicine taken on a scheduled basis  ? Oxycodone - This is a strong narcotic, to be used only on an "as needed" basis for pain. ? Lovenox - This medicine is used to minimize the risk of blood clots after surgery.  FOLLOW-UP . If you develop a Fever (?101.5), Redness or Drainage from the surgical incision site, please call our office to arrange for an evaluation. . Please call the office to schedule a follow-up appointment in 4 weeks.   HELPFUL  INFORMATION  . You may be more comfortable sleeping in a semi-seated position the first few nights following surgery.  Keep a pillow propped under the elbow and forearm for comfort.  If you have a recliner type of chair it might be beneficial.  If not that is fine too, but it would be helpful to sleep propped up with pillows behind your operated shoulder as well under your elbow and forearm.  This will reduce pulling on the suture lines.  . When dressing, put your operative arm in the sleeve first.  When getting undressed, take your operative arm out last.  Loose fitting, button-down shirts are recommended.  Often in the first days after surgery you may be more comfortable keeping your operative arm under your shirt and not through the sleeve.  . You may return to work/school in the next couple of days when you feel up to it.  Desk work and typing in the sling is fine.  . You should wean off your narcotic medicines as soon as you are able.  Most patients will be off or using minimal narcotics before their first postop appointment.   . Do not drink alcoholic beverages or take illicit drugs when taking pain medications.  . It is against the law to drive while taking narcotics.  In some states it is against the law to drive while your arm is in a sling.   . Pain medication may make  you constipated.  Below are a few solutions to try in this order: - Decrease the amount of pain medication if you aren't having pain. - Drink lots of decaffeinated fluids. - Drink prune juice and/or each dried prunes  . If the first 3 don't work start with additional solutions - Take Colace - an over-the-counter stool softener - Take Senokot - an over-the-counter laxative - Take Miralax - a stronger over-the-counter laxative

## 2020-01-02 NOTE — Progress Notes (Signed)
Physical Therapy Treatment Patient Details Name: Shannon Joyce MRN: [id number] DOB: 02-05-1925 Today's Date: 01/02/2020    History of Present Illness Shannon Joyce is a 86 y.o. female with hypertension. She has been residing at [facility] following a right femur fracture in January of 2019 and has been non-ambulatory since this injury--but has been able to transfer herself in and out of W/C and get around in W/C by herself. She requires a wheelchair. Pt has unwitnessed fall with resultant displaced humerus fracture and now s/p ORIF humeral shaft    PT Comments    Pt remains limited secondary to generalized weakness, fatigue and pain. Pt's son present throughout and reporting pt is to d/c back to [facility] today. She continues to require heavy physical assistance of two for bed mobility and transfers. Pt would continue to benefit from skilled physical therapy services at this time while admitted and after d/c to address the below listed limitations in order to improve overall safety and independence with functional mobility.   Follow Up Recommendations  Supervision/Assistance - 24 hour;Home health PT;Other (comment)(return to [facility])     Equipment Recommendations  None recommended by PT    Recommendations for Other Services       Precautions / Restrictions Precautions Precautions: Fall Precaution Comments: sling for comfort Restrictions Weight Bearing Restrictions: Yes    Mobility  Bed Mobility Overal bed mobility: Needs Assistance Bed Mobility: Supine to Sit;Sit to Supine     Supine to sit: Max assist;+2 for physical assistance Sit to supine: Total assist;+2 for physical assistance   General bed mobility comments: pt able to initiate movement of bilateral LEs towards EOB; heavy physical assistance for trunk elevation and to return to supine; use of bed pads to position pt's hips at EOB  Transfers Overall transfer level: Needs assistance Equipment used: 2 person  hand held assist Transfers: Sit to/from Stand Sit to Stand: Max assist;+2 physical assistance         General transfer comment: increased time, 2HHA with heavy physical assistance to power into standing; pt performed x2 from EOB  Ambulation/Gait             General Gait Details: unable to take any steps   Stairs             Wheelchair Mobility    Modified Rankin (Stroke Patients Only)       Balance Overall balance assessment: Needs assistance Sitting-balance support: Feet supported;Bilateral upper extremity supported Sitting balance-Leahy Scale: Poor Sitting balance - Comments: static sitting is fair, dynamic is poor   Standing balance support: Bilateral upper extremity supported;Single extremity supported Standing balance-Leahy Scale: Zero Standing balance comment: max A x2                            Cognition Arousal/Alertness: Awake/alert Behavior During Therapy: Flat affect Overall Cognitive Status: Impaired/Different from baseline Area of Impairment: Following commands;Safety/judgement;Problem solving                       Following Commands: Follows one step commands inconsistently Safety/Judgement: Decreased awareness of safety;Decreased awareness of deficits   Problem Solving: Slow processing;Decreased initiation;Difficulty sequencing;Requires verbal cues;Requires tactile cues        Exercises      General Comments        Pertinent Vitals/Pain Pain Assessment: Faces Faces Pain Scale: Hurts a little bit Pain Location: movements with LUE at elbow Pain Descriptors /  Indicators: Grimacing Pain Intervention(s): Monitored during session;Repositioned    Home Living                      Prior Function            PT Goals (current goals can now be found in the care plan section) Acute Rehab PT Goals PT Goal Formulation: With family Time For Goal Achievement: 01/12/20 Potential to Achieve Goals:  Fair Progress towards PT goals: Progressing toward goals    Frequency    Min 3X/week      PT Plan Current plan remains appropriate    Co-evaluation              AM-PAC PT "6 Clicks" Mobility   Outcome Measure  Help needed turning from your back to your side while in a flat bed without using bedrails?: Total Help needed moving from lying on your back to sitting on the side of a flat bed without using bedrails?: Total Help needed moving to and from a bed to a chair (including a wheelchair)?: Total Help needed standing up from a chair using your arms (e.g., wheelchair or bedside chair)?: Total Help needed to walk in hospital room?: Total Help needed climbing 3-5 steps with a railing? : Total 6 Click Score: 6    End of Session Equipment Utilized During Treatment: Gait belt Activity Tolerance: Patient limited by fatigue Patient left: in bed;with call bell/phone within reach;with family/visitor present Nurse Communication: Mobility status PT Visit Diagnosis: Muscle weakness (generalized) (M62.81)     Time: [id number] PT Time Calculation (min) (ACUTE ONLY): 17 min  Charges:  $Therapeutic Activity: 8-22 mins                     Anastasio Champion, DPT  Acute Rehabilitation Services Pager [phone] Office [place] 01/02/2020, 1:44 PM

## 2020-01-02 NOTE — Progress Notes (Signed)
Dr Griffin Basil notified of the need for Gold DNR record to be completed before discharge

## 2020-01-04 ENCOUNTER — Inpatient Hospital Stay (HOSPITAL_COMMUNITY): Payer: Medicare Other

## 2020-01-04 ENCOUNTER — Inpatient Hospital Stay (HOSPITAL_COMMUNITY)
Admission: EM | Admit: 2020-01-04 | Discharge: 2020-01-08 | DRG: 493 | Disposition: A | Discharge status: Home / Self Care | Payer: Medicare Other | Source: Skilled Nursing Facility | Attending: Orthopedic Surgery | Admitting: Orthopedic Surgery

## 2020-01-04 ENCOUNTER — Emergency Department (HOSPITAL_COMMUNITY): Payer: Medicare Other

## 2020-01-04 ENCOUNTER — Emergency Department (HOSPITAL_COMMUNITY): Payer: Medicare Other | Admitting: Anesthesiology

## 2020-01-04 ENCOUNTER — Encounter (HOSPITAL_COMMUNITY)
Admission: EM | Disposition: A | Discharge status: Skilled Nursing Facility | Payer: Self-pay | Source: Skilled Nursing Facility | Attending: Orthopedic Surgery

## 2020-01-04 DIAGNOSIS — E079 Disorder of thyroid, unspecified: Secondary | ICD-10-CM | POA: Diagnosis present

## 2020-01-04 DIAGNOSIS — Z90710 Acquired absence of both cervix and uterus: Secondary | ICD-10-CM

## 2020-01-04 DIAGNOSIS — Z9049 Acquired absence of other specified parts of digestive tract: Secondary | ICD-10-CM

## 2020-01-04 DIAGNOSIS — Z20822 Contact with and (suspected) exposure to covid-19: Secondary | ICD-10-CM | POA: Diagnosis present

## 2020-01-04 DIAGNOSIS — S42302B Unspecified fracture of shaft of humerus, left arm, initial encounter for open fracture: Secondary | ICD-10-CM | POA: Diagnosis present

## 2020-01-04 DIAGNOSIS — Y792 Prosthetic and other implants, materials and accessory orthopedic devices associated with adverse incidents: Secondary | ICD-10-CM | POA: Diagnosis present

## 2020-01-04 DIAGNOSIS — N184 Chronic kidney disease, stage 4 (severe): Secondary | ICD-10-CM | POA: Diagnosis present

## 2020-01-04 DIAGNOSIS — Z881 Allergy status to other antibiotic agents status: Secondary | ICD-10-CM

## 2020-01-04 DIAGNOSIS — T84191A Other mechanical complication of internal fixation device of left humerus, initial encounter: Secondary | ICD-10-CM | POA: Diagnosis present

## 2020-01-04 DIAGNOSIS — Z792 Long term (current) use of antibiotics: Secondary | ICD-10-CM

## 2020-01-04 DIAGNOSIS — N189 Chronic kidney disease, unspecified: Secondary | ICD-10-CM | POA: Diagnosis present

## 2020-01-04 DIAGNOSIS — R52 Pain, unspecified: Secondary | ICD-10-CM

## 2020-01-04 DIAGNOSIS — Y831 Surgical operation with implant of artificial internal device as the cause of abnormal reaction of the patient, or of later complication, without mention of misadventure at the time of the procedure: Secondary | ICD-10-CM | POA: Diagnosis present

## 2020-01-04 DIAGNOSIS — Z880 Allergy status to penicillin: Secondary | ICD-10-CM | POA: Diagnosis not present

## 2020-01-04 DIAGNOSIS — R1310 Dysphagia, unspecified: Secondary | ICD-10-CM | POA: Diagnosis present

## 2020-01-04 DIAGNOSIS — S42342B Displaced spiral fracture of shaft of humerus, left arm, initial encounter for open fracture: Secondary | ICD-10-CM | POA: Diagnosis present

## 2020-01-04 DIAGNOSIS — Z993 Dependence on wheelchair: Secondary | ICD-10-CM | POA: Diagnosis not present

## 2020-01-04 DIAGNOSIS — E559 Vitamin D deficiency, unspecified: Secondary | ICD-10-CM | POA: Diagnosis present

## 2020-01-04 DIAGNOSIS — Z79890 Hormone replacement therapy (postmenopausal): Secondary | ICD-10-CM

## 2020-01-04 DIAGNOSIS — B961 Klebsiella pneumoniae [K. pneumoniae] as the cause of diseases classified elsewhere: Secondary | ICD-10-CM | POA: Diagnosis present

## 2020-01-04 DIAGNOSIS — S42302A Unspecified fracture of shaft of humerus, left arm, initial encounter for closed fracture: Secondary | ICD-10-CM | POA: Diagnosis present

## 2020-01-04 DIAGNOSIS — R296 Repeated falls: Secondary | ICD-10-CM | POA: Diagnosis present

## 2020-01-04 DIAGNOSIS — N179 Acute kidney failure, unspecified: Secondary | ICD-10-CM | POA: Diagnosis present

## 2020-01-04 DIAGNOSIS — I129 Hypertensive chronic kidney disease with stage 1 through stage 4 chronic kidney disease, or unspecified chronic kidney disease: Secondary | ICD-10-CM | POA: Diagnosis present

## 2020-01-04 DIAGNOSIS — Z419 Encounter for procedure for purposes other than remedying health state, unspecified: Secondary | ICD-10-CM

## 2020-01-04 DIAGNOSIS — F05 Delirium due to known physiological condition: Secondary | ICD-10-CM | POA: Diagnosis not present

## 2020-01-04 DIAGNOSIS — Z885 Allergy status to narcotic agent status: Secondary | ICD-10-CM

## 2020-01-04 DIAGNOSIS — D62 Acute posthemorrhagic anemia: Secondary | ICD-10-CM | POA: Diagnosis present

## 2020-01-04 DIAGNOSIS — K08109 Complete loss of teeth, unspecified cause, unspecified class: Secondary | ICD-10-CM | POA: Diagnosis present

## 2020-01-04 DIAGNOSIS — F0390 Unspecified dementia without behavioral disturbance: Secondary | ICD-10-CM | POA: Diagnosis present

## 2020-01-04 DIAGNOSIS — Z882 Allergy status to sulfonamides status: Secondary | ICD-10-CM

## 2020-01-04 DIAGNOSIS — Z9181 History of falling: Secondary | ICD-10-CM | POA: Diagnosis not present

## 2020-01-04 DIAGNOSIS — Z79899 Other long term (current) drug therapy: Secondary | ICD-10-CM | POA: Diagnosis not present

## 2020-01-04 DIAGNOSIS — Y92129 Unspecified place in nursing home as the place of occurrence of the external cause: Secondary | ICD-10-CM

## 2020-01-04 DIAGNOSIS — Z09 Encounter for follow-up examination after completed treatment for conditions other than malignant neoplasm: Secondary | ICD-10-CM

## 2020-01-04 DIAGNOSIS — N390 Urinary tract infection, site not specified: Secondary | ICD-10-CM | POA: Diagnosis present

## 2020-01-04 DIAGNOSIS — Z886 Allergy status to analgesic agent status: Secondary | ICD-10-CM

## 2020-01-04 DIAGNOSIS — W19XXXA Unspecified fall, initial encounter: Secondary | ICD-10-CM | POA: Diagnosis present

## 2020-01-04 HISTORY — PX: ORIF HUMERUS FRACTURE: SHX2126

## 2020-01-04 LAB — BASIC METABOLIC PANEL
Anion gap: 10 (ref 5–15)
BUN: 25 mg/dL — ABNORMAL HIGH (ref 8–23)
CO2: 25 mmol/L (ref 22–32)
Calcium: 8.1 mg/dL — ABNORMAL LOW (ref 8.9–10.3)
Chloride: 105 mmol/L (ref 98–111)
Creatinine, Ser: 1.7 mg/dL — ABNORMAL HIGH (ref 0.44–1.00)
GFR calc Af Amer: 30 mL/min — ABNORMAL LOW (ref >60)
GFR calc non Af Amer: 26 mL/min — ABNORMAL LOW (ref >60)
Glucose, Bld: 141 mg/dL — ABNORMAL HIGH (ref 70–99)
Potassium: 3.9 mmol/L (ref 3.5–5.1)
Sodium: 140 mmol/L (ref 135–145)

## 2020-01-04 LAB — CBC WITH DIFFERENTIAL/PLATELET
Abs Immature Granulocytes: 0 10*3/uL (ref 0.00–0.07)
Basophils Absolute: 0.2 10*3/uL — ABNORMAL HIGH (ref 0.0–0.1)
Basophils Relative: 2 %
Eosinophils Absolute: 0.1 10*3/uL (ref 0.0–0.5)
Eosinophils Relative: 1 %
HCT: 26.8 % — ABNORMAL LOW (ref 36.0–46.0)
Hemoglobin: 8.1 g/dL — ABNORMAL LOW (ref 12.0–15.0)
Lymphocytes Relative: 7 %
Lymphs Abs: 0.9 10*3/uL (ref 0.7–4.0)
MCH: 35.1 pg — ABNORMAL HIGH (ref 26.0–34.0)
MCHC: 30.2 g/dL (ref 30.0–36.0)
MCV: 116 fL — ABNORMAL HIGH (ref 80.0–100.0)
Monocytes Absolute: 1.1 10*3/uL — ABNORMAL HIGH (ref 0.1–1.0)
Monocytes Relative: 9 %
Neutro Abs: 10 10*3/uL — ABNORMAL HIGH (ref 1.7–7.7)
Neutrophils Relative %: 81 %
Platelets: 198 10*3/uL (ref 150–400)
RBC: 2.31 MIL/uL — ABNORMAL LOW (ref 3.87–5.11)
RDW: 16.2 % — ABNORMAL HIGH (ref 11.5–15.5)
WBC: 12.3 10*3/uL — ABNORMAL HIGH (ref 4.0–10.5)
nRBC: 0 /100 WBC
nRBC: 0.4 % — ABNORMAL HIGH (ref 0.0–0.2)

## 2020-01-04 LAB — ABO/RH: ABO/RH(D): A POS

## 2020-01-04 LAB — PREPARE RBC (CROSSMATCH)

## 2020-01-04 LAB — PROTIME-INR
INR: 1.1 (ref 0.8–1.2)
Prothrombin Time: 14.2 seconds (ref 11.4–15.2)

## 2020-01-04 SURGERY — OPEN REDUCTION INTERNAL FIXATION (ORIF) HUMERAL SHAFT FRACTURE
Anesthesia: General | Site: Arm Upper | Laterality: Left

## 2020-01-04 MED ORDER — BUPIVACAINE HCL (PF) 0.25 % IJ SOLN
INTRAMUSCULAR | Status: DC | PRN
  Administered 2020-01-04: 15 mg

## 2020-01-04 MED ORDER — SUGAMMADEX SODIUM 200 MG/2ML IV SOLN
INTRAVENOUS | Status: DC | PRN
  Administered 2020-01-04: 200 mg via INTRAVENOUS

## 2020-01-04 MED ORDER — LACTATED RINGERS IV SOLN: INTRAVENOUS | Status: DC | PRN

## 2020-01-04 MED ORDER — ROCURONIUM 10MG/ML (10ML) SYRINGE FOR MEDFUSION PUMP - OPTIME
INTRAVENOUS | Status: DC | PRN
  Administered 2020-01-04: 30 mg via INTRAVENOUS

## 2020-01-04 MED ORDER — DEXAMETHASONE SODIUM PHOSPHATE 10 MG/ML IJ SOLN
INTRAMUSCULAR | Status: DC | PRN
  Administered 2020-01-04: 5 mg via INTRAVENOUS

## 2020-01-04 MED ORDER — SUCCINYLCHOLINE CHLORIDE 200 MG/10ML IV SOSY
PREFILLED_SYRINGE | INTRAVENOUS | Status: AC
  Filled 2020-01-04: qty 10

## 2020-01-04 MED ORDER — 0.9 % SODIUM CHLORIDE (POUR BTL) OPTIME
TOPICAL | Status: DC | PRN
  Administered 2020-01-04: 1000 mL

## 2020-01-04 MED ORDER — ONDANSETRON HCL 4 MG/2ML IJ SOLN
INTRAMUSCULAR | Status: AC
  Filled 2020-01-04: qty 2

## 2020-01-04 MED ORDER — ALBUMIN HUMAN 5 % IV SOLN: INTRAVENOUS | Status: DC | PRN

## 2020-01-04 MED ORDER — EPHEDRINE SULFATE 50 MG/ML IJ SOLN
INTRAMUSCULAR | Status: DC | PRN
  Administered 2020-01-04: 15 mg via INTRAVENOUS

## 2020-01-04 MED ORDER — SODIUM CHLORIDE 0.9 % IV SOLN: INTRAVENOUS | Status: DC | PRN

## 2020-01-04 MED ORDER — FENTANYL CITRATE (PF) 250 MCG/5ML IJ SOLN
INTRAMUSCULAR | Status: AC
  Filled 2020-01-04: qty 5

## 2020-01-04 MED ORDER — LIDOCAINE 2% (20 MG/ML) 5 ML SYRINGE
INTRAMUSCULAR | Status: AC
  Filled 2020-01-04: qty 5

## 2020-01-04 MED ORDER — BUPIVACAINE LIPOSOME 1.3 % IJ SUSP
INTRAMUSCULAR | Status: DC | PRN
  Administered 2020-01-04: 10 mg via PERINEURAL

## 2020-01-04 MED ORDER — SODIUM CHLORIDE 0.9% IV SOLUTION: Freq: Once | INTRAVENOUS | Status: DC

## 2020-01-04 MED ORDER — FENTANYL CITRATE (PF) 250 MCG/5ML IJ SOLN
INTRAMUSCULAR | Status: DC | PRN
  Administered 2020-01-04: 25 ug via INTRAVENOUS
  Administered 2020-01-04: 75 ug via INTRAVENOUS

## 2020-01-04 MED ORDER — PROPOFOL 10 MG/ML IV BOLUS
INTRAVENOUS | Status: DC | PRN
  Administered 2020-01-04: 100 ug via INTRAVENOUS

## 2020-01-04 MED ORDER — PROMETHAZINE HCL 25 MG/ML IJ SOLN: 6.2500 mg | INTRAMUSCULAR | Status: DC | PRN

## 2020-01-04 MED ORDER — PROPOFOL 10 MG/ML IV BOLUS
INTRAVENOUS | Status: AC
  Filled 2020-01-04: qty 20

## 2020-01-04 MED ORDER — POTASSIUM CHLORIDE IN NACL 20-0.9 MEQ/L-% IV SOLN
INTRAVENOUS | Status: DC
  Filled 2020-01-04: qty 1000

## 2020-01-04 MED ORDER — FENTANYL CITRATE (PF) 100 MCG/2ML IJ SOLN: 25.0000 ug | INTRAMUSCULAR | Status: DC | PRN

## 2020-01-04 MED ORDER — VANCOMYCIN HCL IN DEXTROSE 1-5 GM/200ML-% IV SOLN
1000.0000 mg | Freq: Once | INTRAVENOUS | Status: AC
  Administered 2020-01-04: 1000 mg via INTRAVENOUS
  Filled 2020-01-04: qty 200

## 2020-01-04 MED ORDER — SUCCINYLCHOLINE 20MG/ML (10ML) SYRINGE FOR MEDFUSION PUMP - OPTIME
INTRAMUSCULAR | Status: DC | PRN
  Administered 2020-01-04: 120 mg via INTRAVENOUS

## 2020-01-04 MED ORDER — DEXAMETHASONE SODIUM PHOSPHATE 10 MG/ML IJ SOLN
INTRAMUSCULAR | Status: AC
  Filled 2020-01-04: qty 1

## 2020-01-04 MED ORDER — PHENYLEPHRINE HCL-NACL 10-0.9 MG/250ML-% IV SOLN
INTRAVENOUS | Status: DC | PRN
  Administered 2020-01-04: 25 ug/min via INTRAVENOUS

## 2020-01-04 MED ORDER — ONDANSETRON HCL 4 MG/2ML IJ SOLN
INTRAMUSCULAR | Status: DC | PRN
  Administered 2020-01-04: 4 mg via INTRAVENOUS

## 2020-01-04 SURGICAL SUPPLY — 75 items
BENZOIN TINCTURE PRP APPL 2/3 (GAUZE/BANDAGES/DRESSINGS) ×6 IMPLANT
BIT DRILL 2.5X110 QC LCP DISP (BIT) ×3 IMPLANT
BIT DRILL QC LCP 2.8X165 (BIT) ×3 IMPLANT
BNDG ELASTIC 4X5.8 VLCR STR LF (GAUZE/BANDAGES/DRESSINGS) ×3 IMPLANT
BNDG ELASTIC 6X10 VLCR STRL LF (GAUZE/BANDAGES/DRESSINGS) ×3 IMPLANT
BNDG ESMARK 4X9 LF (GAUZE/BANDAGES/DRESSINGS) ×3 IMPLANT
BNDG GAUZE ELAST 4 BULKY (GAUZE/BANDAGES/DRESSINGS) ×6 IMPLANT
BRUSH SCRUB EZ PLAIN DRY (MISCELLANEOUS) ×6 IMPLANT
CANISTER WOUNDNEG PRESSURE 500 (CANNISTER) ×3 IMPLANT
CONT DENTURE (MISCELLANEOUS) ×3 IMPLANT
CORD BIPOLAR FORCEPS 12FT (ELECTRODE) ×3 IMPLANT
COVER SURGICAL LIGHT HANDLE (MISCELLANEOUS) ×6 IMPLANT
DRAPE C-ARM 42X72 X-RAY (DRAPES) ×3 IMPLANT
DRAPE ORTHO SPLIT 77X108 STRL (DRAPES) ×4
DRAPE SURG ORHT 6 SPLT 77X108 (DRAPES) ×2 IMPLANT
DRAPE U-SHAPE 47X51 STRL (DRAPES) ×6 IMPLANT
DRESSING PREVENA PLUS CUSTOM (GAUZE/BANDAGES/DRESSINGS) ×1 IMPLANT
DRSG ADAPTIC 3X8 NADH LF (GAUZE/BANDAGES/DRESSINGS) ×3 IMPLANT
DRSG PREVENA PLUS CUSTOM (GAUZE/BANDAGES/DRESSINGS) ×3
ELECT CAUTERY BLADE 6.4 (BLADE) ×3 IMPLANT
ELECT REM PT RETURN 9FT ADLT (ELECTROSURGICAL) ×3
ELECTRODE REM PT RTRN 9FT ADLT (ELECTROSURGICAL) ×1 IMPLANT
EVACUATOR 1/8 PVC DRAIN (DRAIN) IMPLANT
GAUZE SPONGE 4X4 12PLY STRL (GAUZE/BANDAGES/DRESSINGS) ×6 IMPLANT
GLOVE BIO SURGEON STRL SZ7.5 (GLOVE) ×9 IMPLANT
GLOVE BIO SURGEON STRL SZ8 (GLOVE) ×9 IMPLANT
GLOVE BIOGEL PI IND STRL 7.5 (GLOVE) ×1 IMPLANT
GLOVE BIOGEL PI IND STRL 8 (GLOVE) ×1 IMPLANT
GLOVE BIOGEL PI INDICATOR 7.5 (GLOVE) ×2
GLOVE BIOGEL PI INDICATOR 8 (GLOVE) ×2
GOWN STRL REUS W/ TWL LRG LVL3 (GOWN DISPOSABLE) ×2 IMPLANT
GOWN STRL REUS W/ TWL XL LVL3 (GOWN DISPOSABLE) ×1 IMPLANT
GOWN STRL REUS W/TWL LRG LVL3 (GOWN DISPOSABLE) ×4
GOWN STRL REUS W/TWL XL LVL3 (GOWN DISPOSABLE) ×2
KIT BASIN OR (CUSTOM PROCEDURE TRAY) ×3 IMPLANT
KIT DRSG PREVENA PLUS 7DAY 125 (MISCELLANEOUS) ×3 IMPLANT
KIT INFUSE LRG II (Orthopedic Implant) ×3 IMPLANT
KIT TURNOVER KIT B (KITS) ×3 IMPLANT
MANIFOLD NEPTUNE II (INSTRUMENTS) ×3 IMPLANT
NS IRRIG 1000ML POUR BTL (IV SOLUTION) ×3 IMPLANT
PACK ORTHO EXTREMITY (CUSTOM PROCEDURE TRAY) ×3 IMPLANT
PAD ABD 8X10 STRL (GAUZE/BANDAGES/DRESSINGS) ×3 IMPLANT
PAD ARMBOARD 7.5X6 YLW CONV (MISCELLANEOUS) ×6 IMPLANT
PAD CAST 4YDX4 CTTN HI CHSV (CAST SUPPLIES) ×1 IMPLANT
PADDING CAST COTTON 4X4 STRL (CAST SUPPLIES) ×2
PADDING CAST COTTON 6X4 STRL (CAST SUPPLIES) ×3 IMPLANT
PLATE LCP 16 HOLE 3.5 (Plate) ×3 IMPLANT
SCREW CORTEX 3.5 16MM (Screw) ×2 IMPLANT
SCREW CORTEX 3.5 26MM (Screw) ×2 IMPLANT
SCREW CORTEX 3.5 30MM (Screw) ×2 IMPLANT
SCREW LOCK CORT ST 3.5X16 (Screw) ×1 IMPLANT
SCREW LOCK CORT ST 3.5X26 (Screw) ×1 IMPLANT
SCREW LOCK CORT ST 3.5X30 (Screw) ×1 IMPLANT
SCREW LOCK T15 FT 16X3.5X2.9X (Screw) ×5 IMPLANT
SCREW LOCK T15 FT 18X3.5X2.9X (Screw) ×1 IMPLANT
SCREW LOCK T15 FT 24X3.5X2.9X (Screw) ×2 IMPLANT
SCREW LOCKING 3.5X16 (Screw) ×10 IMPLANT
SCREW LOCKING 3.5X18 (Screw) ×2 IMPLANT
SCREW LOCKING 3.5X24 (Screw) ×4 IMPLANT
SPLINT FIBERGLASS 4X30 (CAST SUPPLIES) ×3 IMPLANT
SPONGE LAP 18X18 RF (DISPOSABLE) ×12 IMPLANT
STAPLER VISISTAT 35W (STAPLE) ×3 IMPLANT
SUCTION FRAZIER HANDLE 10FR (MISCELLANEOUS) ×2
SUCTION TUBE FRAZIER 10FR DISP (MISCELLANEOUS) ×1 IMPLANT
SUT ETHILON 3 0 FSL (SUTURE) ×3 IMPLANT
SUT ETHILON 3 0 PS 1 (SUTURE) ×3 IMPLANT
SUT MNCRL AB 3-0 PS2 18 (SUTURE) ×3 IMPLANT
SUT PDS AB 0 CT 36 (SUTURE) ×3 IMPLANT
SUT PDS AB 2-0 CT1 27 (SUTURE) ×6 IMPLANT
SYR CONTROL 10ML LL (SYRINGE) ×3 IMPLANT
TOWEL GREEN STERILE (TOWEL DISPOSABLE) ×9 IMPLANT
TOWEL GREEN STERILE FF (TOWEL DISPOSABLE) ×3 IMPLANT
TUBE CONNECTING 12'X1/4 (SUCTIONS) ×1
TUBE CONNECTING 12X1/4 (SUCTIONS) ×2 IMPLANT
YANKAUER SUCT BULB TIP NO VENT (SUCTIONS) ×3 IMPLANT

## 2020-01-04 NOTE — Anesthesia Procedure Notes (Signed)
Anesthesia Regional Block: Interscalene brachial plexus block   Pre-Anesthetic Checklist: ,, timeout performed, Correct Patient, Correct Site, Correct Laterality, Correct Procedure, Correct Position, site marked, Risks and benefits discussed,  Surgical consent,  Pre-op evaluation,  At surgeon's request and post-op pain management  Laterality: Left  Prep: chloraprep       Needles:  Injection technique: Single-shot  Needle Type: Echogenic Stimulator Needle     Needle Length: 5cm  Needle Gauge: 22     Additional Needles:   Narrative:  Start time: 01/04/2020 7:19 PM End time: 01/04/2020 7:29 PM Injection made incrementally with aspirations every 5 mL.  Performed by: Personally  Anesthesiologist: Duane Boston, MD  Additional Notes: Functioning IV was confirmed and monitors applied.  A 67mm 22ga echogenic arrow stimulator was used. Sterile prep and drape,hand hygiene and sterile gloves were used.Ultrasound guidance: relevant anatomy identified, needle position confirmed, local anesthetic spread visualized around nerve(s)., vascular puncture avoided.  Image printed for medical record.  Negative aspiration and negative test dose prior to incremental administration of local anesthetic. The patient tolerated the procedure well.

## 2020-01-04 NOTE — H&P (Signed)
Orthopaedic Trauma Service (OTS) Consult   Patient ID: Shannon Joyce MRN: [id number] DOB/AGE: 06/08/1925 86 y.o.   Reason for Consult: Open left humeral shaft fracture status post ORIF 12/27/2019 Referring Physician: Davonna Belling, MD (EDP)   HPI: Shannon Joyce is an 86 y.o. white female who resides at an assisted living facility and has been nonambulatory for about 2years presents acutely to the emergency department this evening with open left humeral shaft fracture.  Patient was admitted last week for a left humerus fracture that was treated with ORIF utilizing plate osteosynthesis on 12/27/2019 by Dr. Griffin Basil.  Patient did have a somewhat eventful hospitalization including delirium and acute on chronic renal failure.  Neurology work-up for her altered mental status was negative for acute stroke but there were some chronic neurologic findings noted.  Patient did improve from a renal function with IV hydration.  She was followed by the medical service.  She does have chronic UTIs and is on suppressive antibiotic regimen including nitrofurantoin.  Patient was discharged back to assisted living facility on 01/02/2020.  The exact mechanism of how she sustained her open humerus fracture really unknown.  However she does present today with the proximal aspect of the distal fragment sticking out of her incision line.  Orthopedics was consulted regarding management.  Patient seen in the emergency department son is at bedside.  Patient seen and evaluated with Dr. Marcelino Scot.  Patient denies any additional injuries elsewhere.  She has had numerous surgeries to her right leg including femur and ankle.  Again as noted above she is essentially wheelchair-bound for about 2years but chart does note that she can do some minimal transfers.  Patient is very pleasant son believes that she is mentating at baseline  No other acute issues of note  Patient did have speech therapy evaluation during her inpatient  stay.  Modified barium swallow was completed recommendations from that were dysphagia 3 (mech soft) solid, thin liquids.  Liquid administration via cup/straw.  Medication ministration crushed with pure patient able to feed self   I did look over her medication administration sheet from the ALF and it does not appear that she is received any opioids.  Primarily receiving Tylenol and ibuprofen for pain control  Renal function today does appear to be better compared to discharge and even from the beginning of her previous admission.  Current creatinine is 1.7  Patient did have a Covid test prior to discharge to the ALF.  This test was reported on 01/01/2020 and is negative   Past Medical History:  Diagnosis Date  . Chronic kidney disease   . Hypertension   . UTI (lower urinary tract infection)     Past Surgical History:  Procedure Laterality Date  . ABDOMINAL HYSTERECTOMY    . CHOLECYSTECTOMY    . ORIF HUMERUS FRACTURE Left 12/27/2019   Procedure: OPEN REDUCTION INTERNAL FIXATION (ORIF) HUMERAL SHAFT FRACTURE;  Surgeon: Hiram Gash, MD;  Location: [facility];  Service: Orthopedics;  Laterality: Left;  . ORTHOPEDIC SURGERY      No family history on file.  Social History:  reports that she has never smoked. She has never used smokeless tobacco. She reports that she does not drink alcohol or use drugs.  Allergies:  Allergies  Allergen Reactions  . Clinoril [Sulindac] Other (See Comments)    Hives or swelling  . Darvon [Propoxyphene] Other (See Comments)    Hives or swelling  . Levaquin [Levofloxacin In D5w] Swelling  .  Meropenem Rash  . Penicillins Swelling and Other (See Comments)    Did it involve swelling of the face/tongue/throat, SOB, or low BP? Yes Did it involve sudden or severe rash/hives, skin peeling, or any reaction on the inside of your mouth or nose? Yes Did you need to seek medical attention at a hospital or doctor's office? Yes When did it last happen? If all  above answers are "NO", may proceed with cephalosporin use.  . Sulfa Antibiotics Other (See Comments)    Hives or swelling  . Cefadroxil Other (See Comments)    unknown unknown   . Gentamicin Other (See Comments)    unknown unknown     Medications: I have reviewed the patient's current medications. Current Outpatient Medications  Medication Instructions  . acetaminophen (TYLENOL) 650 mg, Oral, Every 8 hours PRN  . Ensure Plus (ENSURE PLUS) LIQD 237 mLs, Oral, 3 times daily between meals  . ibuprofen (ADVIL) 400 mg, Oral, Every 8 hours PRN  . levothyroxine (SYNTHROID) 12.5 mcg, Oral, Daily  . metoprolol succinate (TOPROL-XL) 12.5 mg, Oral, 2 times daily  . mirtazapine (REMERON) 7.5 mg, Oral, Daily at bedtime  . nitrofurantoin (MACRODANTIN) 50 mg, Oral, Daily at bedtime, Continuous course  . omeprazole (PRILOSEC) 20 mg, Oral, Daily  . oxyCODONE (OXY IR/ROXICODONE) 5 MG immediate release tablet Take 0.5-1 pill every 6 hrs as needed for pain  . senna (SENOKOT) 8.6 MG TABS tablet 1 tablet, Oral, Daily PRN     Results for orders placed or performed during the hospital encounter of 01/04/20 (from the past 48 hour(s))  CBC with Differential     Status: Abnormal   Collection Time: 01/04/20  5:22 PM  Result Value Ref Range   WBC 12.3 (H) 4.0 - 10.5 K/uL   RBC 2.31 (L) 3.87 - 5.11 MIL/uL   Hemoglobin 8.1 (L) 12.0 - 15.0 g/dL   HCT 26.8 (L) 36.0 - 46.0 %   MCV 116.0 (H) 80.0 - 100.0 fL   MCH 35.1 (H) 26.0 - 34.0 pg   MCHC 30.2 30.0 - 36.0 g/dL   RDW 16.2 (H) 11.5 - 15.5 %   Platelets 198 150 - 400 K/uL   nRBC 0.4 (H) 0.0 - 0.2 %   Neutrophils Relative % 81 %   Neutro Abs 10.0 (H) 1.7 - 7.7 K/uL   Lymphocytes Relative 7 %   Lymphs Abs 0.9 0.7 - 4.0 K/uL   Monocytes Relative 9 %   Monocytes Absolute 1.1 (H) 0.1 - 1.0 K/uL   Eosinophils Relative 1 %   Eosinophils Absolute 0.1 0.0 - 0.5 K/uL   Basophils Relative 2 %   Basophils Absolute 0.2 (H) 0.0 - 0.1 K/uL   nRBC 0 0 /100 WBC     Abs Immature Granulocytes 0.00 0.00 - 0.07 K/uL   Polychromasia PRESENT     Comment: Performed at [facility], [address], [place], [place] [id number]  Basic metabolic panel     Status: Abnormal   Collection Time: 01/04/20  5:22 PM  Result Value Ref Range   Sodium 140 135 - 145 mmol/L   Potassium 3.9 3.5 - 5.1 mmol/L   Chloride 105 98 - 111 mmol/L   CO2 25 22 - 32 mmol/L   Glucose, Bld 141 (H) 70 - 99 mg/dL   BUN 25 (H) 8 - 23 mg/dL   Creatinine, Ser 1.70 (H) 0.44 - 1.00 mg/dL   Calcium 8.1 (L) 8.9 - 10.3 mg/dL   GFR calc non  Af Amer 26 (L) >60 mL/min   GFR calc Af Amer 30 (L) >60 mL/min   Anion gap 10 5 - 15    Comment: Performed at [facility] [address], [place], [place] [address]    DG Chest Portable 1 View  Result Date: 01/04/2020 CLINICAL DATA:  Fall EXAM: PORTABLE CHEST 1 VIEW COMPARISON:  Portable exam 1719 hours compared to 12/29/2019 FINDINGS: Normal heart size, mediastinal contours, and pulmonary vascularity. Atherosclerotic calcification aorta. Decreased subsegmental atelectasis LEFT base Lungs otherwise clear. No pleural effusion or pneumothorax. Diffuse osseous demineralization with plate and screws at LEFT humeral diaphysis. IMPRESSION: Decreased subsegmental atelectasis at LEFT base. Aortic Atherosclerosis (ICD10-I70.0). Electronically Signed   By: Lavonia Dana M.D.   On: 01/04/2020 17:42   DG Humerus Left  Result Date: 01/04/2020 CLINICAL DATA:  Recent arm fracture and surgery, deformity question recurrent injury/fall EXAM: LEFT HUMERUS - 2+ VIEW COMPARISON:  01/02/2020 FINDINGS: Osseous demineralization. Plate and screws identified at LEFT humeral diaphysis with interval displacement of the distal humeral fragment from the orthopedic hardware since the prior study. Distal fragment is displaced approximately 2 cm and demonstrates a segment of cortical loss/displacement and angulation. Butterfly fragment at mid humerus unchanged. Plate remains  associated with the proximal humerus and butterfly fragment. Elbow joint alignment normal. IMPRESSION: Prior ORIF of comminuted LEFT humeral diaphyseal fracture. Interval displacement of the distal humeral fragment since the previous exam, displaced from the malleable plate and screws at the distal humerus with an additional displaced cortical fragment, main fragment distracted approximately 2 cm with mild angulation. Electronically Signed   By: Lavonia Dana M.D.   On: 01/04/2020 17:46    Review of Systems  Unable to perform ROS: Dementia   Blood pressure (!) 154/93, pulse 94, temperature 99.1 F (37.3 C), temperature source Oral, resp. rate 18, SpO2 98 %. Physical Exam Vitals and nursing note reviewed.  Constitutional:      General: She is awake.     Comments: Frail-appearing white female, pleasant  Cardiovascular:     Rate and Rhythm: Normal rate and regular rhythm.  Pulmonary:     Effort: No accessory muscle usage or respiratory distress.     Comments: Unlabored Musculoskeletal:     Comments: Left upper extremity ABD and lerlix are around the upper left arm.  This was removed.  Bone is visible along the incision line about mid aspect. No active bleeding noted. Extremity is warm Extensive swelling distally to her hand and forearm with ecchymosis as well No crepitus or gross motion noted at her forearm wrist or hand Radial, ulnar, median nerve motor and sensory functions are intact.  AIN and PIN motor functions intact + Radial pulse  Right Upper Extremity     shoulder, elbow, wrist, digits- no skin wounds, nontender, no instability, no blocks to motion  Sens  Ax/R/M/U intact  Mot   Ax/ R/ PIN/ M/ AIN/ U intact  Rad 2+  Left Lower Extremity              no open wounds or lesions, no swelling or ecchymosis   Nontender hip, knee, ankle and foot             No crepitus or gross motion noted with manipulation of the L leg  No knee or ankle effusion             No pain with axial  loading or logrolling of the hip.  No pain with manipulation of the ankle or foot             No blocks to motion noted  Sens DPN, SPN, TN intact  Motor EHL, FHL, lesser toe motor, Ext, flex, evers 5/5  DP 2+, No significant edema             Compartments are soft and nontender, no pain with passive stretching  Right Lower Extremity     Baseline chronic deformity right lower leg    Numerous surgical wounds noted to the right thigh as well as right ankle.  No acute changes are noted.  She does have some mild tenderness to the right mid lower leg.  No open wounds or bruising noted no crepitus or gross motion noted with manipulation of the right leg Motor and sensory functions appear to be grossly intact. + DP pulse Extremity is warm Hip is without pain with axial loading or logrolling Compartments are soft and nontender      Neurological:     Mental Status: She is alert. Mental status is at baseline.  Psychiatric:        Behavior: Behavior is cooperative.     Assessment/Plan:  86 year old female postop day 8 ORIF left humeral shaft s/p acute fall with open left humeral shaft fracture and hardware failure  -Open left humeral shaft fracture with retained hardware  OR for I&D left humerus and repair of left humeral shaft fracture.  Anticipate removal of current hardware placement of new hardware.  IV antibiotics for 24 hours  Admit postoperatively for continued observation and pain control  Do not anticipate the use of narcotics postoperatively given clinical course last admission.  Plan to use Tylenol alone.  We will likely use IV Tylenol for the first 48 hours and then transition back to oral Tylenol   - Pain management:  As above  - ABL anemia/Hemodynamics  Monitor H&H  May need PRBC transfusion  Order type and screen  - Medical issues   Acute on chronic renal failure   Appears to be resolving   Limit nephrotoxic medications.  We will discontinue her NSAIDs  for now   Judicious IV fluid hydration  - DVT/PE prophylaxis:  Do not anticipate any pharmacologic DVT prophylaxis as this is an upper extremity injury and the patient has been nonambulatory for 2 years  - ID:   Vancomycin ordered per pharmacy consult for open fracture.  Patient has numerous allergies   We will transition her to clindamycin as I think this would be a better option given her renal impairment to address her open fracture.  Clindamycin 900 mg IV qh8 x 24 hours   - Metabolic Bone Disease:  + vitamin d insufficiency   Supplement  - Activity:  PT/OT post op  - FEN/GI prophylaxis/Foley/Lines:  NPO   Advance diet post op  - Impediments to fracture healing:  Open fracture  Seemingly increasing fall frequency  Vitamin D insufficiency  ?  Dementia  Thyroid disease  - Dispo:  OR today for I&D left humerus, revision ORIF left humerus    Jari Pigg, PA-C [phone] (C) 01/04/2020, 6:35 PM  [facility] [place] [address] [phone] Domingo Sep (F)

## 2020-01-04 NOTE — Transfer of Care (Signed)
Immediate Anesthesia Transfer of Care Note  Patient: Shannon Joyce  Procedure(s) Performed: REMOVAL HARDWARE, OPEN REDUCTION INTERNAL FIXATION (ORIF) HUMERAL SHAFT FRACTURE (Left Arm Upper)  Patient Location: PACU  Anesthesia Type:GA combined with regional for post-op pain  Level of Consciousness: sedated  Airway & Oxygen Therapy: Patient connected to nasal cannula oxygen  Post-op Assessment: Report given to RN and Post -op Vital signs reviewed and stable  Post vital signs: Reviewed and stable  Last Vitals:  Vitals Value Taken Time  BP 140/55 01/04/20 2302  Temp 36.5 C 01/04/20 2302  Pulse 75 01/04/20 2307  Resp 14 01/04/20 2307  SpO2 97 % 01/04/20 2307  Vitals shown include unvalidated device data.  Last Pain:  Vitals:   01/04/20 1719  TempSrc: Oral  PainSc:          Complications: No apparent anesthesia complications

## 2020-01-04 NOTE — Brief Op Note (Signed)
01/04/2020  10:24 PM  PATIENT:  Shannon Joyce  86 y.o. female  PRE-OPERATIVE DIAGNOSIS:   1. OPEN LEFT HUMERAL SHAFT FRACTURE 2. LOSS OF FIXATION LEFT HUMERAL SHAFT FRACTURE AND FRACTURE EXTENSION  POST-OPERATIVE DIAGNOSIS:   1. OPEN LEFT HUMERAL SHAFT FRACTURE 2. LOSS OF FIXATION LEFT HUMERAL SHAFT FRACTURE AND FRACTURE EXTENSION  PROCEDURE:  Procedure(s): 1. REVISION OPEN REDUCTION INTERNAL FIXATION (ORIF) LEFT HUMERAL SHAFT FRACTURE (Left) AND INFUSE ALLOGRAFTING 2. DEBRIDEMENT OF OPEN FRACTURE INCLUDING BONE, MUSCLE, SKIN, AND SUBCUTANEOUS TISSUE 3. REMOVAL HARDWARE LEFT HUMERUS 4. APPLICATION OF SMALL WOUND VAC  SURGEON:  Surgeon(s) and Role:    Altamese Fingal, MD - Primary  PHYSICIAN ASSISTANT: Ainsley Spinner, PA-C  ANESTHESIA:   general  I/O:  Total I/O In: 819 [Blood:319; IV Piggyback:500] Out: 150 [Blood:150]  SPECIMEN:  No Specimen  TOURNIQUET:  * No tourniquets in log *  COMPLICATIONS: NONE  DICTATION: .Other Dictation: Dictation Number [id number]  DISPOSITION: TO PACU  CONDITION: STABLE  DELAY START OF DVT PROPHYLAXIS BECAUSE OF BLEEDING RISK: NO

## 2020-01-04 NOTE — ED Provider Notes (Signed)
[facility] EMERGENCY DEPARTMENT Provider Note   CSN: [id number] Arrival date & time: 01/04/20  1705     History Chief Complaint  Patient presents with  . Post-op Problem    Shannon Joyce is a 86 y.o. female.  HPI Level 5 caveat due to dementia. Patient presents from memory care unit.  Had recent ORIF of left humeral fracture by Dr. Griffin Basil 8 days ago.  Now has tenting of the wound with visible bone.  No reported fall.  Patient states it hurts but really cannot provide much history.  Unsure of last oral intake.  Last Covid test was 3 days ago and was negative.    Past Medical History:  Diagnosis Date  . Chronic kidney disease   . Hypertension   . UTI (lower urinary tract infection)     Patient Active Problem List   Diagnosis Date Noted  . AKI (acute kidney injury) ([facility])   . Displaced fracture of shaft of left humerus 12/28/2019  . Fracture of humeral shaft, left, closed 12/27/2019    Past Surgical History:  Procedure Laterality Date  . ABDOMINAL HYSTERECTOMY    . CHOLECYSTECTOMY    . ORIF HUMERUS FRACTURE Left 12/27/2019   Procedure: OPEN REDUCTION INTERNAL FIXATION (ORIF) HUMERAL SHAFT FRACTURE;  Surgeon: Hiram Gash, MD;  Location: [facility];  Service: Orthopedics;  Laterality: Left;  . ORTHOPEDIC SURGERY       OB History   No obstetric history on file.     No family history on file.  Social History   Tobacco Use  . Smoking status: Never Smoker  . Smokeless tobacco: Never Used  Substance Use Topics  . Alcohol use: No  . Drug use: No    Home Medications Prior to Admission medications   Medication Sig Start Date End Date Taking? Authorizing Provider  acetaminophen (TYLENOL) 650 MG CR tablet Take 650 mg by mouth every 8 (eight) hours as needed for pain.    [provider]  Ensure Plus (ENSURE PLUS) LIQD Take 237 mLs by mouth 3 (three) times daily between meals.    [provider]  ibuprofen (ADVIL,MOTRIN) 200 MG tablet  Take 400 mg by mouth every 8 (eight) hours as needed for moderate pain.     [provider]  levothyroxine (SYNTHROID) 25 MCG tablet Take 12.5 mcg by mouth daily. 12/16/19   [provider]  metoprolol succinate (TOPROL-XL) 25 MG 24 hr tablet Take 0.5 tablets (12.5 mg total) by mouth 2 (two) times daily. 01/02/20   McBane, Maylene Roes, PA-C  mirtazapine (REMERON) 7.5 MG tablet Take 7.5 mg by mouth at bedtime. 12/25/19   [provider]  nitrofurantoin (MACRODANTIN) 50 MG capsule Take 50 mg by mouth at bedtime. Continuous course 12/01/14   [provider]  omeprazole (PRILOSEC) 20 MG capsule Take 20 mg by mouth daily. 12/18/19   [provider]  oxyCODONE (OXY IR/ROXICODONE) 5 MG immediate release tablet Take 0.5-1 pill every 6 hrs as needed for pain 01/01/20   McBane, Maylene Roes, PA-C  senna (SENOKOT) 8.6 MG TABS tablet Take 1 tablet by mouth daily as needed for mild constipation.    [provider]    Allergies    Clinoril [sulindac], Darvon [propoxyphene], Levaquin [levofloxacin in d5w], Meropenem, Penicillins, Sulfa antibiotics, Cefadroxil, and Gentamicin  Review of Systems   Review of Systems  Unable to perform ROS: Dementia    Physical Exam Updated Vital Signs BP (!) 154/93   Pulse 94  Temp 99.1 F (37.3 C) (Oral)   Resp 18   SpO2 98%   Physical Exam Vitals and nursing note reviewed.  HENT:     Head: Normocephalic and atraumatic.  Eyes:     General: No scleral icterus. Cardiovascular:     Rate and Rhythm: Regular rhythm.  Pulmonary:     Breath sounds: No wheezing or rhonchi.  Abdominal:     Tenderness: There is no abdominal tenderness.  Musculoskeletal:     Comments: Edema of left upper extremity.  Wound from recent ORIF of humerus is tented with some diastases without tearing of the sutures.  Bone is visible tenting at the skin and through the diastases of the wound.  Skin:    General: Skin is warm.     Capillary Refill:  Capillary refill takes less than 2 seconds.  Neurological:     Mental Status: She is alert. Mental status is at baseline.     ED Results / Procedures / Treatments   Labs (all labs ordered are listed, but only abnormal results are displayed) Labs Reviewed  CBC WITH DIFFERENTIAL/PLATELET - Abnormal; Notable for the following components:      Result Value   WBC 12.3 (*)    RBC 2.31 (*)    Hemoglobin 8.1 (*)    HCT 26.8 (*)    MCV 116.0 (*)    MCH 35.1 (*)    RDW 16.2 (*)    nRBC 0.4 (*)    Neutro Abs 10.0 (*)    Monocytes Absolute 1.1 (*)    Basophils Absolute 0.2 (*)    All other components within normal limits  BASIC METABOLIC PANEL - Abnormal; Notable for the following components:   Glucose, Bld 141 (*)    BUN 25 (*)    Creatinine, Ser 1.70 (*)    Calcium 8.1 (*)    GFR calc non Af Amer 26 (*)    GFR calc Af Amer 30 (*)    All other components within normal limits  URINALYSIS, ROUTINE W REFLEX MICROSCOPIC  PROTIME-INR  TYPE AND SCREEN    EKG None  Radiology DG Chest Portable 1 View  Result Date: 01/04/2020 CLINICAL DATA:  Fall EXAM: PORTABLE CHEST 1 VIEW COMPARISON:  Portable exam 1719 hours compared to 12/29/2019 FINDINGS: Normal heart size, mediastinal contours, and pulmonary vascularity. Atherosclerotic calcification aorta. Decreased subsegmental atelectasis LEFT base Lungs otherwise clear. No pleural effusion or pneumothorax. Diffuse osseous demineralization with plate and screws at LEFT humeral diaphysis. IMPRESSION: Decreased subsegmental atelectasis at LEFT base. Aortic Atherosclerosis (ICD10-I70.0). Electronically Signed   By: Lavonia Dana M.D.   On: 01/04/2020 17:42   DG Humerus Left  Result Date: 01/04/2020 CLINICAL DATA:  Recent arm fracture and surgery, deformity question recurrent injury/fall EXAM: LEFT HUMERUS - 2+ VIEW COMPARISON:  01/02/2020 FINDINGS: Osseous demineralization. Plate and screws identified at LEFT humeral diaphysis with interval  displacement of the distal humeral fragment from the orthopedic hardware since the prior study. Distal fragment is displaced approximately 2 cm and demonstrates a segment of cortical loss/displacement and angulation. Butterfly fragment at mid humerus unchanged. Plate remains associated with the proximal humerus and butterfly fragment. Elbow joint alignment normal. IMPRESSION: Prior ORIF of comminuted LEFT humeral diaphyseal fracture. Interval displacement of the distal humeral fragment since the previous exam, displaced from the malleable plate and screws at the distal humerus with an additional displaced cortical fragment, main fragment distracted approximately 2 cm with mild angulation. Electronically Signed   By: Crist Infante.D.  On: 01/04/2020 17:46    Procedures Procedures (including critical care time)  Medications Ordered in ED Medications  vancomycin (VANCOCIN) IVPB 1000 mg/200 mL premix (1,000 mg Intravenous New Bag/Given 01/04/20 1750)    ED Course  I have reviewed the triage vital signs and the nursing notes.  Pertinent labs & imaging results that were available during my care of the patient were reviewed by me and considered in my medical decision making (see chart for details).    MDM Rules/Calculators/A&P                      Patient with refracturing of recent humerus repair.  Tenting skin with visible bone.  Discussed with Dr. Marcelino Scot, who will take to the OR. Final Clinical Impression(s) / ED Diagnoses Final diagnoses:  Open fracture of shaft of left humerus, unspecified fracture morphology, initial encounter    Rx / DC Orders ED Discharge Orders    None       Davonna Belling, MD 01/04/20 1850

## 2020-01-04 NOTE — Anesthesia Preprocedure Evaluation (Addendum)
Anesthesia Evaluation  Patient identified by MRN, date of birth, ID band Patient awake    Reviewed: Allergy & Precautions, H&P , NPO status , Patient's Chart, lab work & pertinent test results  History of Anesthesia Complications Negative for: history of anesthetic complications  Airway Mallampati: II  TM Distance: >3 FB Neck ROM: Full    Dental  (+) Edentulous Upper, Edentulous Lower   Pulmonary neg pulmonary ROS,    Pulmonary exam normal        Cardiovascular hypertension, Normal cardiovascular exam     Neuro/Psych negative neurological ROS     GI/Hepatic negative GI ROS, Neg liver ROS,   Endo/Other  negative endocrine ROS  Renal/GU Renal InsufficiencyRenal disease     Musculoskeletal   Abdominal   Peds  Hematology  (+) anemia ,   Anesthesia Other Findings   Reproductive/Obstetrics                            Anesthesia Physical  Anesthesia Plan  ASA: III  Anesthesia Plan: General   Post-op Pain Management:  Regional for Post-op pain   Induction: Intravenous  PONV Risk Score and Plan: 3 and Ondansetron, Dexamethasone and Treatment may vary due to age or medical condition  Airway Management Planned: Oral ETT  Additional Equipment:   Intra-op Plan:   Post-operative Plan: Extubation in OR  Informed Consent: I have reviewed the patients History and Physical, chart, labs and discussed the procedure including the risks, benefits and alternatives for the proposed anesthesia with the patient or authorized representative who has indicated his/her understanding and acceptance.     Dental advisory given and Consent reviewed with POA  Plan Discussed with: Anesthesiologist and CRNA  Anesthesia Plan Comments: (Discussed with son: Jalynne Kaye)      Anesthesia Quick Evaluation

## 2020-01-04 NOTE — ED Triage Notes (Signed)
Came in via ems; reported memory care noted surgical site bleeding after patient complain of pain; and something protruding on site. Denies fall incident since the original incident when patient had ORIF 12/27/2019. EMS medicated w/ 100 mcg fentanyl

## 2020-01-04 NOTE — Anesthesia Procedure Notes (Signed)
Procedure Name: Intubation Date/Time: 01/04/2020 8:08 PM Performed by: Valetta Fuller, CRNA Pre-anesthesia Checklist: Patient identified, Emergency Drugs available, Suction available and Patient being monitored Patient Re-evaluated:Patient Re-evaluated prior to induction Oxygen Delivery Method: Circle system utilized Preoxygenation: Pre-oxygenation with 100% oxygen Induction Type: IV induction and Rapid sequence Laryngoscope Size: Miller and 2 Grade View: Grade I Tube type: Oral Tube size: 7.5 mm Number of attempts: 1 Airway Equipment and Method: Stylet Placement Confirmation: ETT inserted through vocal cords under direct vision,  positive ETCO2 and breath sounds checked- equal and bilateral Secured at: 21 cm Tube secured with: Tape Dental Injury: Teeth and Oropharynx as per pre-operative assessment

## 2020-01-05 DIAGNOSIS — S42342B Displaced spiral fracture of shaft of humerus, left arm, initial encounter for open fracture: Secondary | ICD-10-CM | POA: Diagnosis present

## 2020-01-05 LAB — RENAL FUNCTION PANEL
Albumin: 2.8 g/dL — ABNORMAL LOW (ref 3.5–5.0)
Anion gap: 12 (ref 5–15)
BUN: 21 mg/dL (ref 8–23)
CO2: 20 mmol/L — ABNORMAL LOW (ref 22–32)
Calcium: 7 mg/dL — ABNORMAL LOW (ref 8.9–10.3)
Chloride: 105 mmol/L (ref 98–111)
Creatinine, Ser: 1.48 mg/dL — ABNORMAL HIGH (ref 0.44–1.00)
GFR calc Af Amer: 35 mL/min — ABNORMAL LOW (ref >60)
GFR calc non Af Amer: 30 mL/min — ABNORMAL LOW (ref >60)
Glucose, Bld: 325 mg/dL — ABNORMAL HIGH (ref 70–99)
Phosphorus: 3.4 mg/dL (ref 2.5–4.6)
Potassium: 4 mmol/L (ref 3.5–5.1)
Sodium: 137 mmol/L (ref 135–145)

## 2020-01-05 LAB — CBC
HCT: 34.4 % — ABNORMAL LOW (ref 36.0–46.0)
Hemoglobin: 10.8 g/dL — ABNORMAL LOW (ref 12.0–15.0)
MCH: 32.7 pg (ref 26.0–34.0)
MCHC: 31.4 g/dL (ref 30.0–36.0)
MCV: 104.2 fL — ABNORMAL HIGH (ref 80.0–100.0)
Platelets: 128 10*3/uL — ABNORMAL LOW (ref 150–400)
RBC: 3.3 MIL/uL — ABNORMAL LOW (ref 3.87–5.11)
RDW: 21.1 % — ABNORMAL HIGH (ref 11.5–15.5)
WBC: 11.4 10*3/uL — ABNORMAL HIGH (ref 4.0–10.5)
nRBC: 0.2 % (ref 0.0–0.2)

## 2020-01-05 LAB — URINALYSIS, ROUTINE W REFLEX MICROSCOPIC
Bilirubin Urine: NEGATIVE
Glucose, UA: NEGATIVE mg/dL
Ketones, ur: 5 mg/dL — AB
Nitrite: NEGATIVE
Protein, ur: 30 mg/dL — AB
Specific Gravity, Urine: 1.013 (ref 1.005–1.030)
WBC, UA: >50 WBC/hpf — ABNORMAL HIGH (ref 0–5)
pH: 6 (ref 5.0–8.0)

## 2020-01-05 LAB — TYPE AND SCREEN
ABO/RH(D): A POS
Antibody Screen: NEGATIVE
Unit division: 0
Unit division: 0

## 2020-01-05 LAB — BPAM RBC
Blood Product Expiration Date: 202101302359
Blood Product Expiration Date: 202102142359
ISSUE DATE / TIME: 202101232000
ISSUE DATE / TIME: 202101232213
Unit Type and Rh: 6200
Unit Type and Rh: 6200

## 2020-01-05 LAB — POCT I-STAT, CHEM 8
BUN: 22 mg/dL (ref 8–23)
Calcium, Ion: 1.12 mmol/L — ABNORMAL LOW (ref 1.15–1.40)
Chloride: 104 mmol/L (ref 98–111)
Creatinine, Ser: 1.4 mg/dL — ABNORMAL HIGH (ref 0.44–1.00)
Glucose, Bld: 116 mg/dL — ABNORMAL HIGH (ref 70–99)
HCT: 20 % — ABNORMAL LOW (ref 36.0–46.0)
Hemoglobin: 6.8 g/dL — CL (ref 12.0–15.0)
Potassium: 3 mmol/L — ABNORMAL LOW (ref 3.5–5.1)
Sodium: 141 mmol/L (ref 135–145)
TCO2: 25 mmol/L (ref 22–32)

## 2020-01-05 MED ORDER — ENSURE ENLIVE PO LIQD
237.0000 mL | Freq: Three times a day (TID) | ORAL | Status: DC
  Administered 2020-01-05 – 2020-01-08 (×9): 237 mL via ORAL

## 2020-01-05 MED ORDER — ONDANSETRON HCL 4 MG/2ML IJ SOLN: 4.0000 mg | Freq: Four times a day (QID) | INTRAMUSCULAR | Status: DC | PRN

## 2020-01-05 MED ORDER — ACETAMINOPHEN 160 MG/5ML PO SOLN
1000.0000 mg | Freq: Once | ORAL | Status: AC
  Administered 2020-01-05: 1000 mg via ORAL
  Filled 2020-01-05: qty 40.6

## 2020-01-05 MED ORDER — ONDANSETRON HCL 4 MG PO TABS: 4.0000 mg | ORAL_TABLET | Freq: Four times a day (QID) | ORAL | Status: DC | PRN

## 2020-01-05 MED ORDER — ACETAMINOPHEN 10 MG/ML IV SOLN
INTRAVENOUS | Status: AC
  Administered 2020-01-05: 1000 mg via INTRAVENOUS
  Filled 2020-01-05: qty 100

## 2020-01-05 MED ORDER — DOCUSATE SODIUM 50 MG/5ML PO LIQD
100.0000 mg | Freq: Two times a day (BID) | ORAL | Status: DC
  Administered 2020-01-05 – 2020-01-08 (×7): 100 mg via ORAL
  Filled 2020-01-05 (×7): qty 10

## 2020-01-05 MED ORDER — CLINDAMYCIN PHOSPHATE 600 MG/50ML IV SOLN
600.0000 mg | Freq: Three times a day (TID) | INTRAVENOUS | Status: AC
  Administered 2020-01-05 (×2): 600 mg via INTRAVENOUS
  Filled 2020-01-05 (×2): qty 50

## 2020-01-05 MED ORDER — DOCUSATE SODIUM 100 MG PO CAPS
100.0000 mg | ORAL_CAPSULE | Freq: Two times a day (BID) | ORAL | Status: DC
  Filled 2020-01-05: qty 1

## 2020-01-05 MED ORDER — METOCLOPRAMIDE HCL 5 MG PO TABS: 5.0000 mg | ORAL_TABLET | Freq: Three times a day (TID) | ORAL | Status: DC | PRN

## 2020-01-05 MED ORDER — METOCLOPRAMIDE HCL 5 MG/ML IJ SOLN: 5.0000 mg | Freq: Three times a day (TID) | INTRAMUSCULAR | Status: DC | PRN

## 2020-01-05 MED ORDER — NITROFURANTOIN MACROCRYSTAL 50 MG PO CAPS
50.0000 mg | ORAL_CAPSULE | Freq: Every day | ORAL | Status: DC
  Filled 2020-01-05 (×2): qty 1

## 2020-01-05 MED ORDER — ASCORBIC ACID 500 MG PO TABS
500.0000 mg | ORAL_TABLET | Freq: Every day | ORAL | Status: DC
  Administered 2020-01-05 – 2020-01-08 (×4): 500 mg via ORAL
  Filled 2020-01-05 (×4): qty 1

## 2020-01-05 MED ORDER — LORAZEPAM 0.5 MG PO TABS
0.5000 mg | ORAL_TABLET | Freq: Once | ORAL | Status: AC
  Administered 2020-01-05: 0.5 mg via ORAL
  Filled 2020-01-05: qty 1

## 2020-01-05 MED ORDER — ACETAMINOPHEN 10 MG/ML IV SOLN
1000.0000 mg | Freq: Four times a day (QID) | INTRAVENOUS | Status: DC
  Administered 2020-01-05 (×2): 1000 mg via INTRAVENOUS
  Filled 2020-01-05 (×2): qty 100

## 2020-01-05 NOTE — Plan of Care (Signed)

## 2020-01-05 NOTE — Op Note (Signed)
Shannon Joyce, BETHEL MEDICAL RECORD [id number] ACCOUNT [id number] DATE OF BIRTH:[birth year] FACILITY: [facility] LOCATION: [facility] PHYSICIAN:Henryk Ursin H. Dariann Huckaba, MD  OPERATIVE REPORT  DATE OF PROCEDURE:  01/04/2020  PREOPERATIVE DIAGNOSES: 1.  Open left humeral shaft fracture. 2.  Loss of fixation, left humeral shaft fracture with fracture extension in the supracondylar region.  POSTOPERATIVE DIAGNOSES: 1.  Open left humeral shaft fracture. 2.  Loss of fixation, left humeral shaft fracture with fracture extension in the supracondylar region.  PROCEDURES:   1.  Revision open reduction internal fixation of left humeral shaft fracture. 2.  Removal of hardware, left humerus.  SURGEON:  Altamese Edgemoor, MD  ASSISTANT:  Ainsley Spinner, PA-C.  ANESTHESIA:  General.  COMPLICATIONS:  None.  TOURNIQUET:  None.  INS:  One unit PRBCs, 500 mL of crystalloid.  ESTIMATED BLOOD LOSS:  150 mL.  SPECIMENS:  None.  TOURNIQUET:  None.    COMPLICATIONS:  None.  PATIENT DISPOSITION:  To PACU.  CONDITION:  Stable.  BRIEF SUMMARY FOR PROCEDURE:  The patient is a pleasant 86 year old female who underwent ORIF of a humeral shaft fracture about 10 days ago by Dr. Ophelia Charter with excellent postoperative alignment.  The patient's postoperative course was complicated by  delirium and acute renal insufficiency.  The patient ultimately was discharged 2 days ago and has not yet recovered full neurologic awareness as assessed today at the bedside with her son.  She has continued to improve.  She is unclear as to what  happened exactly, but the patient was reportedly found down with deformity and pain in the left arm, as well as bleeding with visibility of the bone coming through the surgical wound.  I discussed with the patient's son who is at the bedside, as well as  the patient herself, the risks and benefits of revision surgery and debridement, including the possibility of failure to prevent infection, nerve  injury, vessel injury, DVT, PE, loss of motion and need for further surgery, among others.  We also  discussed the possibility of heart attack, stroke given her advanced age and comorbidities.  After discussion of these risks, consent was provided as the decision was made to continue.  BRIEF SUMMARY OF PROCEDURE:  The patient received preoperative antibiotics, taken to the operating room where general anesthesia was induced.  The left upper extremity was carefully prepped and draped.  The distal spike of bone was caught on the lateral  edge of the wound proximally at its midportion.  This had contused and damaged the subcutaneous tissue and skin on the lateral side of the wound in the midportion as well.  The sutures were removed and then the area irrigated thoroughly, after first  taking a rongeur to remove the exposed spike of bone and then curettes on both proximal and distal segments to remove the hematoma.  I did have to extend the incision quite a bit distally to gain adequate exposure for the hardware removal and full  cleaning of the fracture hematoma.  I also removed the lag screws.  There was no remaining fixation, except for the anterior to posterior screws in the proximal fragment.  Once these were removed, additional curettage was performed.  This was  supplemented with chlorhexidine soap and wash and an entire 6000 mL of saline were used.  Fresh drapes and gloves were then donned.  We began with a full evaluation of the fracture, which showed extensive comminution through the mid shaft in a spiral  pattern, as well as now extending in  the supracondylar region, all the way down the medial column of the bone to the olecranon fossa.  Fortunately, much of the lateral column was still preserved and intact, but there was no way to obtain bicortical  fixation.  I used a scalpel to excise the contused lateral edge of the wound going down to subcutaneous tissue.  In the midportion of the wound,  I had  removed some muscle and again the spike of bone was removed with the rongeur.  I took the 18-hole 3.5  LC-DCP plate from [employer] and created a valgus bend and applied this to the anterolateral side for best biomechanical effect.  We pinned this provisionally and checked C-arm.  The massive amount of proximal humerus deformity prevented [place] from extending  the plate any further proximally.  Distally, we had it all the way down to the capitellum.  In this manner, we were able to pin it provisionally, confirm plate placement, reduction and then secure 6 unicortical screws distally and 4 bicortical screws  proximally.  All were locked distally, though I initially placed standard screws to maximally oppose the plate to the bone, followed by exchanging these for locked screws.  A similar technique was used proximally with standard screws, leaving the  ultimate screw proximally and exchanging the others for locked in sequential fashion.  Lastly, large Infuse sponge Allograft was placed into the fracture gap anteriorly which was created by the spiral fracture of the debridement of the bone, combined  with the valgus bend on the plate.  It was so comminuted, this was a bridge construct as well and half of the sponge was placed within this fracture gap and the other half was layered over bridging this area of comminution.  We were able to reapproximate  the brachialis at the distal part of the wound with 0 PDS and then 2-0 PDS for the skin and  nylon interrupted sutures.  Lastly, because of the open wound, the considerable edema, a Prevena wound VAC sponge was applied and then a posterior and a stirrup  long-arm splint.  The patient was taken to the PACU in stable condition.  Ainsley Spinner, PA-C, was present and assisting throughout this very technically demanding and difficult case.  PROGNOSIS:  The patient was placed in a splint given her delirium at the end of her previous procedure.  Once, she has stabilized mentally  and we feel confident in her ability to follow instructions and supervised therapy, we will remove the splint and  initiate immediate range of motion.  She will be restricted from any active abduction.  Her risk of complication is elevated.  She is nonambulatory and has been for over 2 years and is dependent upon her upper extremities for mobilization and around the  bed.  Furthermore, of course, her open fracture increases her chances of nonunion, infection and need for further surgery.  VN/NUANCE  D:01/04/2020 T:01/05/2020 JOB:[id number]

## 2020-01-05 NOTE — Plan of Care (Signed)
  Problem: Health Behavior/Discharge Planning: Goal: Ability to manage health-related needs will improve Outcome: Progressing   Problem: Activity: Goal: Risk for activity intolerance will decrease Outcome: Progressing   

## 2020-01-05 NOTE — Progress Notes (Signed)
Pt arrived to the unit from PACU. 2nd unit of blood ongoing. 2L oxygen via nasal cannula. Lt arm with ace splint wrap and wound vac continuously with zero output. Rt leg has +3 edema, elevated. Pt resting comfortably as of now.

## 2020-01-05 NOTE — Progress Notes (Signed)
[facility] ([facility])  1 Day Post-Op Procedure(s) (LRB): REMOVAL HARDWARE, OPEN REDUCTION INTERNAL FIXATION (ORIF) HUMERAL SHAFT FRACTURE (Left)  Subjective: Patient reports pain as minimal; block still in effect.    Objective: Current Vitals Blood pressure 117/73, pulse 79, temperature 97.6 F (36.4 C), resp. rate 16, SpO2 91 %. Vital signs in last 24 hours: Temp:  [97.2 F (36.2 C)-99.1 F (37.3 C)] 97.6 F (36.4 C) (01/24 0829) Pulse Rate:  [71-94] 79 (01/24 0829) Resp:  [12-24] 16 (01/24 0829) BP: (108-161)/(55-93) 117/73 (01/24 0829) SpO2:  [91 %-100 %] 91 % (01/24 0829)  Intake/Output from previous day: 01/23 0701 - 01/24 0700 In: 2384 [I.V.:1000; Blood:634; IV [id number] Out: 450 [Urine:250; Blood:200]  LABS Recent Labs    01/04/20 1722 01/05/20 0613  HGB 8.1* 10.8*   Recent Labs    01/04/20 1722 01/05/20 0613  WBC 12.3* 11.4*  RBC 2.31* 3.30*  HCT 26.8* 34.4*  PLT 198 128*   Recent Labs    01/04/20 1722 01/05/20 0613  NA 140 137  K 3.9 4.0  CL 105 105  CO2 25 20*  BUN 25* 21  CREATININE 1.70* 1.48*  GLUCOSE 141* 325*  CALCIUM 8.1* 7.0*   Recent Labs    01/04/20 1845  INR 1.1     Physical Exam LUE  Sling and splint in place  Sens  Ax/R/M/U absent  Mot   Ax/ R/ PIN/ M/ AIN/ U absent  Brisk CR, swelling slightly improved  Assessment/Plan: 1 Day Post-Op Procedure(s) (LRB): REMOVAL HARDWARE, OPEN REDUCTION INTERNAL FIXATION (ORIF) HUMERAL SHAFT FRACTURE (Left) 1. PT/OT  For edema control; digital motion; transfer teaching with strict NWB on LUE 2. DVT proph Not applicable 3. D/c planning with F/u 8-14 days  Altamese [place], MD [facility], [facility] [phone]

## 2020-01-05 NOTE — Anesthesia Postprocedure Evaluation (Signed)
Anesthesia Post Note  Patient: Shannon Joyce  Procedure(s) Performed: REMOVAL HARDWARE, OPEN REDUCTION INTERNAL FIXATION (ORIF) HUMERAL SHAFT FRACTURE (Left Arm Upper)     Patient location during evaluation: PACU Anesthesia Type: General Level of consciousness: sedated Pain management: pain level controlled Vital Signs Assessment: post-procedure vital signs reviewed and stable Respiratory status: spontaneous breathing and respiratory function stable Cardiovascular status: stable Postop Assessment: no apparent nausea or vomiting Anesthetic complications: no    Last Vitals:  Vitals:   01/05/20 0000 01/05/20 0005  BP: (!) 141/69 (!) 147/65  Pulse: 74 72  Resp: 12 13  Temp:    SpO2: 100% 100%    Last Pain:  Vitals:   01/04/20 2348  TempSrc: Oral  PainSc:                  [name]

## 2020-01-05 NOTE — Plan of Care (Signed)
Pt. Will need 24/7 assist for ADLs and mobility. Pt. Is needed extensive assist currently. Pt. Is not able to wiggle digits of L hand and needs PROM. Acute OT to follow for ROM of L UE and ADLs.

## 2020-01-05 NOTE — Evaluation (Signed)
Occupational Therapy Evaluation Patient Details Name: Shannon Joyce MRN: [id number] DOB: 1925-08-07 Today's Date: 01/05/2020    History of Present Illness Patient is a 86 y/o female presenting to the ED on 01/04/20 with open left humeral shaft fracture. Of note, patient admitted last week with left humerus fracture that was treated with ORIF on 12/27/19. Admitted currently for s/p acute fall with open left humeral shaft fracture and hardware failure. REVISION OPEN REDUCTION INTERNAL FIXATION (ORIF) LEFT HUMERAL SHAFT FRACTURE (Left) AND INFUSE ALLOGRAFTING on 01/04/20.  PMH HTN.   Clinical Impression   Pt. Will need 24/7 assist for ADLs and mobility. Pt. Is needed extensive assist currently. Pt. Is not able to wiggle digits of L hand and needs PROM. Acute OT to follow for ROM of L UE and ADLs.     Follow Up Recommendations  SNF(unsure if pt. came from memory care unit)    Equipment Recommendations       Recommendations for Other Services       Precautions / Restrictions Precautions Precautions: Fall Precaution Comments: sling for comfort Required Braces or Orthoses: Sling Restrictions Weight Bearing Restrictions: Yes LUE Weight Bearing: Non weight bearing      Mobility Bed Mobility               General bed mobility comments: 2 person extensive assist  Transfers                 General transfer comment: 2 person assist to scoot up to head of bed.     Balance                                           ADL either performed or assessed with clinical judgement   ADL Overall ADL's : Needs assistance/impaired Eating/Feeding: Set up   Grooming: Wash/dry hands;Wash/dry face;Moderate assistance   Upper Body Bathing: Sitting;Maximal assistance   Lower Body Bathing: Maximal assistance;Sit to/from stand;+2 for physical assistance;+2 for safety/equipment   Upper Body Dressing : Maximal assistance;Sitting   Lower Body Dressing: Total assistance;+2  for physical assistance;+2 for safety/equipment;Sit to/from stand                 General ADL Comments: Pt. is requiring extensive assist for ADLs and mobility.      Vision Baseline Vision/History: Wears glasses       Perception     Praxis      Pertinent Vitals/Pain Pain Assessment: Faces Faces Pain Scale: Hurts a little bit Pain Location: movements with LUE at elbow Pain Descriptors / Indicators: Grimacing Pain Intervention(s): Monitored during session     Hand Dominance Right   Extremity/Trunk Assessment Upper Extremity Assessment Upper Extremity Assessment: (Pt. aarom for SHLD to 90. casted hand to  elbow. Unable to w) LUE Coordination: (unable to move digits of L hand)           Communication Communication Communication: HOH   Cognition Arousal/Alertness: Awake/alert Behavior During Therapy: Flat affect Overall Cognitive Status: No family/caregiver present to determine baseline cognitive functioning                                     General Comments       Exercises Other Exercises Other Exercises: AAROM to L SHLD and PROM to digits of L hand   Shoulder  Instructions      Home Living Family/patient expects to be discharged to:: Assisted living(memory care unit)     Type of Home: Assisted living Home Access: Level entry     Home Layout: One level               Home Equipment: Wheelchair - manual          Prior Functioning/Environment Level of Independence: Needs assistance        Comments: Per son pt was able to get herself in and OOB, get from bed to W/C by herself, get from W/C to toilet by herself, self propel her W/C, and do most of her ADLs until this past month when she has fallen more        OT Problem List:        OT Treatment/Interventions: Self-care/ADL training;Patient/family education;Balance training;Therapeutic activities    OT Goals(Current goals can be found in the care plan section) Acute  Rehab OT Goals Patient Stated Goal: none stated OT Goal Formulation: Patient unable to participate in goal setting Time For Goal Achievement: 01/19/20 Potential to Achieve Goals: Fair  OT Frequency: Min 3X/week   Barriers to D/C: (memory care unit vs. SNF)          Co-evaluation PT/OT/SLP Co-Evaluation/Treatment: Yes Reason for Co-Treatment: Complexity of the patient's impairments (multi-system involvement);Necessary to address cognition/behavior during functional activity;For patient/therapist safety;To address functional/ADL transfers PT goals addressed during session: Mobility/safety with mobility OT goals addressed during session: ADL's and self-care      AM-PAC OT "6 Clicks" Daily Activity     Outcome Measure Help from another person eating meals?: A Little Help from another person taking care of personal grooming?: A Lot Help from another person toileting, which includes using toliet, bedpan, or urinal?: Total Help from another person bathing (including washing, rinsing, drying)?: Total Help from another person to put on and taking off regular upper body clothing?: A Lot Help from another person to put on and taking off regular lower body clothing?: Total 6 Click Score: 10   End of Session Equipment Utilized During Treatment: Gait belt Nurse Communication: Mobility status  Activity Tolerance: Patient tolerated treatment well Patient left: in bed;with call bell/phone within reach;with bed alarm set  OT Visit Diagnosis: Unsteadiness on feet (R26.81);Other abnormalities of gait and mobility (R26.89);Repeated falls (R29.6);History of falling (Z91.81);Pain;Other symptoms and signs involving cognitive function                Time: [id number] OT Time Calculation (min): 21 min Charges:  OT General Charges $OT Visit: 1 Visit OT Evaluation $OT Eval Moderate Complexity: 1 Mod  6 clicks  Shruthi Northrup 01/05/2020, 11:55 AM

## 2020-01-05 NOTE — Progress Notes (Signed)
Very confused, continuous crying with very disorganized thinking.  Pulling on wound vac, hand mittens placed, patient biting at them to get off. Multiple nurses in room to help calm patient down without success.  On call PA called with order to give ativan 0.5mg  po x1.  Closely monitoring.

## 2020-01-05 NOTE — Evaluation (Signed)
Physical Therapy Evaluation Patient Details Name: Shannon Joyce MRN: [id number] DOB: 06-13-1925 Today's Date: 01/05/2020   History of Present Illness  Patient is a 86 y/o female presenting to the ED on 01/04/20 with open left humeral shaft fracture. Of note, patient admitted last week with left humerus fracture that was treated with ORIF on 12/27/19. Admitted currently for s/p acute fall with open left humeral shaft fracture and hardware failure. REVISION OPEN REDUCTION INTERNAL FIXATION (ORIF) LEFT HUMERAL SHAFT FRACTURE (Left) AND INFUSE ALLOGRAFTING on 01/04/20.  PMH HTN.    Clinical Impression  Patient admitted with the above. Per chart review patient was a resident of ALF where she was grossly w/c bound, but could assist with OOB transfers. Patient today requiring heavy physical assist for all bed level mobility and sit to stand transfers (x3) at bedside for improved bed positioning. Requires assist at trunk for balance and stability with inability to take steps. Patient to benefit from continued PT services - if ALF is able to provide level of assist required would recommend return, otherwise would need SNF. PT to continue to follow.     Follow Up Recommendations Home health PT;SNF;Supervision/Assistance - 24 hour(if ALF is able to provide care would likely most benefit)    Equipment Recommendations  None recommended by PT(defer)    Recommendations for Other Services       Precautions / Restrictions Precautions Precautions: Fall Precaution Comments: sling for comfort Required Braces or Orthoses: Sling Restrictions Weight Bearing Restrictions: Yes LUE Weight Bearing: Non weight bearing      Mobility  Bed Mobility Overal bed mobility: Needs Assistance Bed Mobility: Supine to Sit;Sit to Supine     Supine to sit: Max assist;+2 for physical assistance Sit to supine: Max assist;+2 for physical assistance      Transfers Overall transfer level: Needs assistance Equipment used: 2  person hand held assist Transfers: Sit to/from Stand Sit to Stand: Max assist;+2 physical assistance         General transfer comment: 2 person assist to scoot up to head of bed.   Ambulation/Gait             General Gait Details: unable to take any steps  Stairs            Wheelchair Mobility    Modified Rankin (Stroke Patients Only)       Balance Overall balance assessment: Needs assistance Sitting-balance support: Feet supported;Single extremity supported Sitting balance-Leahy Scale: Poor Sitting balance - Comments: static sitting is fair, dynamic is poor   Standing balance support: Bilateral upper extremity supported;Single extremity supported Standing balance-Leahy Scale: Zero Standing balance comment: max A x2                             Pertinent Vitals/Pain Pain Assessment: Faces Faces Pain Scale: Hurts a little bit Pain Descriptors / Indicators: Grimacing Pain Intervention(s): Limited activity within patient's tolerance;Monitored during session;Repositioned    Home Living Family/patient expects to be discharged to:: Assisted living     Type of Home: Assisted living Home Access: Level entry     Home Layout: One level Home Equipment: Wheelchair - manual      Prior Function Level of Independence: Needs assistance         Comments: patient is a poor historian - unknown     Hand Dominance   Dominant Hand: Right    Extremity/Trunk Assessment   Upper Extremity Assessment Upper Extremity Assessment: Defer  to OT evaluation    Lower Extremity Assessment Lower Extremity Assessment: Generalized weakness RLE Deficits / Details: able to walk LE towards EOB and bear weight in supported standing LLE Deficits / Details: able to walk LE towards EOB and bear weight in supported standing    Cervical / Trunk Assessment Cervical / Trunk Assessment: Kyphotic  Communication   Communication: HOH  Cognition Arousal/Alertness:  Awake/alert Behavior During Therapy: Flat affect Overall Cognitive Status: No family/caregiver present to determine baseline cognitive functioning Area of Impairment: Following commands;Safety/judgement;Problem solving                       Following Commands: Follows one step commands inconsistently Safety/Judgement: Decreased awareness of safety;Decreased awareness of deficits   Problem Solving: Slow processing;Decreased initiation;Difficulty sequencing;Requires verbal cues;Requires tactile cues        General Comments      Exercises     Assessment/Plan    PT Assessment Patient needs continued PT services  PT Problem List Decreased strength;Decreased mobility;Decreased safety awareness;Decreased knowledge of precautions;Decreased range of motion;Decreased activity tolerance;Decreased cognition;Pain;Decreased balance       PT Treatment Interventions Therapeutic activities;Therapeutic exercise;Patient/family education;Wheelchair mobility training;Functional mobility training;Balance training;DME instruction;Neuromuscular re-education    PT Goals (Current goals can be found in the Care Plan section)  Acute Rehab PT Goals Patient Stated Goal: none stated PT Goal Formulation: Patient unable to participate in goal setting Time For Goal Achievement: 01/19/20 Potential to Achieve Goals: Fair    Frequency Min 3X/week   Barriers to discharge        Co-evaluation PT/OT/SLP Co-Evaluation/Treatment: Yes Reason for Co-Treatment: Complexity of the patient's impairments (multi-system involvement);Necessary to address cognition/behavior during functional activity;For patient/therapist safety;To address functional/ADL transfers PT goals addressed during session: Mobility/safety with mobility;Balance         AM-PAC PT "6 Clicks" Mobility  Outcome Measure Help needed turning from your back to your side while in a flat bed without using bedrails?: Total Help needed moving  from lying on your back to sitting on the side of a flat bed without using bedrails?: Total Help needed moving to and from a bed to a chair (including a wheelchair)?: Total Help needed standing up from a chair using your arms (e.g., wheelchair or bedside chair)?: A Lot Help needed to walk in hospital room?: Total Help needed climbing 3-5 steps with a railing? : Total 6 Click Score: 7    End of Session Equipment Utilized During Treatment: Gait belt Activity Tolerance: Patient tolerated treatment well;Patient limited by fatigue Patient left: in bed;with call bell/phone within reach;with bed alarm set Nurse Communication: Mobility status PT Visit Diagnosis: Unsteadiness on feet (R26.81);Other abnormalities of gait and mobility (R26.89);Muscle weakness (generalized) (M62.81);History of falling (Z91.81)    Time: [id number] PT Time Calculation (min) (ACUTE ONLY): 21 min   Charges:   PT Evaluation $PT Eval High Complexity: 1 High         Lanney Gins, PT, DPT Supplemental Physical Therapist 01/05/20 12:21 PM Pager: [phone] Office: [phone]

## 2020-01-06 ENCOUNTER — Other Ambulatory Visit: Payer: Self-pay

## 2020-01-06 ENCOUNTER — Encounter (HOSPITAL_COMMUNITY): Payer: Self-pay | Admitting: Orthopedic Surgery

## 2020-01-06 LAB — COMPREHENSIVE METABOLIC PANEL
ALT: 20 U/L (ref 0–44)
AST: 40 U/L (ref 15–41)
Albumin: 2.8 g/dL — ABNORMAL LOW (ref 3.5–5.0)
Alkaline Phosphatase: 204 U/L — ABNORMAL HIGH (ref 38–126)
Anion gap: 12 (ref 5–15)
BUN: 28 mg/dL — ABNORMAL HIGH (ref 8–23)
CO2: 22 mmol/L (ref 22–32)
Calcium: 8 mg/dL — ABNORMAL LOW (ref 8.9–10.3)
Chloride: 106 mmol/L (ref 98–111)
Creatinine, Ser: 1.92 mg/dL — ABNORMAL HIGH (ref 0.44–1.00)
GFR calc Af Amer: 26 mL/min — ABNORMAL LOW (ref >60)
GFR calc non Af Amer: 22 mL/min — ABNORMAL LOW (ref >60)
Glucose, Bld: 151 mg/dL — ABNORMAL HIGH (ref 70–99)
Potassium: 3.9 mmol/L (ref 3.5–5.1)
Sodium: 140 mmol/L (ref 135–145)
Total Bilirubin: 1.2 mg/dL (ref 0.3–1.2)
Total Protein: 5.4 g/dL — ABNORMAL LOW (ref 6.5–8.1)

## 2020-01-06 LAB — CBC
HCT: 37.4 % (ref 36.0–46.0)
Hemoglobin: 11.8 g/dL — ABNORMAL LOW (ref 12.0–15.0)
MCH: 32.9 pg (ref 26.0–34.0)
MCHC: 31.6 g/dL (ref 30.0–36.0)
MCV: 104.2 fL — ABNORMAL HIGH (ref 80.0–100.0)
Platelets: 155 10*3/uL (ref 150–400)
RBC: 3.59 MIL/uL — ABNORMAL LOW (ref 3.87–5.11)
RDW: 21.3 % — ABNORMAL HIGH (ref 11.5–15.5)
WBC: 19.7 10*3/uL — ABNORMAL HIGH (ref 4.0–10.5)
nRBC: 0.2 % (ref 0.0–0.2)

## 2020-01-06 LAB — SARS CORONAVIRUS 2 (TAT 6-24 HRS): SARS Coronavirus 2: NEGATIVE

## 2020-01-06 LAB — CALCIUM, IONIZED: Calcium, Ionized, Serum: 4.4 mg/dL — ABNORMAL LOW (ref 4.5–5.6)

## 2020-01-06 MED ORDER — HALOPERIDOL 0.5 MG PO TABS
0.5000 mg | ORAL_TABLET | Freq: Four times a day (QID) | ORAL | Status: DC | PRN
  Administered 2020-01-08: 0.5 mg via ORAL
  Filled 2020-01-06 (×2): qty 1

## 2020-01-06 MED ORDER — ACETAMINOPHEN 500 MG PO TABS
1000.0000 mg | ORAL_TABLET | Freq: Three times a day (TID) | ORAL | Status: DC
  Administered 2020-01-06 – 2020-01-07 (×3): 1000 mg via ORAL
  Administered 2020-01-07: 500 mg via ORAL
  Administered 2020-01-07 – 2020-01-08 (×3): 1000 mg via ORAL
  Filled 2020-01-06 (×9): qty 2

## 2020-01-06 MED ORDER — POTASSIUM CHLORIDE IN NACL 20-0.9 MEQ/L-% IV SOLN
INTRAVENOUS | Status: DC
  Filled 2020-01-06 (×4): qty 1000

## 2020-01-06 NOTE — Plan of Care (Signed)
Pt had a good day, no need for tele sitter or mittens, has been very pleasant, slept most of the day, drank a few sips of ensure and a couple bites of applesauce with medications, otherwise no appetite really, which per the son has been her usual. Restarted IV fluids, got a new IV.  Problem: Education: Goal: Knowledge of General Education information will improve Description: Including pain rating scale, medication(s)/side effects and non-pharmacologic comfort measures Outcome: Progressing   Problem: Coping: Goal: Level of anxiety will decrease Outcome: Progressing   Problem: Pain Managment: Goal: General experience of comfort will improve Outcome: Progressing Note: Getting scheduled tylenol    Problem: Safety: Goal: Ability to remain free from injury will improve Outcome: Progressing

## 2020-01-06 NOTE — Progress Notes (Addendum)
Patient was up all night constantly putting legs over side rails attempt to get up    Arm has some increase in edema because not elevating/ice, using hand to pull off mitt off right hand, and struggling/twisting in bed to get up.  Mitt on patient to prevent pulling off vac.  Some bleeding to lips because patient is biting the velcro on mitt to pull off.  Unable to follow any instructions from staff.  IV still out because refusal, able to get vitals.

## 2020-01-06 NOTE — Progress Notes (Signed)
Called son this morning, will be here around 0800 to sit with his mom and help calm her.  Patient is still and asleep at present.

## 2020-01-06 NOTE — Progress Notes (Signed)
Pulled IV out, refused vital signs tonight.  Will take vitals and restart IV when patient is calm and cooperative.

## 2020-01-06 NOTE — Progress Notes (Addendum)
Orthopaedic Trauma Service Progress Note  Patient ID: Shannon Joyce MRN: [id number] DOB/AGE: Dec 19, 1924 86 y.o.  Subjective:  Events overnight reviewed Appears pt has some significant sundowning last night  Currently pt is much better Denies any pain in left arm  She is calm and cooperative Son at bedside   Does not know where she is at currently   CBC and CMET pending   Pt has not had any narcotics since surgery (only had 100 mcg of fentanyl intra-op)  ROS As above  Objective:   VITALS:   Vitals:   01/05/20 2300 01/06/20 0456 01/06/20 0808 01/06/20 0835  BP:  (!) 154/89 (!) 137/94   Pulse:  (!) 108 (!) 108   Resp:   14   Temp:  97.6 F (36.4 C)    TempSrc:  Oral    SpO2: 94% 91% 93%   Weight:    52.2 kg  Height:    5\' 4"  (1.626 m)    Estimated body mass index is 19.74 kg/m as calculated from the following:   Height as of this encounter: 5\' 4"  (1.626 m).   Weight as of this encounter: 52.2 kg.   Intake/Output      01/24 0701 - 01/25 0700 01/25 0701 - 01/26 0700   P.O. 360    I.V. (mL/kg) 429.2 383.1 (7.3)   Blood     Other     IV Piggyback 300    Total Intake(mL/kg) 1089.2 383.1 (7.3)   Urine (mL/kg/hr) 100    Drains     Blood     Total Output 100    Net +989.2 +383.1        Urine Occurrence 1 x 1 x   Stool Occurrence 1 x 1 x     LABS  No results found for this or any previous visit (from the past 24 hour(s)).   PHYSICAL EXAM:   Gen: sitting up in bed, NAD, appears comfortable Lungs: unlabored, clear B  Cardiac: tachy but regular  Abd: + BS, NTND Ext:       Left Upper Extremity   Dressing and splint c/d/i  Sling in place   Swelling markedly improved from pre-op  Ext warm   Radial, ulnar, median nv motor and sensory functions grossly intact  Incisional vac functioning well   Assessment/Plan: 2 Days Post-Op   Active Problems:   Open left humeral fracture  Open displaced spiral fracture of shaft of left humerus   Anti-infectives (From admission, onward)   Start     Dose/Rate Route Frequency Ordered Stop   01/05/20 0130  clindamycin (CLEOCIN) IVPB 600 mg     600 mg 100 mL/hr over 30 Minutes Intravenous Every 8 hours 01/05/20 0015 01/05/20 2159   01/05/20 0115  nitrofurantoin (MACRODANTIN) capsule 50 mg  Status:  Discontinued     50 mg Oral Daily at bedtime 01/05/20 0114 01/05/20 1448   01/04/20 1800  vancomycin (VANCOCIN) IVPB 1000 mg/200 mL premix     1,000 mg 200 mL/hr over 60 Minutes Intravenous  Once 01/04/20 1723 01/04/20 1853    .  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 After discussion with pharmacist we dc'd the antibiotic as it was indicated it would not be efficacious)        Think her agitation was exacerbated by the ativan    Will use haldol if needed but would strongly advocate for reorientation and behavioral interventions first.  Think once visiting hours are over  would try to keep room as quite as possible and dim or shut off the lights    Continue to monitor     - DVT/PE prophylaxis:             no pharmacologic treatment    - ID:             completed course of clinda   - Metabolic Bone Disease:             + vitamin d insufficiency              Supplement   - Activity:             PT/OT   - FEN/GI prophylaxis/Foley/Lines:             dysphagia 3 diet  Pt pulled out IV last night. Hold on new IV for now   - Impediments to fracture healing:             Open fracture             Seemingly increasing fall frequency             Vitamin D insufficiency             Dementia             Thyroid disease   - Dispo:             continue with inpatient care  Son would like for pt to go back to ALF  Probable dc to ALF tomorrow  Will order new COVID test      Jari Pigg, PA-C [phone] (C) 01/06/2020, 10:19 AM  [facility] [place] [address] [phone] Jenetta [phone] (F)   After 6pm on weekdays please call office number to get in touch with  on call provider or refer to [facility] and look to see who is on call for the Sports Medicine Call Group which is listed under orthopaedics   On Weekends please call office number to get in touch with on call provider or refer to [place] and look to see who is on call for the Sports Medicine Call Group which is listed under orthopaedics

## 2020-01-06 NOTE — Progress Notes (Signed)
Physical Therapy Treatment Patient Details Name: Shannon Joyce MRN: [id number] DOB: 1925-08-23 Today's Date: 01/06/2020    History of Present Illness Patient is a 86 y/o female presenting to the ED on 01/04/20 with open left humeral shaft fracture. Of note, patient admitted last week with left humerus fracture that was treated with ORIF on 12/27/19. Admitted currently for s/p acute fall with open left humeral shaft fracture and hardware failure. REVISION OPEN REDUCTION INTERNAL FIXATION (ORIF) LEFT HUMERAL SHAFT FRACTURE (Left) AND INFUSE ALLOGRAFTING on 01/04/20.  PMH HTN.    PT Comments    Pt continues to require significant physical assist for mobility at this time, max +1-2 for bed mobility and transfer to and from [facility] this session. Pt required significant assist for pericare and toileting, pt attempting to wipe without use of bath tissue. PT to continue to follow pt acutely to progress OOB mobility and maximize pt function.     Follow Up Recommendations  Home health PT;SNF;Supervision/Assistance - 24 hour(if ALF is able to provide care would likely most benefit)     Equipment Recommendations  None recommended by PT(defer)    Recommendations for Other Services       Precautions / Restrictions Precautions Precautions: Fall Precaution Comments: sling for comfort Required Braces or Orthoses: Sling Restrictions Weight Bearing Restrictions: Yes LUE Weight Bearing: Non weight bearing    Mobility  Bed Mobility Overal bed mobility: Needs Assistance Bed Mobility: Supine to Sit;Sit to Supine     Supine to sit: Max assist Sit to supine: Max assist;+2 for safety/equipment;HOB elevated;+2 for physical assistance   General bed mobility comments: Max assist +1 to 2 for trunk and LE management, scooting to and from EOB, and lines/leads management.  Transfers Overall transfer level: Needs assistance Equipment used: 1 person hand held assist Transfers: Sit to/from [employer] Sit to Stand: Mod assist;From elevated surface   Squat pivot transfers: Max assist;From elevated surface     General transfer comment: Mod assist for sit to stand, trial x2 but pt unable to sustain upright standing for longer than 10 seconds. PT opted for squat pivot to and from [facility] for pt safety and due to pt's global weakness, squat pivot x2 with max assist for truncal elevation, steadying, and controlled lowering onto [facility] and bed.  Ambulation/Gait             General Gait Details: unable to take any steps   Stairs             Wheelchair Mobility    Modified Rankin (Stroke Patients Only)       Balance Overall balance assessment: Needs assistance Sitting-balance support: Feet supported;Single extremity supported Sitting balance-Leahy Scale: Fair Sitting balance - Comments: able to sit EOB without PT support, scooting towards EOB with posterior leaning Postural control: Posterior lean Standing balance support: Single extremity supported;During functional activity Standing balance-Leahy Scale: Zero Standing balance comment: max assist for squat pivot, unable to progress to stand pivot due to pt weakness                            Cognition Arousal/Alertness: Awake/alert Behavior During Therapy: Flat affect;Impulsive Overall Cognitive Status: No family/caregiver present to determine baseline cognitive functioning Area of Impairment: Following commands;Safety/judgement;Problem solving;Orientation;Memory                 Orientation Level: Disoriented to;Place;Time;Situation   Memory: Decreased short-term memory Following Commands: Follows one step commands inconsistently Safety/Judgement: Decreased  awareness of safety;Decreased awareness of deficits   Problem Solving: Decreased initiation;Difficulty sequencing;Requires verbal cues;Requires tactile cues General Comments: Pt attempting to bring LEs over EOB and once sitting EOB, tried to  stand without PT assist. Pt asked multiple times "can I have bath tissue?" during toileting when PT was getting it, as if she forgot she had asked. Multimodal cuing required for pt safety.      Exercises      General Comments General comments (skin integrity, edema, etc.): SpO2 84% on RA during toileting, replaced 1LO2 with recovery >90%. HRmax 120 bpm.      Pertinent Vitals/Pain Pain Assessment: Faces Faces Pain Scale: Hurts little more Pain Location: LUE, with mobility Pain Descriptors / Indicators: Grimacing;Guarding Pain Intervention(s): Limited activity within patient's tolerance;Monitored during session;Repositioned    Home Living                      Prior Function            PT Goals (current goals can now be found in the care plan section) Acute Rehab PT Goals Patient Stated Goal: none stated PT Goal Formulation: Patient unable to participate in goal setting Time For Goal Achievement: 01/19/20 Potential to Achieve Goals: Fair Progress towards PT goals: Progressing toward goals    Frequency    Min 3X/week      PT Plan Current plan remains appropriate    Co-evaluation              AM-PAC PT "6 Clicks" Mobility   Outcome Measure  Help needed turning from your back to your side while in a flat bed without using bedrails?: A Lot Help needed moving from lying on your back to sitting on the side of a flat bed without using bedrails?: Total Help needed moving to and from a bed to a chair (including a wheelchair)?: Total Help needed standing up from a chair using your arms (e.g., wheelchair or bedside chair)?: Total Help needed to walk in hospital room?: Total Help needed climbing 3-5 steps with a railing? : Total 6 Click Score: 7    End of Session Equipment Utilized During Treatment: Gait belt Activity Tolerance: Patient tolerated treatment well;Patient limited by fatigue Patient left: in bed;with call bell/phone within reach;with bed alarm  set Nurse Communication: Mobility status PT Visit Diagnosis: Unsteadiness on feet (R26.81);Other abnormalities of gait and mobility (R26.89);Muscle weakness (generalized) (M62.81);History of falling (Z91.81)     Time: [id number] PT Time Calculation (min) (ACUTE ONLY): 34 min  Charges:  $Therapeutic Activity: 8-22 mins $Self Care/Home Management: 8-22                     Oveta Idris E, PT Acute Rehabilitation Services Pager [phone]  Office [phone]  Quaron Delacruz D Elonda Husky 01/06/2020, 5:32 PM

## 2020-01-07 ENCOUNTER — Encounter (HOSPITAL_COMMUNITY): Payer: Self-pay | Admitting: Orthopedic Surgery

## 2020-01-07 DIAGNOSIS — E559 Vitamin D deficiency, unspecified: Secondary | ICD-10-CM

## 2020-01-07 DIAGNOSIS — N189 Chronic kidney disease, unspecified: Secondary | ICD-10-CM | POA: Diagnosis present

## 2020-01-07 HISTORY — DX: Vitamin D deficiency, unspecified: E55.9

## 2020-01-07 LAB — RENAL FUNCTION PANEL
Albumin: 2.3 g/dL — ABNORMAL LOW (ref 3.5–5.0)
Anion gap: 9 (ref 5–15)
BUN: 30 mg/dL — ABNORMAL HIGH (ref 8–23)
CO2: 23 mmol/L (ref 22–32)
Calcium: 8.1 mg/dL — ABNORMAL LOW (ref 8.9–10.3)
Chloride: 113 mmol/L — ABNORMAL HIGH (ref 98–111)
Creatinine, Ser: 1.79 mg/dL — ABNORMAL HIGH (ref 0.44–1.00)
GFR calc Af Amer: 28 mL/min — ABNORMAL LOW (ref >60)
GFR calc non Af Amer: 24 mL/min — ABNORMAL LOW (ref >60)
Glucose, Bld: 123 mg/dL — ABNORMAL HIGH (ref 70–99)
Phosphorus: 2.9 mg/dL (ref 2.5–4.6)
Potassium: 4.3 mmol/L (ref 3.5–5.1)
Sodium: 145 mmol/L (ref 135–145)

## 2020-01-07 LAB — CBC
HCT: 36.3 % (ref 36.0–46.0)
Hemoglobin: 11.4 g/dL — ABNORMAL LOW (ref 12.0–15.0)
MCH: 33.2 pg (ref 26.0–34.0)
MCHC: 31.4 g/dL (ref 30.0–36.0)
MCV: 105.8 fL — ABNORMAL HIGH (ref 80.0–100.0)
Platelets: 135 10*3/uL — ABNORMAL LOW (ref 150–400)
RBC: 3.43 MIL/uL — ABNORMAL LOW (ref 3.87–5.11)
RDW: 20.5 % — ABNORMAL HIGH (ref 11.5–15.5)
WBC: 13.3 10*3/uL — ABNORMAL HIGH (ref 4.0–10.5)
nRBC: 0 % (ref 0.0–0.2)

## 2020-01-07 MED ORDER — VITAMIN D 25 MCG (1000 UNIT) PO TABS
2000.0000 [IU] | ORAL_TABLET | Freq: Two times a day (BID) | ORAL | Status: DC
  Administered 2020-01-07 – 2020-01-08 (×3): 2000 [IU] via ORAL
  Filled 2020-01-07 (×3): qty 2

## 2020-01-07 MED ORDER — ACETAMINOPHEN 500 MG PO TABS
1000.0000 mg | ORAL_TABLET | Freq: Once | ORAL | Status: AC
  Administered 2020-01-07: 1000 mg via ORAL

## 2020-01-07 MED ORDER — LEVOTHYROXINE SODIUM 25 MCG PO TABS
12.5000 ug | ORAL_TABLET | Freq: Every day | ORAL | Status: DC
  Administered 2020-01-07 – 2020-01-08 (×2): 12.5 ug via ORAL
  Filled 2020-01-07: qty 1

## 2020-01-07 MED ORDER — METOPROLOL SUCCINATE ER 25 MG PO TB24
12.5000 mg | ORAL_TABLET | Freq: Two times a day (BID) | ORAL | Status: DC
  Administered 2020-01-07 – 2020-01-08 (×3): 12.5 mg via ORAL
  Filled 2020-01-07 (×3): qty 1

## 2020-01-07 MED ORDER — ASPIRIN EC 81 MG PO TBEC
81.0000 mg | DELAYED_RELEASE_TABLET | Freq: Every day | ORAL | Status: DC
  Administered 2020-01-07 – 2020-01-08 (×2): 81 mg via ORAL
  Filled 2020-01-07 (×2): qty 1

## 2020-01-07 MED ORDER — MIRTAZAPINE 15 MG PO TABS
7.5000 mg | ORAL_TABLET | Freq: Every day | ORAL | Status: DC
  Administered 2020-01-07: 7.5 mg via ORAL
  Filled 2020-01-07: qty 1

## 2020-01-07 MED ORDER — CALCIUM CITRATE 950 (200 CA) MG PO TABS
200.0000 mg | ORAL_TABLET | Freq: Two times a day (BID) | ORAL | Status: DC
  Administered 2020-01-07 – 2020-01-08 (×3): 200 mg via ORAL
  Filled 2020-01-07 (×4): qty 1

## 2020-01-07 MED ORDER — PANTOPRAZOLE SODIUM 40 MG PO TBEC
40.0000 mg | DELAYED_RELEASE_TABLET | Freq: Every day | ORAL | Status: DC
  Administered 2020-01-07 – 2020-01-08 (×2): 40 mg via ORAL
  Filled 2020-01-07 (×2): qty 1

## 2020-01-07 NOTE — Progress Notes (Signed)
Orthopaedic Trauma Service Progress Note  Patient ID: Shannon Joyce MRN: [id number] DOB/AGE: [birth year] 84 y.o.  Subjective:  Doing well No acute issues  Tele sitter in place  Delirium precautions in place as well   ROS As above  Objective:   VITALS:   Vitals:   01/07/20 0749 01/07/20 0750 01/07/20 1014 01/07/20 1218  BP: (!) 92/57 (!) 93/54 (!) 120/55 (!) 101/58  Pulse: (!) 116 (!) 112 (!) 108 (!) 108  Resp: 17 17  16   Temp: 98.6 F (37 C) 98.7 F (37.1 C) 98.8 F (37.1 C) 98.8 F (37.1 C)  TempSrc: Oral Oral Oral Oral  SpO2: (!) 89% 91% 94% (!) 89%  Weight:      Height:        Estimated body mass index is 19.74 kg/m as calculated from the following:   Height as of this encounter: 5\' 4"  (1.626 m).   Weight as of this encounter: 52.2 kg.   Intake/Output      01/25 0701 - 01/26 0700 01/26 0701 - 01/27 0700   P.O. 120 120   I.V. (mL/kg) 1430.7 (27.4)    IV Piggyback     Total Intake(mL/kg) 1550.7 (29.7) 120 (2.3)   Urine (mL/kg/hr)     Total Output     Net +1550.7 +120        Urine Occurrence 1 x    Stool Occurrence 1 x      LABS  Results for orders placed or performed during the hospital encounter of 01/04/20 (from the past 24 hour(s))  Renal function panel     Status: Abnormal   Collection Time: 01/07/20  2:45 AM  Result Value Ref Range   Sodium 145 135 - 145 mmol/L   Potassium 4.3 3.5 - 5.1 mmol/L   Chloride 113 (H) 98 - 111 mmol/L   CO2 23 22 - 32 mmol/L   Glucose, Bld 123 (H) 70 - 99 mg/dL   BUN 30 (H) 8 - 23 mg/dL   Creatinine, Ser 1.79 (H) 0.44 - 1.00 mg/dL   Calcium 8.1 (L) 8.9 - 10.3 mg/dL   Phosphorus 2.9 2.5 - 4.6 mg/dL   Albumin 2.3 (L) 3.5 - 5.0 g/dL   GFR calc non Af Amer 24 (L) >60 mL/min   GFR calc Af Amer 28 (L) >60 mL/min   Anion gap 9 5 - 15  CBC     Status: Abnormal   Collection Time: 01/07/20  2:45 AM  Result Value Ref Range   WBC 13.3 (H) 4.0 - 10.5  K/uL   RBC 3.43 (L) 3.87 - 5.11 MIL/uL   Hemoglobin 11.4 (L) 12.0 - 15.0 g/dL   HCT 36.3 36.0 - 46.0 %   MCV 105.8 (H) 80.0 - 100.0 fL   MCH 33.2 26.0 - 34.0 pg   MCHC 31.4 30.0 - 36.0 g/dL   RDW 20.5 (H) 11.5 - 15.5 %   Platelets 135 (L) 150 - 400 K/uL   nRBC 0.0 0.0 - 0.2 %     PHYSICAL EXAM:   Gen: sitting up in bed, NAD, appears comfortable Lungs: unlabored Cardiac: regular  Ext:       Left Upper Extremity              Dressing and splint c/d/i  Sling in place              Swelling markedly improved from pre-op             Ext warm              Radial, ulnar, median nv motor and sensory functions grossly intact             Incisional vac functioning well   Assessment/Plan: 3 Days Post-Op   Principal Problem:   Open left humeral fracture Active Problems:   AKI (acute kidney injury) ([place])   Open displaced spiral fracture of shaft of left humerus   Chronic kidney disease   Vitamin D insufficiency   Anti-infectives (From admission, onward)   Start     Dose/Rate Route Frequency Ordered Stop   01/05/20 0130  clindamycin (CLEOCIN) IVPB 600 mg     600 mg 100 mL/hr over 30 Minutes Intravenous Every 8 hours 01/05/20 0015 01/05/20 2159   01/05/20 0115  nitrofurantoin (MACRODANTIN) capsule 50 mg  Status:  Discontinued     50 mg Oral Daily at bedtime 01/05/20 0114 01/05/20 1448   01/04/20 1800  vancomycin (VANCOCIN) IVPB 1000 mg/200 mL premix     1,000 mg 200 mL/hr over 60 Minutes Intravenous  Once 01/04/20 1723 01/04/20 1853    .  POD/HD#: 51    86 year old female postop day 8 ORIF left humeral shaft s/p acute fall with open left humeral shaft fracture and hardware failure   -Open left humeral shaft fracture with retained hardware s/p I&D and revision              NWB L UEx             No active shoulder abduction              Ok to move fingers             Pt splinted to protect repair             Continue with prevena for incision healing              PT/OT             Ice and elevate for swelling and pain control     - Pain management:             scheduled tylenol    - ABL anemia/Hemodynamics             stable  Cbc tomorrow   - Medical issues              Acute on chronic renal failure (CKD IV)                         Appears to be resolving                         continue IVF   Check renal panel in am               Dementia                          stable last night   No reported agitation    Continue to minimize meds   No narcs or other psychotropic meds    - DVT/PE prophylaxis:             no pharmacologic  treatment    - ID:             completed course of clinda   - Metabolic Bone Disease:             + vitamin d insufficiency              Supplement   - Activity:             PT/OT   - FEN/GI prophylaxis/Foley/Lines:             dysphagia 3 diet            continue with IVF    - Impediments to fracture healing:             Open fracture             Seemingly increasing fall frequency             Vitamin D insufficiency             Dementia             Thyroid disease   - Dispo:             back to ALF tomorrow   Has done well today      Jari Pigg, PA-C [phone] (C) 01/07/2020, 4:32 PM  [facility] [place] [address] [phone] Jenetta [phone] (F)   After 6pm on weekdays please call office number to get in touch with on call provider or refer to Amion and look to see who is on call for the Sports Medicine Call Group which is listed under orthopaedics   On Weekends please call office number to get in touch with on call provider or refer to Amion and look to see who is on call for the Sports Medicine Call Group which is listed under orthopaedics

## 2020-01-07 NOTE — Clinical Social Work Note (Signed)
CSW spoke with Shannon Joyce [phone] in regards to patient returning. She states that they will need an updated signed FL2 and dc summary for review prior to her returning.   CSW sent message Lanny Hurst, [place] asking for the above. COVID test done on 1/25.   CSW spoke with patient's son Shannon Joyce [phone] and he states that he wants her to return at discharge.

## 2020-01-07 NOTE — NC FL2 (Signed)
[place] MEDICAID FL2 LEVEL OF CARE SCREENING TOOL     IDENTIFICATION  Patient Name: Shannon Joyce Birthdate: 06-22-1925 Sex: female Admission Date (Current Location): 01/04/2020  [facility] and [place] Number:  Herbalist and Address:  [facility] [place] [place] [address], [place], [place] [address]      Provider Number: [id number]  Attending Physician Name and Address:  Altamese Benjamin, MD  Relative Name and Phone Number:  Lataria Collen [phone]    Current Level of Care: Hospital Recommended Level of Care: Memory Care Prior Approval Number:    Date Approved/Denied:   PASRR Number:    Discharge Plan: Other (Comment)(Memory Care Unit)    Current Diagnoses: Patient Active Problem List   Diagnosis Date Noted  . Open displaced spiral fracture of shaft of left humerus 01/05/2020  . Open left humeral fracture 01/04/2020  . AKI (acute kidney injury) ([place])   . Displaced fracture of shaft of left humerus 12/28/2019  . Fracture of humeral shaft, left, closed 12/27/2019    Orientation RESPIRATION BLADDER Height & Weight     Self  Normal Incontinent Weight: 115 lb (52.2 kg) Height:  5\' 4"  (162.6 cm)  BEHAVIORAL SYMPTOMS/MOOD NEUROLOGICAL BOWEL NUTRITION STATUS      Continent Diet(see discharge summary)  AMBULATORY STATUS COMMUNICATION OF NEEDS Skin   Extensive Assist Verbally Normal                       Personal Care Assistance Level of Assistance  Bathing, Feeding, Dressing Bathing Assistance: Maximum assistance Feeding assistance: Limited assistance Dressing Assistance: Maximum assistance     Functional Limitations Info    Sight Info: Adequate Hearing Info: Adequate Speech Info: Impaired    SPECIAL CARE FACTORS FREQUENCY  PT (By licensed PT), OT (By licensed OT)     PT Frequency: 2-3 times a week OT Frequency: 2-3 times a week     Speech Therapy Frequency: 2-3 times a week      Contractures      Additional Factors  Info  Code Status Code Status Info: DNR             Current Medications (01/07/2020):  This is the current hospital active medication list Current Facility-Administered Medications  Medication Dose Route Frequency Provider Last Rate Last Admin  . 0.9 %  sodium chloride infusion (Manually program via Guardrails IV Fluids)   Intravenous Once Ainsley Spinner, PA-C      . 0.9 % NaCl with KCl 20 mEq/ L  infusion   Intravenous Continuous Ainsley Spinner, PA-C 75 mL/hr at 01/06/20 1900 Rate Verify at 01/06/20 1900  . acetaminophen (TYLENOL) tablet 1,000 mg  1,000 mg Oral Q8H Ainsley Spinner, PA-C   1,000 mg at 01/07/20 0630  . ascorbic acid (VITAMIN C) tablet 500 mg  500 mg Oral Daily Ainsley Spinner, PA-C   500 mg at 01/06/20 0910  . aspirin EC tablet 81 mg  81 mg Oral Daily Ainsley Spinner, PA-C      . calcium citrate (CALCITRATE - dosed in mg elemental calcium) tablet 200 mg of elemental calcium  200 mg of elemental calcium Oral BID Ainsley Spinner, PA-C      . cholecalciferol (VITAMIN D3) tablet 2,000 Units  2,000 Units Oral BID Ainsley Spinner, PA-C      . docusate (COLACE) 50 MG/5ML liquid 100 mg  100 mg Oral BID [place], Yujing Z, RPH   100 mg at 01/07/20 0110  . feeding supplement (ENSURE  ENLIVE) (ENSURE ENLIVE) liquid 237 mL  237 mL Oral TID BM Ainsley Spinner, PA-C   237 mL at 01/06/20 1346  . haloperidol (HALDOL) tablet 0.5 mg  0.5 mg Oral Q6H PRN Ainsley Spinner, PA-C      . levothyroxine (SYNTHROID) tablet 12.5 mcg  12.5 mcg Oral Q0600 Ainsley Spinner, PA-C      . metoCLOPramide (REGLAN) tablet 5-10 mg  5-10 mg Oral Q8H PRN Ainsley Spinner, PA-C       Or  . metoCLOPramide (REGLAN) injection 5-10 mg  5-10 mg Intravenous Q8H PRN Ainsley Spinner, PA-C      . metoprolol succinate (TOPROL-XL) 24 hr tablet 12.5 mg  12.5 mg Oral BID Ainsley Spinner, PA-C      . mirtazapine (REMERON) tablet 7.5 mg  7.5 mg Oral QHS Ainsley Spinner, PA-C      . ondansetron [facility]) tablet 4 mg  4 mg Oral Q6H PRN Ainsley Spinner, PA-C       Or  . ondansetron [facility])  injection 4 mg  4 mg Intravenous Q6H PRN Ainsley Spinner, PA-C      . pantoprazole (PROTONIX) EC tablet 40 mg  40 mg Oral Daily Ainsley Spinner, PA-C         Discharge Medications: Please see discharge summary for a list of discharge medications.  Relevant Imaging Results:  Relevant Lab Results:   Additional Information ss# [id number]  Atilano Median, LCSW

## 2020-01-07 NOTE — Progress Notes (Signed)
Physical Therapy Treatment Patient Details Name: Shannon Joyce MRN: [id number] DOB: [birth year] Today's Date: 01/07/2020    History of Present Illness Patient is a 86 y/o female presenting to the ED on 01/04/20 with open left humeral shaft fracture. Of note, patient admitted last week with left humerus fracture that was treated with ORIF on 12/27/19. Admitted currently for s/p acute fall with open left humeral shaft fracture and hardware failure. REVISION OPEN REDUCTION INTERNAL FIXATION (ORIF) LEFT HUMERAL SHAFT FRACTURE (Left) AND INFUSE ALLOGRAFTING on 01/04/20.  PMH HTN.    PT Comments    Pt in significant LUE pain upon PT arrival to room, PT placed LUE sling and pt expressed pain relief. Pt agreeable to EOB sitting and LE exercises only today, states she is too weak to stand and is resistant to PT attempts in facilitating standing or transfer. PT to continue to follow acutely and will progress mobility as able.    Follow Up Recommendations  Home health PT;SNF;Supervision/Assistance - 24 hour(if ALF is able to provide care would likely most benefit)     Equipment Recommendations  None recommended by PT(defer)    Recommendations for Other Services       Precautions / Restrictions Precautions Precautions: Fall Precaution Comments: sling for comfort Required Braces or Orthoses: Sling Restrictions Weight Bearing Restrictions: Yes LUE Weight Bearing: Non weight bearing    Mobility  Bed Mobility Overal bed mobility: Needs Assistance Bed Mobility: Supine to Sit;Sit to Supine     Supine to sit: Max assist;HOB elevated Sit to supine: Max assist;HOB elevated   General bed mobility comments: Max assist for trunk and LE management, scooting to and from EOB, and lines/leads management. Pt sat EOB ~5 minutes, PT adjusted sling and had pt perform LE exercise at EOB.  Transfers                 General transfer comment: Pt refuses, physically resists PT attempt to assist pt to  stand  Ambulation/Gait             General Gait Details: unable   Stairs             Wheelchair Mobility    Modified Rankin (Stroke Patients Only)       Balance Overall balance assessment: Needs assistance Sitting-balance support: Feet supported;Single extremity supported Sitting balance-Leahy Scale: Fair Sitting balance - Comments: able to sit EOB without PT support, posterior leaning with LE movement Postural control: Posterior lean                                  Cognition Arousal/Alertness: Awake/alert Behavior During Therapy: Flat affect Overall Cognitive Status: No family/caregiver present to determine baseline cognitive functioning Area of Impairment: Following commands;Safety/judgement;Problem solving;Orientation;Memory                 Orientation Level: Disoriented to;Place;Time;Situation   Memory: Decreased short-term memory Following Commands: Follows one step commands consistently;Follows one step commands with increased time Safety/Judgement: Decreased awareness of safety;Decreased awareness of deficits   Problem Solving: Decreased initiation;Difficulty sequencing;Requires verbal cues;Requires tactile cues        Exercises General Exercises - Lower Extremity Ankle Circles/Pumps: AROM;Both;10 reps;Seated Long Arc Quad: AROM;Both;10 reps;Seated Hip ABduction/ADduction: Both;10 reps;Seated;AAROM    General Comments General comments (skin integrity, edema, etc.): SpO2 93% and above on RA      Pertinent Vitals/Pain Pain Assessment: Faces Faces Pain Scale: Hurts even more Pain Location: LUE  Pain Descriptors / Indicators: Grimacing;Guarding Pain Intervention(s): Limited activity within patient's tolerance;Monitored during session;Repositioned    Home Living                      Prior Function            PT Goals (current goals can now be found in the care plan section) Acute Rehab PT Goals Patient Stated  Goal: none stated PT Goal Formulation: Patient unable to participate in goal setting Time For Goal Achievement: 01/19/20 Potential to Achieve Goals: Fair Progress towards PT goals: Progressing toward goals    Frequency    Min 3X/week      PT Plan Current plan remains appropriate    Co-evaluation              AM-PAC PT "6 Clicks" Mobility   Outcome Measure  Help needed turning from your back to your side while in a flat bed without using bedrails?: A Lot Help needed moving from lying on your back to sitting on the side of a flat bed without using bedrails?: Total Help needed moving to and from a bed to a chair (including a wheelchair)?: Total Help needed standing up from a chair using your arms (e.g., wheelchair or bedside chair)?: Total Help needed to walk in hospital room?: Total Help needed climbing 3-5 steps with a railing? : Total 6 Click Score: 7    End of Session Equipment Utilized During Treatment: Gait belt Activity Tolerance: Patient tolerated treatment well;Patient limited by fatigue Patient left: in bed;with call bell/phone within reach;with bed alarm set;with SCD's reapplied Nurse Communication: Mobility status PT Visit Diagnosis: Unsteadiness on feet (R26.81);Other abnormalities of gait and mobility (R26.89);Muscle weakness (generalized) (M62.81);History of falling (Z91.81)     Time: [id number] PT Time Calculation (min) (ACUTE ONLY): 27 min  Charges:  $Therapeutic Exercise: 8-22 mins $Therapeutic Activity: 8-22 mins                     Shealeigh Dunstan E, PT Acute Rehabilitation Services Pager [phone]  Office [phone]    Rhealynn Myhre D Elonda Husky 01/07/2020, 5:26 PM

## 2020-01-07 NOTE — Progress Notes (Addendum)
MD and Tech aware patient is a yellow mews.  Ainsley Spinner, PA adjusted medication.

## 2020-01-08 ENCOUNTER — Encounter (HOSPITAL_COMMUNITY): Payer: Self-pay | Admitting: Orthopedic Surgery

## 2020-01-08 DIAGNOSIS — F0390 Unspecified dementia without behavioral disturbance: Secondary | ICD-10-CM

## 2020-01-08 DIAGNOSIS — N390 Urinary tract infection, site not specified: Secondary | ICD-10-CM

## 2020-01-08 HISTORY — DX: Unspecified dementia, unspecified severity, without behavioral disturbance, psychotic disturbance, mood disturbance, and anxiety: F03.90

## 2020-01-08 HISTORY — DX: Urinary tract infection, site not specified: N39.0

## 2020-01-08 LAB — COMPREHENSIVE METABOLIC PANEL
ALT: 16 U/L (ref 0–44)
AST: 25 U/L (ref 15–41)
Albumin: 2.1 g/dL — ABNORMAL LOW (ref 3.5–5.0)
Alkaline Phosphatase: 161 U/L — ABNORMAL HIGH (ref 38–126)
Anion gap: 5 (ref 5–15)
BUN: 32 mg/dL — ABNORMAL HIGH (ref 8–23)
CO2: 22 mmol/L (ref 22–32)
Calcium: 7.9 mg/dL — ABNORMAL LOW (ref 8.9–10.3)
Chloride: 115 mmol/L — ABNORMAL HIGH (ref 98–111)
Creatinine, Ser: 1.65 mg/dL — ABNORMAL HIGH (ref 0.44–1.00)
GFR calc Af Amer: 31 mL/min — ABNORMAL LOW (ref >60)
GFR calc non Af Amer: 27 mL/min — ABNORMAL LOW (ref >60)
Glucose, Bld: 110 mg/dL — ABNORMAL HIGH (ref 70–99)
Potassium: 5.6 mmol/L — ABNORMAL HIGH (ref 3.5–5.1)
Sodium: 142 mmol/L (ref 135–145)
Total Bilirubin: 1 mg/dL (ref 0.3–1.2)
Total Protein: 4.4 g/dL — ABNORMAL LOW (ref 6.5–8.1)

## 2020-01-08 MED ORDER — VITAMIN D3 25 MCG PO TABS: 2000.0000 [IU] | ORAL_TABLET | Freq: Two times a day (BID) | ORAL | Status: AC

## 2020-01-08 MED ORDER — ASCORBIC ACID 500 MG PO TABS: 500.0000 mg | ORAL_TABLET | Freq: Every day | ORAL | Status: AC

## 2020-01-08 MED ORDER — CALCIUM CITRATE 950 (200 CA) MG PO TABS: 200.0000 mg | ORAL_TABLET | Freq: Two times a day (BID) | ORAL | Status: AC

## 2020-01-08 NOTE — Progress Notes (Addendum)
Orthopaedic Trauma Service Progress Note  Patient ID: Shannon Joyce MRN: [id number] DOB/AGE: 1925/08/16 86 y.o.  Subjective:  Confused overnight Pulled off incisional vac Calm this am  Eating breakfast this am   No other issues of note    ROS As above  Objective:   VITALS:   Vitals:   01/07/20 2140 01/08/20 0317 01/08/20 0741 01/08/20 0916  BP: 124/63 130/74 (!) 150/80 (!) 150/80  Pulse: (!) 101 91 92 92  Resp: 18 16 16    Temp: 99 F (37.2 C) 98.6 F (37 C) 98.1 F (36.7 C)   TempSrc: Oral  Oral   SpO2: 94% 96% 96%   Weight:      Height:        Estimated body mass index is 19.74 kg/m as calculated from the following:   Height as of this encounter: 5\' 4"  (1.626 m).   Weight as of this encounter: 52.2 kg.   Intake/Output      01/26 0701 - 01/27 0700 01/27 0701 - 01/28 0700   P.O. 240 120   I.V. (mL/kg)     Total Intake(mL/kg) 240 (4.6) 120 (2.3)   Urine (mL/kg/hr) 600 (0.5)    Total Output 600    Net -360 +120          LABS  Results for orders placed or performed during the hospital encounter of 01/04/20 (from the past 24 hour(s))  Comprehensive metabolic panel     Status: Abnormal   Collection Time: 01/08/20  2:27 AM  Result Value Ref Range   Sodium 142 135 - 145 mmol/L   Potassium 5.6 (H) 3.5 - 5.1 mmol/L   Chloride 115 (H) 98 - 111 mmol/L   CO2 22 22 - 32 mmol/L   Glucose, Bld 110 (H) 70 - 99 mg/dL   BUN 32 (H) 8 - 23 mg/dL   Creatinine, Ser 1.65 (H) 0.44 - 1.00 mg/dL   Calcium 7.9 (L) 8.9 - 10.3 mg/dL   Total Protein 4.4 (L) 6.5 - 8.1 g/dL   Albumin 2.1 (L) 3.5 - 5.0 g/dL   AST 25 15 - 41 U/L   ALT 16 0 - 44 U/L   Alkaline Phosphatase 161 (H) 38 - 126 U/L   Total Bilirubin 1.0 0.3 - 1.2 mg/dL   GFR calc non Af Amer 27 (L) >60 mL/min   GFR calc Af Amer 31 (L) >60 mL/min   Anion gap 5 5 - 15   Visible to patient:  No (not released)  Next appt:  None Specimen  Information: Urine, Random     Component 1 d ago  Specimen Description URINE, RANDOM   Special Requests NONE   Culture Abnormal  >=100,000 COLONIES/mL KLEBSIELLA PNEUMONIAE  IDENTIFICATION AND SUSCEPTIBILITIES TO FOLLOW  Performed at [facility], [address], [place], [place] [address]    Report Status PENDING   Resulting Agency [facility]      Specimen Collected: 01/07/20 07:29  Last Resulted: 01/08/20 08:55     Lab Flowsheet   Order Details   View Encounter   Lab and Collection Details   Routing   Result History          PHYSICAL EXAM:   Gen: sitting up in bed, NAD, appears comfortable Lungs: unlabored Cardiac: regular, s1 and s2 Ext:  Left Upper Extremity              incisional vac removed  Splint and dressing disheveled but splint is still fitting well  Ace wrap has been removed from the left upper extremity.  Only the webril and splint remains in place   Extremities warm there is moderate swelling to the hand today but still less so than on admission  Moves digits without difficulty  Incision is clean, dry and intact.  No signs of infection no active drainage.  Overall incision looks great   New 4 x 4's applied to the incision and a new Ace wrap applied from hand to mid upper arm.  Tape was placed over the exposed 4 x 4's  Assessment/Plan: 4 Days Post-Op   Principal Problem:   Open left humeral fracture Active Problems:   AKI (acute kidney injury) ([place])   Open displaced spiral fracture of shaft of left humerus   Chronic kidney disease   Vitamin D insufficiency   Anti-infectives (From admission, onward)   Start     Dose/Rate Route Frequency Ordered Stop   01/05/20 0130  clindamycin (CLEOCIN) IVPB 600 mg     600 mg 100 mL/hr over 30 Minutes Intravenous Every 8 hours 01/05/20 0015 01/05/20 2159   01/05/20 0115  nitrofurantoin (MACRODANTIN) capsule 50 mg  Status:  Discontinued     50 mg Oral Daily at bedtime 01/05/20 0114 01/05/20  1448   01/04/20 1800  vancomycin (VANCOCIN) IVPB 1000 mg/200 mL premix     1,000 mg 200 mL/hr over 60 Minutes Intravenous  Once 01/04/20 1723 01/04/20 1853    .  POD/HD#: 68  86 year old female postop day 8 ORIF left humeral shaft s/p acute fall with open left humeral shaft fracture and hardware failure   -Open left humeral shaft fracture with retained hardware s/p I&D and revision              NWB L UEx             No active shoulder abduction              Ok to move fingers             Pt splinted to protect repair             Ideally dressing and splint should be maintained until office follow-up in 1 week.  Facility can reinforce dressing as needed if patient continues to pull at it and remove it.  Please contact the office for any questions or concerns  Continue with aggressive elevation to help with swelling control hand and digits above elbow and elbow above heart as much as possible.  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)                         Stabilized               Dementia  Some confusion and agitation last night resulting in VAC removal   Currently appears stable   Chronic UTI   10^5 Klebsiella Pneumoniae    On chronic nitrofurantoin, will follow up on sensitivities and pass along to ALF    - DVT/PE prophylaxis:             no pharmacologic treatment    - ID:             completed course of clinda   - Metabolic Bone Disease:             + vitamin d insufficiency              Supplement   - Activity:             PT/OT   - FEN/GI prophylaxis/Foley/Lines:             dysphagia 3 diet            Discontinue IV and IV fluids in preparation for discharge   - Impediments to fracture healing:             Open  fracture             Seemingly increasing fall frequency             Vitamin D insufficiency             Dementia             Thyroid disease   - Dispo:             Discharged to assisted living facility today  Follow-up with orthopedics in 1 week    Jari Pigg, PA-C [phone] (C) 01/08/2020, 9:39 AM  [facility] [place] [address] [phone] Jenetta Downer862 [phone] (F)   After 6pm on weekdays please call office number to get in touch with on call provider or refer to [place] and look to see who is on call for the Sports Medicine Call Group which is listed under orthopaedics   On Weekends please call office number to get in touch with on call provider or refer to [place] and look to see who is on call for the Sports Medicine Call Group which is listed under orthopaedics

## 2020-01-08 NOTE — Plan of Care (Signed)
  Problem: Education: Goal: Knowledge of General Education information will improve Description: Including pain rating scale, medication(s)/side effects and non-pharmacologic comfort measures 01/08/2020 1857 by Madaline Brilliant, RN Outcome: Completed/Met 01/08/2020 0747 by Madaline Brilliant, RN Outcome: Progressing   Problem: Health Behavior/Discharge Planning: Goal: Ability to manage health-related needs will improve 01/08/2020 1857 by Madaline Brilliant, RN Outcome: Completed/Met 01/08/2020 0747 by Madaline Brilliant, RN Outcome: Progressing   Problem: Clinical Measurements: Goal: Ability to maintain clinical measurements within normal limits will improve 01/08/2020 1857 by Madaline Brilliant, RN Outcome: Completed/Met 01/08/2020 0747 by Madaline Brilliant, RN Outcome: Progressing Goal: Will remain free from infection 01/08/2020 1857 by Madaline Brilliant, RN Outcome: Completed/Met 01/08/2020 0747 by Madaline Brilliant, RN Outcome: Progressing Goal: Diagnostic test results will improve 01/08/2020 1857 by Madaline Brilliant, RN Outcome: Completed/Met 01/08/2020 0747 by Madaline Brilliant, RN Outcome: Progressing Goal: Respiratory complications will improve 01/08/2020 1857 by Madaline Brilliant, RN Outcome: Completed/Met 01/08/2020 0747 by Madaline Brilliant, RN Outcome: Progressing Goal: Cardiovascular complication will be avoided 01/08/2020 1857 by Madaline Brilliant, RN Outcome: Completed/Met 01/08/2020 0747 by Madaline Brilliant, RN Outcome: Progressing   Problem: Activity: Goal: Risk for activity intolerance will decrease 01/08/2020 1857 by Madaline Brilliant, RN Outcome: Completed/Met 01/08/2020 0747 by Madaline Brilliant, RN Outcome: Progressing   Problem: Nutrition: Goal: Adequate nutrition will be maintained 01/08/2020 1857 by Madaline Brilliant, RN Outcome: Completed/Met 01/08/2020 0747 by Madaline Brilliant, RN Outcome: Progressing   Problem: Coping: Goal: Level of anxiety will decrease 01/08/2020  1857 by Madaline Brilliant, RN Outcome: Completed/Met 01/08/2020 0747 by Madaline Brilliant, RN Outcome: Progressing   Problem: Elimination: Goal: Will not experience complications related to bowel motility 01/08/2020 1857 by Madaline Brilliant, RN Outcome: Completed/Met 01/08/2020 0747 by Madaline Brilliant, RN Outcome: Progressing Goal: Will not experience complications related to urinary retention 01/08/2020 1857 by Madaline Brilliant, RN Outcome: Completed/Met 01/08/2020 0747 by Madaline Brilliant, RN Outcome: Progressing   Problem: Pain Managment: Goal: General experience of comfort will improve 01/08/2020 1857 by Madaline Brilliant, RN Outcome: Completed/Met 01/08/2020 0747 by Madaline Brilliant, RN Outcome: Progressing   Problem: Safety: Goal: Ability to remain free from injury will improve 01/08/2020 1857 by Madaline Brilliant, RN Outcome: Completed/Met 01/08/2020 0747 by Madaline Brilliant, RN Outcome: Progressing   Problem: Skin Integrity: Goal: Risk for impaired skin integrity will decrease 01/08/2020 1857 by Madaline Brilliant, RN Outcome: Completed/Met 01/08/2020 0747 by Madaline Brilliant, RN Outcome: Progressing

## 2020-01-08 NOTE — Discharge Summary (Signed)
Orthopaedic Trauma Service (OTS) Discharge Summary   Patient ID: Shannon Joyce MRN: [id number] DOB/AGE: Jul 07, 1925 86 y.o.  Admit date: 01/04/2020 Discharge date: 01/08/2020  Admission Diagnoses: Open left humeral shaft fracture Failure of orthopedic implant Acute kidney injury Chronic kidney disease Vitamin D insufficiency Dementia Chronic UTI  Discharge Diagnoses:  Principal Problem:   Open left humeral fracture Active Problems:   AKI (acute kidney injury) ([place])   Open displaced spiral fracture of shaft of left humerus   Chronic kidney disease   Vitamin D insufficiency   Chronic UTI   Dementia ([place])   Past Medical History:  Diagnosis Date   Chronic kidney disease    Chronic UTI 01/08/2020   Dementia ([place])    Dementia ([place]) 01/08/2020   Hypertension    UTI (lower urinary tract infection)    Vitamin D insufficiency 01/07/2020     Procedures Performed: 01/05/2020-Dr. Marcelino Scot 1.  Revision open reduction internal fixation of left humeral shaft fracture. 2.  Removal of hardware, left humerus.    Discharged Condition: stable  Hospital Course:   Patient is a 86 year old white female who was admitted back to [facility] on 01/04/2020 after sustaining a fall at her assisted living facility.  She had just been discharged from the hospital on 01/02/2020 where initial ORIF of her left humerus was performed on 12/27/2019.  Unfortunately patient sustained a fall while at the assisted living facility.  This was unwitnessed.  Unfortunately the distal aspect of her humerus was protruding through her incision.  Orthopedics was consulted by the emergency department.  Given presence of open fracture patient was taken to the operating room on 01/04/2020 for procedure noted above was performed.  Her original hardware was removed and new hardware was placed after thorough irrigation debridement.  Her bone quality was extraordinarily poor and fixation was very limited.   After review of her medical records from previous hospitalization she had several day issue with acute delirium likely exacerbated by various medications and dehydration.  Given this we decided to splint the patient postoperatively to further protect the repair.  Prevena incisional VAC was also applied.  Postoperatively we did not prescribe any narcotics to manage her pain with Tylenol alone which was more than effective.  We also placed her on IV fluids to continue gentle IV hydration.  We continue to monitor her renal function which was steady during her hospital stay as well.  Overall patient did not have any significant issues during her hospital stay she did have some sundowning at night and on the night of postop day #1 did have significant confusion and agitation that did require some restraints.  Postop day 2 and 3 were much better.  We did order some Haldol as needed for agitation however she only needed 1 dose her entire hospital stay.  On the evening of postop day day 3 patient became confused and agitated to a lesser degree and ripped off her incisional wound VAC.  Fortunately she did not compromise her splint.  It was redressed in appropriate fashion.  On postoperative day #4 patient was deemed to be stable for discharge back to her ALF  We did obtain cultures of her urine during her hospital stay which currently show greater than 10 to the fifth colonies of Klebsiella pneumoniae.  Final sensitivities are pending at the time of discharge  Patient was noted to be vitamin D insufficient on her original admission.  She was started on vitamin D supplementation and calcium supplementation due  to low calcium and ionized calcium levels  Of note patient did start off with low H&H on admission due to her previous injury and subsequent surgery.  She did receive 2 units of packed red blood cells in the operating room.  Did not require any additional product afterwards.  We did use clindamycin for her open  fracture treatment given her numerous allergies.  She received 24hours of IV antibiotics  Consults: None  Significant Diagnostic Studies: labs:  Results for Shannon, Joyce (MRN [id number]) as of 01/08/2020 10:45  Ref. Range 01/08/2020 02:27  Sodium Latest Ref Range: 135 - 145 mmol/L 142  Potassium Latest Ref Range: 3.5 - 5.1 mmol/L 5.6 (H)  Chloride Latest Ref Range: 98 - 111 mmol/L 115 (H)  CO2 Latest Ref Range: 22 - 32 mmol/L 22  Glucose Latest Ref Range: 70 - 99 mg/dL 110 (H)  BUN Latest Ref Range: 8 - 23 mg/dL 32 (H)  Creatinine Latest Ref Range: 0.44 - 1.00 mg/dL 1.65 (H)  Calcium Latest Ref Range: 8.9 - 10.3 mg/dL 7.9 (L)  Anion gap Latest Ref Range: 5 - 15  5  Alkaline Phosphatase Latest Ref Range: 38 - 126 U/L 161 (H)  Albumin Latest Ref Range: 3.5 - 5.0 g/dL 2.1 (L)  AST Latest Ref Range: 15 - 41 U/L 25  ALT Latest Ref Range: 0 - 44 U/L 16  Total Protein Latest Ref Range: 6.5 - 8.1 g/dL 4.4 (L)  Total Bilirubin Latest Ref Range: 0.3 - 1.2 mg/dL 1.0  GFR, Est Non African American Latest Ref Range: >60 mL/min 27 (L)  GFR, Est African American Latest Ref Range: >60 mL/min 31 (L)   Results for Shannon, Joyce (MRN [id number]) as of 01/08/2020 10:45  Ref. Range 01/07/2020 02:45  WBC Latest Ref Range: 4.0 - 10.5 K/uL 13.3 (H)  RBC Latest Ref Range: 3.87 - 5.11 MIL/uL 3.43 (L)  Hemoglobin Latest Ref Range: 12.0 - 15.0 g/dL 11.4 (L)  HCT Latest Ref Range: 36.0 - 46.0 % 36.3  MCV Latest Ref Range: 80.0 - 100.0 fL 105.8 (H)  MCH Latest Ref Range: 26.0 - 34.0 pg 33.2  MCHC Latest Ref Range: 30.0 - 36.0 g/dL 31.4  RDW Latest Ref Range: 11.5 - 15.5 % 20.5 (H)  Platelets Latest Ref Range: 150 - 400 K/uL 135 (L)  nRBC Latest Ref Range: 0.0 - 0.2 % 0.0  Glucose Latest Ref Range: 70 - 99 mg/dL 123 (H)   Results for Shannon, Joyce (MRN [id number]) as of 01/08/2020 10:45  Ref. Range 12/30/2019 04:15  Vitamin D, 25-Hydroxy Latest Ref Range: 30 - 100 ng/mL 21.92 (L)    Status:  Preliminary result    Visible to patient:  No (not released) Next appt:  None Specimen Information: Urine, Random      Component 1 d ago  Specimen Description URINE, RANDOM   Special Requests NONE   Culture Abnormal  >=100,000 COLONIES/mL KLEBSIELLA PNEUMONIAE  IDENTIFICATION AND SUSCEPTIBILITIES TO FOLLOW  Performed at [facility] [address], [place], [place] [address]    Report Status PENDING   Resulting Agency [facility]      Specimen Collected: 01/07/20 07:29 Last Resulted: 01/08/20 08:55       Lab Flowsheet     Order Details     View Encounter     Lab and Collection Details     Routing    Result History       Results for Shannon, Joyce (MRN [id number]) as of 01/08/2020  10:45  Ref. Range 01/05/2020 06:35  Appearance Latest Ref Range: CLEAR  CLOUDY (A)  Bilirubin Urine Latest Ref Range: NEGATIVE  NEGATIVE  Color, Urine Latest Ref Range: YELLOW  YELLOW  Glucose, UA Latest Ref Range: NEGATIVE mg/dL NEGATIVE  Hgb urine dipstick Latest Ref Range: NEGATIVE  MODERATE (A)  Ketones, ur Latest Ref Range: NEGATIVE mg/dL 5 (A)  Leukocytes,Ua Latest Ref Range: NEGATIVE  LARGE (A)  Nitrite Latest Ref Range: NEGATIVE  NEGATIVE  pH Latest Ref Range: 5.0 - 8.0  6.0  Protein Latest Ref Range: NEGATIVE mg/dL 30 (A)  Specific Gravity, Urine Latest Ref Range: 1.005 - 1.030  1.013  Bacteria, UA Latest Ref Range: NONE SEEN  MANY (A)  RBC / HPF Latest Ref Range: 0 - 5 RBC/hpf 0-5  Squamous Epithelial / LPF Latest Ref Range: 0 - 5  0-5  WBC, UA Latest Ref Range: 0 - 5 WBC/hpf >50 (H)    Treatments: IV hydration, antibiotics: clindamycin, analgesia: acetaminophen, therapies: PT, OT, RN and SW and surgery: as above  Discharge Exam:                                Orthopaedic Trauma Service Progress Note   Patient ID: Shannon Joyce MRN: [id number] DOB/AGE: 25-Dec-1924 86 y.o.   Subjective:   Confused overnight Pulled off incisional vac Calm this am  Eating breakfast this am    No other issues  of note      ROS As above   Objective:    VITALS:         Vitals:    01/07/20 2140 01/08/20 0317 01/08/20 0741 01/08/20 0916  BP: 124/63 130/74 (!) 150/80 (!) 150/80  Pulse: (!) 101 91 92 92  Resp: 18 16 16     Temp: 99 F (37.2 C) 98.6 F (37 C) 98.1 F (36.7 C)    TempSrc: Oral   Oral    SpO2: 94% 96% 96%    Weight:          Height:              Estimated body mass index is 19.74 kg/m as calculated from the following:   Height as of this encounter: 5\' 4"  (1.626 m).   Weight as of this encounter: 52.2 kg.     Intake/Output      01/26 0701 - 01/27 0700 01/27 0701 - 01/28 0700   P.O. 240 120   I.V. (mL/kg)     Total Intake(mL/kg) 240 (4.6) 120 (2.3)   Urine (mL/kg/hr) 600 (0.5)    Total Output 600    Net -360 +120           LABS   Lab Results Last 24 Hours       Results for orders placed or performed during the hospital encounter of 01/04/20 (from the past 24 hour(s))  Comprehensive metabolic panel     Status: Abnormal    Collection Time: 01/08/20  2:27 AM  Result Value Ref Range    Sodium 142 135 - 145 mmol/L    Potassium 5.6 (H) 3.5 - 5.1 mmol/L    Chloride 115 (H) 98 - 111 mmol/L    CO2 22 22 - 32 mmol/L    Glucose, Bld 110 (H) 70 - 99 mg/dL    BUN 32 (H) 8 - 23 mg/dL    Creatinine, Ser 1.65 (H) 0.44 - 1.00 mg/dL    Calcium 7.9 (  L) 8.9 - 10.3 mg/dL    Total Protein 4.4 (L) 6.5 - 8.1 g/dL    Albumin 2.1 (L) 3.5 - 5.0 g/dL    AST 25 15 - 41 U/L    ALT 16 0 - 44 U/L    Alkaline Phosphatase 161 (H) 38 - 126 U/L    Total Bilirubin 1.0 0.3 - 1.2 mg/dL    GFR calc non Af Amer 27 (L) >60 mL/min    GFR calc Af Amer 31 (L) >60 mL/min    Anion gap 5 5 - 15      Visible to patient:  No (not released)  Next appt:  None Specimen Information: Urine, Random       Component 1 d ago  Specimen Description URINE, RANDOM   Special Requests NONE   Culture Abnormal  >=100,000 COLONIES/mL KLEBSIELLA PNEUMONIAE  IDENTIFICATION AND SUSCEPTIBILITIES TO FOLLOW    Performed at [facility], [address] [place], [place] [address]    Report Status PENDING   Resulting Agency [facility] CLIN LAB       Specimen Collected: 01/07/20 07:29  Last Resulted: 01/08/20 08:55      Lab Flowsheet    Order Details    View Encounter    Lab and Collection Details    Routing    Result History              PHYSICAL EXAM:    Gen: sitting up in bed, NAD, appears comfortable Lungs: unlabored Cardiac: regular, s1 and s2 Ext:       Left Upper Extremity              incisional vac removed             Splint and dressing disheveled but splint is still fitting well             Ace wrap has been removed from the left upper extremity.  Only the webril and splint remains in place              Extremities warm there is moderate swelling to the hand today but still less so than on admission             Moves digits without difficulty             Incision is clean, dry and intact.  No signs of infection no active drainage.  Overall incision looks great              New 4 x 4's applied to the incision and a new Ace wrap applied from hand to mid upper arm.  Tape was placed over the exposed 4 x 4's   Assessment/Plan: 4 Days Post-Op    Principal Problem:   Open left humeral fracture Active Problems:   AKI (acute kidney injury) ([place])   Open displaced spiral fracture of shaft of left humerus   Chronic kidney disease   Vitamin D insufficiency                Anti-infectives (From admission, onward)      Start     Dose/Rate Route Frequency Ordered Stop    01/05/20 0130   clindamycin (CLEOCIN) IVPB 600 mg     600 mg 100 mL/hr over 30 Minutes Intravenous Every 8 hours 01/05/20 0015 01/05/20 2159    01/05/20 0115   nitrofurantoin (MACRODANTIN) capsule 50 mg  Status:  Discontinued     50 mg Oral Daily at bedtime  01/05/20 0114 01/05/20 1448    01/04/20 1800   vancomycin (VANCOCIN) IVPB 1000 mg/200 mL premix     1,000 mg 200 mL/hr over 60 Minutes Intravenous  Once  01/04/20 1723 01/04/20 1853       .   POD/HD#: 50   86 year old female postop day 8 ORIF left humeral shaft s/p acute fall with open left humeral shaft fracture and hardware failure   -Open left humeral shaft fracture with retained hardware s/p I&D and revision              NWB L UEx             No active shoulder abduction              Ok to move fingers             Pt splinted to protect repair             Ideally dressing and splint should be maintained until office follow-up in 1 week.  Facility can reinforce dressing as needed if patient continues to pull at it and remove it.  Please contact the office for any questions or concerns             Continue with aggressive elevation to help with swelling control hand and digits above elbow and elbow above heart as much as possible.             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)                         Stabilized               Dementia                          Some confusion and agitation last night resulting in VAC removal                         Currently appears stable               Chronic UTI                         10^5 Klebsiella Pneumoniae                          On chronic nitrofurantoin, will follow up on sensitivities and pass along to ALF    - DVT/PE prophylaxis:             no pharmacologic treatment    - ID:             completed course of clinda   - Metabolic Bone Disease:             + vitamin d insufficiency              Supplement   - Activity:  PT/OT   - FEN/GI prophylaxis/Foley/Lines:             dysphagia 3 diet            Discontinue IV and IV fluids in preparation for discharge   - Impediments to fracture healing:             Open fracture              Seemingly increasing fall frequency             Vitamin D insufficiency             Dementia             Thyroid disease   - Dispo:             Discharged to assisted living facility today             Follow-up with orthopedics in 1 week       Disposition:  Discharge disposition: [place] Not Defined       Discharge Instructions    Call MD / Call 911   Complete by: As directed    If you experience chest pain or shortness of breath, CALL 911 and be transported to the hospital emergency room.  If you develope a fever above 101 F, pus (white drainage) or increased drainage or redness at the wound, or calf pain, call your surgeon's office.   Constipation Prevention   Complete by: As directed    Drink plenty of fluids.  Prune juice may be helpful.  You may use a stool softener, such as Colace (over the counter) 100 mg twice a day.  Use MiraLax (over the counter) for constipation as needed.   Diet general   Complete by: As directed    Discharge instructions   Complete by: As directed    Orthopaedic Trauma Service Discharge Instructions   General Discharge Instructions  Orthopaedic Injuries:  Left humeral shaft fracture.  Treated with open reduction internal fixation using plates and screws  WEIGHT BEARING STATUS: Nonweightbearing left upper extremity, no lifting with left arm.  Sling on at all times.  Do not remove splint  RANGE OF MOTION/ACTIVITY: Unrestricted range of motion of fingers.  Keep hand and fingers elevated above elbow and elbow elevated above heart as much as possible to help with swelling control.  Sling on at all times.  Bone health: Continue with vitamin D and calcium supplementation for low vitamin D and calcium levels  Wound Care: Keep splint on at all times.  Do not remove splint.  Reinforce dressings to the upper left arm as needed.  Please contact the office if there are any issues.  Diet: as you were eating previously.  Can use  over the counter stool softeners and bowel preparations, such as Miralax, to help with bowel movements.  Narcotics can be constipating.  Be sure to drink plenty of fluids  PAIN MEDICATION USE AND EXPECTATIONS  You have likely been given narcotic medications to help control your pain.  After a traumatic event that results in an fracture (broken bone) with or without surgery, it is ok to use narcotic pain medications to help control one's pain.  We understand that everyone responds to pain differently and each individual patient will be evaluated on a regular basis for the continued need for narcotic medications. Ideally, narcotic medication use should last no more than 6-8 weeks (coinciding with fracture healing).   As a patient  it is your responsibility as well to monitor narcotic medication use and report the amount and frequency you use these medications when you come to your office visit.   We would also advise that if you are using narcotic medications, you should take a dose prior to therapy to maximize you participation.  IF YOU ARE ON NARCOTIC MEDICATIONS IT IS NOT PERMISSIBLE TO OPERATE A MOTOR VEHICLE (MOTORCYCLE/CAR/TRUCK/MOPED) OR HEAVY MACHINERY DO NOT MIX NARCOTICS WITH OTHER CNS (CENTRAL NERVOUS SYSTEM) DEPRESSANTS SUCH AS ALCOHOL   STOP SMOKING OR USING NICOTINE PRODUCTS!!!!  As discussed nicotine severely impairs your body's ability to heal surgical and traumatic wounds but also impairs bone healing.  Wounds and bone heal by forming microscopic blood vessels (angiogenesis) and nicotine is a vasoconstrictor (essentially, shrinks blood vessels).  Therefore, if vasoconstriction occurs to these microscopic blood vessels they essentially disappear and are unable to deliver necessary nutrients to the healing tissue.  This is one modifiable factor that you can do to dramatically increase your chances of healing your injury.    (This means no smoking, no nicotine gum, patches, etc)  DO NOT USE  NONSTEROIDAL ANTI-INFLAMMATORY DRUGS (NSAID'S)  Using products such as Advil (ibuprofen), Aleve (naproxen), Motrin (ibuprofen) for additional pain control during fracture healing can delay and/or prevent the healing response.  If you would like to take over the counter (OTC) medication, Tylenol (acetaminophen) is ok.  However, some narcotic medications that are given for pain control contain acetaminophen as well. Therefore, you should not exceed more than 4000 mg of tylenol in a day if you do not have liver disease.  Also note that there are may OTC medicines, such as cold medicines and allergy medicines that my contain tylenol as well.  If you have any questions about medications and/or interactions please ask your doctor/PA or your pharmacist.      ICE AND ELEVATE INJURED/OPERATIVE EXTREMITY  Using ice and elevating the injured extremity above your heart can help with swelling and pain control.  Icing in a pulsatile fashion, such as 20 minutes on and 20 minutes off, can be followed.    Do not place ice directly on skin. Make sure there is a barrier between to skin and the ice pack.    Using frozen items such as frozen peas works well as the conform nicely to the are that needs to be iced.  USE AN ACE WRAP OR TED HOSE FOR SWELLING CONTROL  In addition to icing and elevation, Ace wraps or TED hose are used to help limit and resolve swelling.  It is recommended to use Ace wraps or TED hose until you are informed to stop.    When using Ace Wraps start the wrapping distally (farthest away from the body) and wrap proximally (closer to the body)   Example: If you had surgery on your leg or thing and you do not have a splint on, start the ace wrap at the toes and work your way up to the thigh        If you had surgery on your upper extremity and do not have a splint on, start the ace wrap at your fingers and work your way up to the upper arm  IF YOU ARE IN A SPLINT OR CAST DO NOT [place]   If your splint gets wet for any reason please contact the office immediately. You may shower in your splint or cast as long as you keep it dry.  This can be done by wrapping in a cast cover or garbage back (or similar)  Do Not stick any thing down your splint or cast such as pencils, money, or hangers to try and scratch yourself with.  If you feel itchy take benadryl as prescribed on the bottle for itching  IF YOU ARE IN A CAM BOOT (BLACK BOOT)  You may remove boot periodically. Perform daily dressing changes as noted below.  Wash the liner of the boot regularly and wear a sock when wearing the boot. It is recommended that you sleep in the boot until told otherwise    Call office for the following: Temperature greater than 101F Persistent nausea and vomiting Severe uncontrolled pain Redness, tenderness, or signs of infection (pain, swelling, redness, odor or green/yellow discharge around the site) Difficulty breathing, headache or visual disturbances Hives Persistent dizziness or light-headedness Extreme fatigue Any other questions or concerns you may have after discharge  In an emergency, call 911 or go to an Emergency Department at a nearby hospital    [place]: [phone]   VISIT OUR WEBSITE FOR ADDITIONAL INFORMATION: [email or web address]   Increase activity slowly as tolerated   Complete by: As directed    Lifting restrictions   Complete by: As directed    No lifting   Non weight bearing   Complete by: As directed    Laterality: left   Extremity: Upper     Allergies as of 01/08/2020      Reactions   Clinoril [sulindac] Other (See Comments)   Hives or swelling   Darvon [propoxyphene] Other (See Comments)   Hives or swelling   Levaquin [levofloxacin In D5w] Swelling   Meropenem Rash   Penicillins Swelling, Other (See Comments)   Did it involve swelling of the face/tongue/throat, SOB, or low BP? Yes Did it involve sudden  or severe rash/hives, skin peeling, or any reaction on the inside of your mouth or nose? Yes Did you need to seek medical attention at a hospital or doctor's office? Yes When did it last happen? If all above answers are "NO", may proceed with cephalosporin use.   Sulfa Antibiotics Other (See Comments)   Hives or swelling   Cefadroxil Other (See Comments)   unknown unknown   Gentamicin Other (See Comments)   unknown unknown      Medication List    STOP taking these medications   ibuprofen 200 MG tablet Commonly known as: ADVIL     TAKE these medications   acetaminophen 650 MG CR tablet Commonly known as: TYLENOL Take 650 mg by mouth every 8 (eight) hours as needed for pain.   ascorbic acid 500 MG tablet Commonly known as: VITAMIN C Take 1 tablet (500 mg total) by mouth daily. Start taking on: January 09, 2020   aspirin EC 81 MG tablet Take 81 mg by mouth daily. Until follow up   calcium citrate 950 (200 Ca) MG tablet Commonly known as: CALCITRATE - dosed in mg elemental calcium Take 1 tablet (200 mg of elemental calcium total) by mouth 2 (two) times daily.   Ensure Plus Liqd Take 237 mLs by mouth 3 (three) times daily between meals.   levothyroxine 25 MCG tablet Commonly known as: SYNTHROID Take 12.5 mcg by mouth daily.   metoprolol succinate 25 MG 24 hr tablet Commonly known as: TOPROL-XL Take 0.5 tablets (12.5 mg total) by mouth 2 (two) times daily.   mirtazapine 7.5 MG tablet Commonly known as: REMERON  Take 7.5 mg by mouth at bedtime.   nitrofurantoin 50 MG capsule Commonly known as: MACRODANTIN Take 50 mg by mouth at bedtime. Continuous course   omeprazole 20 MG capsule Commonly known as: PRILOSEC Take 20 mg by mouth daily.   senna 8.6 MG Tabs tablet Commonly known as: SENOKOT Take 1 tablet by mouth daily as needed for mild constipation.   Vitamin D3 25 MCG tablet Commonly known as: Vitamin D Take 2 tablets (2,000 Units total) by mouth 2  (two) times daily.            Discharge Care Instructions  (From admission, onward)         Start     Ordered   01/08/20 0000  Non weight bearing    Question Answer Comment  Laterality left   Extremity Upper      01/08/20 1108         Follow-up Information    Altamese Bourbonnais, MD. Schedule an appointment as soon as possible for a visit in 7 day(s).   Specialty: Orthopedic Surgery Contact information: [place] [address] [phone]           Discharge Instructions and Plan:  Patient has sustained a series of complex injuries to her left upper extremity.  Fixation in her severely osteoporotic bone is a little tenuous following her subsequent fall and having the original hardware pullout of the bone.  We are hopeful that there is enough fixation to allow for her to go on to adequately unite.  We will likely be fairly conservative with initiating motion with her and will likely place her into a long-arm cast at her first follow-up visit to further restrict motion.  Would anticipate a protracted healing course 8 to 12 weeks.  Will check the patient back in the office in 1 week for wound check and possible suture removal at that time.  Assisted living facility can reinforce dressings as needed.  Please keep the Ace wrap on her arm to help with swelling control.  Would also continue to elevate her hand and wrist above her elbow and her elbow above her heart is much as possible to help with swelling control as well.  Sling is to be on at all times except for when in bed and in chair.  Please contact the office with any questions or concerns.  Signed:  Jari Pigg, PA-C [phone] (C) 01/08/2020, 11:10 AM  [facility] [place] [address] [phone] (F)

## 2020-01-08 NOTE — Progress Notes (Signed)
Occupational Therapy Treatment Patient Details Name: Shannon Joyce MRN: [id number] DOB: [birth year] Today's Date: 01/08/2020    History of present illness Patient is a 86 y/o female presenting to the ED on 01/04/20 with open left humeral shaft fracture. Of note, patient admitted last week with left humerus fracture that was treated with ORIF on 12/27/19. Admitted currently for s/p acute fall with open left humeral shaft fracture and hardware failure. REVISION OPEN REDUCTION INTERNAL FIXATION (ORIF) LEFT HUMERAL SHAFT FRACTURE (Left) AND INFUSE ALLOGRAFTING on 01/04/20.  PMH HTN.   OT comments  Pt seen for LUE HEP for digit ROM mostly as pt not tolerating any movement at shoulder. Pt is requiring extensive assist for ADLs and mobility due limitations from falls, pain and poor activity tolerance. Pt tolerating very minimal movement in LUE; pain with digit ROM. Pt would benefit from continued OT skilled services. OT following acutely.    Follow Up Recommendations  SNF;Supervision/Assistance - 24 hour(if ALF can assist pt for total care, then pt can return)    Equipment Recommendations  None recommended by OT    Recommendations for Other Services      Precautions / Restrictions Precautions Precautions: Fall Precaution Comments: sling for comfort Restrictions Weight Bearing Restrictions: Yes LUE Weight Bearing: Non weight bearing       Mobility Bed Mobility Overal bed mobility: Needs Assistance                Transfers                      Balance                                           ADL either performed or assessed with clinical judgement   ADL Overall ADL's : Needs assistance/impaired                                     Functional mobility during ADLs: Total assistance;+2 for physical assistance;+2 for safety/equipment General ADL Comments: Pt is requiring extensive assist for ADLs and mobility due limitations from falls,  pain and poor activity tolerance.     Vision       Perception     Praxis      Cognition Arousal/Alertness: Awake/alert Behavior During Therapy: Flat affect Overall Cognitive Status: No family/caregiver present to determine baseline cognitive functioning Area of Impairment: Following commands;Safety/judgement;Problem solving                       Following Commands: Follows one step commands consistently;Follows one step commands with increased time Safety/Judgement: Decreased awareness of safety;Decreased awareness of deficits   Problem Solving: Decreased initiation;Difficulty sequencing;Requires verbal cues;Requires tactile cues          Exercises Exercises: Other exercises Other Exercises Other Exercises: PROM to L hand/digits flex/ext   Shoulder Instructions       General Comments Pt tolerating very minimal movement in LUE; pain with digit ROM.    Pertinent Vitals/ Pain       Pain Assessment: Faces Faces Pain Scale: Hurts even more Pain Location: LUE Pain Descriptors / Indicators: Grimacing;Guarding Pain Intervention(s): Monitored during session;Repositioned;Premedicated before session  Home Living  Prior Functioning/Environment              Frequency  Min 2X/week        Progress Toward Goals  OT Goals(current goals can now be found in the care plan section)  Progress towards OT goals: Progressing toward goals  Acute Rehab OT Goals Patient Stated Goal: none stated OT Goal Formulation: Patient unable to participate in goal setting Time For Goal Achievement: 01/19/20 Potential to Achieve Goals: Fair ADL Goals Pt Will Perform Grooming: with set-up Pt Will Transfer to Toilet: with min assist;squat pivot transfer;bedside commode Pt/caregiver will Perform Home Exercise Program: Increased ROM;Increased strength;Left upper extremity;With written HEP provided;With minimal  assist Additional ADL Goal #1: Pt will be S in and OOB for basic ADLs  Plan Discharge plan remains appropriate    Co-evaluation                 AM-PAC OT "6 Clicks" Daily Activity     Outcome Measure   Help from another person eating meals?: A Little Help from another person taking care of personal grooming?: A Lot Help from another person toileting, which includes using toliet, bedpan, or urinal?: Total Help from another person bathing (including washing, rinsing, drying)?: Total Help from another person to put on and taking off regular upper body clothing?: A Lot Help from another person to put on and taking off regular lower body clothing?: Total 6 Click Score: 10    End of Session    OT Visit Diagnosis: Unsteadiness on feet (R26.81);Other abnormalities of gait and mobility (R26.89);Repeated falls (R29.6);History of falling (Z91.81);Pain;Other symptoms and signs involving cognitive function Pain - Right/Left: Left Pain - part of body: Arm   Activity Tolerance Patient tolerated treatment well   Patient Left in bed;with call bell/phone within reach;with bed alarm set   Nurse Communication Mobility status        Time: [id number] OT Time Calculation (min): 14 min  Charges: OT General Charges $OT Visit: 1 Visit OT Treatments $Therapeutic Exercise: 8-22 mins  Jefferey Pica OTR/L Acute Rehabilitation Services Pager: [phone] Office: [phone]    Shannon Joyce 01/08/2020, 4:14 PM

## 2020-01-08 NOTE — Progress Notes (Signed)
Report given to [place] at the [facility].  A discharge packet has been printed and will be sent via [facility].  The patient's son is aware of transport.

## 2020-01-08 NOTE — TOC Transition Note (Signed)
Transition of Care [facility]) - CM/SW Discharge Note   Patient Details  Name: Shannon Joyce MRN: [id number] Date of Birth: 25-Aug-1925  Transition of Care [facility]) CM/SW Contact:  Atilano Median, LCSW Phone Number: 01/08/2020, 3:28 PM   Clinical Narrative:     Discharged back to [facility]. Return coordinated with [place]. Patient's son Clance Boll aware and agreeable to this plan. Number to call report [phone] given to unit RN Caren Griffins.   DC summary and signed FL2 sent via fax to [phone]  No other needs at this time. Case closed to this CSW.  Final next level of care: Assisted Living Barriers to Discharge: Barriers Resolved   Patient Goals and CMS Choice        Discharge Placement              Patient chooses bed at: Other - please specify in the comment section below:([facility]) Patient to be transferred to facility by: [facility] Name of family member notified: Clance Boll Patient and family notified of of transfer: 01/07/20  Discharge Plan and Services                                     Social Determinants of Health (SDOH) Interventions     Readmission Risk Interventions No flowsheet data found.

## 2020-01-08 NOTE — Progress Notes (Signed)
Pt confused overnight. Pt removed wound vac from shoulder. Surgical site clean, dry , intact. Sling in place.

## 2020-01-08 NOTE — Discharge Instructions (Signed)
[facility]edic [facility]uma [facility] Discharge Instructions   General Discharge Instructions  Orthopaedic Injuries:  Left humeral shaft fracture.  Treated with open reduction internal fixation using plates and screws  WEIGHT BEARING STATUS: Nonweightbearing left upper extremity, no lifting with left arm.  Sling on at all times.  Do not remove splint  RANGE OF MOTION/ACTIVITY: Unrestricted range of motion of fingers.  Keep hand and fingers elevated above elbow and elbow elevated above heart as much as possible to help with swelling control.  Sling on at all times.  Bone health: Continue with vitamin D and calcium supplementation for low vitamin D and calcium levels  Wound Care: Keep splint on at all times.  Do not remove splint.  Reinforce dressings to the upper left arm as needed.  Please contact the office if there are any issues.  Diet: as you were eating previously.  Can use over the counter stool softeners and bowel preparations, such as Miralax, to help with bowel movements.  Narcotics can be constipating.  Be sure to drink plenty of fluids  PAIN MEDICATION USE AND EXPECTATIONS  You have likely been given narcotic medications to help control your pain.  After a traumatic event that results in an fracture (broken bone) with or without surgery, it is ok to use narcotic pain medications to help control one's pain.  We understand that everyone responds to pain differently and each individual patient will be evaluated on a regular basis for the continued need for narcotic medications. Ideally, narcotic medication use should last no more than 6-8 weeks (coinciding with fracture healing).   As a patient it is your responsibility as well to monitor narcotic medication use and report the amount and frequency you use these medications when you come to your office visit.   We would also advise that if you are using narcotic medications, you should take a dose prior to therapy to maximize you  participation.  IF YOU ARE ON NARCOTIC MEDICATIONS IT IS NOT PERMISSIBLE TO OPERATE A MOTOR VEHICLE (MOTORCYCLE/CAR/TRUCK/MOPED) OR HEAVY MACHINERY DO NOT MIX NARCOTICS WITH OTHER CNS (CENTRAL NERVOUS SYSTEM) DEPRESSANTS SUCH AS ALCOHOL   STOP SMOKING OR USING NICOTINE PRODUCTS!!!!  As discussed nicotine severely impairs your body's ability to heal surgical and traumatic wounds but also impairs bone healing.  Wounds and bone heal by forming microscopic blood vessels (angiogenesis) and nicotine is a vasoconstrictor (essentially, shrinks blood vessels).  Therefore, if vasoconstriction occurs to these microscopic blood vessels they essentially disappear and are unable to deliver necessary nutrients to the healing tissue.  This is one modifiable factor that you can do to dramatically increase your chances of healing your injury.    (This means no smoking, no nicotine gum, patches, etc)  DO NOT USE NONSTEROIDAL ANTI-INFLAMMATORY DRUGS (NSAID'S)  Using products such as Advil (ibuprofen), Aleve (naproxen), Motrin (ibuprofen) for additional pain control during fracture healing can delay and/or prevent the healing response.  If you would like to take over the counter (OTC) medication, Tylenol (acetaminophen) is ok.  However, some narcotic medications that are given for pain control contain acetaminophen as well. Therefore, you should not exceed more than 4000 mg of tylenol in a day if you do not have liver disease.  Also note that there are may OTC medicines, such as cold medicines and allergy medicines that my contain tylenol as well.  If you have any questions about medications and/or interactions please ask your doctor/PA or your pharmacist.      ICE AND ELEVATE  INJURED/OPERATIVE EXTREMITY  Using ice and elevating the injured extremity above your heart can help with swelling and pain control.  Icing in a pulsatile fashion, such as 20 minutes on and 20 minutes off, can be followed.    Do not place ice  directly on skin. Make sure there is a barrier between to skin and the ice pack.    Using frozen items such as frozen peas works well as the conform nicely to the are that needs to be iced.  USE AN ACE WRAP OR TED HOSE FOR SWELLING CONTROL  In addition to icing and elevation, Ace wraps or TED hose are used to help limit and resolve swelling.  It is recommended to use Ace wraps or TED hose until you are informed to stop.    When using Ace Wraps start the wrapping distally (farthest away from the body) and wrap proximally (closer to the body)   Example: If you had surgery on your leg or thing and you do not have a splint on, start the ace wrap at the toes and work your way up to the thigh        If you had surgery on your upper extremity and do not have a splint on, start the ace wrap at your fingers and work your way up to the upper arm  IF YOU ARE IN A SPLINT OR CAST DO NOT [place]   If your splint gets wet for any reason please contact the office immediately. You may shower in your splint or cast as long as you keep it dry.  This can be done by wrapping in a cast cover or garbage back (or similar)  Do Not stick any thing down your splint or cast such as pencils, money, or hangers to try and scratch yourself with.  If you feel itchy take benadryl as prescribed on the bottle for itching  IF YOU ARE IN A CAM BOOT (BLACK BOOT)  You may remove boot periodically. Perform daily dressing changes as noted below.  Wash the liner of the boot regularly and wear a sock when wearing the boot. It is recommended that you sleep in the boot until told otherwise    Call office for the following:  Temperature greater than 101F  Persistent nausea and vomiting  Severe uncontrolled pain  Redness, tenderness, or signs of infection (pain, swelling, redness, odor or green/yellow discharge around the site)  Difficulty breathing, headache or visual disturbances  Hives  Persistent dizziness or  light-headedness  Extreme fatigue  Any other questions or concerns you may have after discharge  In an emergency, call 911 or go to an Emergency Department at a nearby hospital    [place]: [phone]   VISIT OUR WEBSITE FOR ADDITIONAL INFORMATION: [email or web address]

## 2020-01-09 LAB — URINE CULTURE: Culture: >=100000 — AB

## 2020-01-10 ENCOUNTER — Non-Acute Institutional Stay: Payer: Medicare Other | Admitting: Hospice

## 2020-01-10 ENCOUNTER — Other Ambulatory Visit: Payer: Self-pay

## 2020-01-10 DIAGNOSIS — Z515 Encounter for palliative care: Secondary | ICD-10-CM

## 2020-01-10 DIAGNOSIS — S42302A Unspecified fracture of shaft of humerus, left arm, initial encounter for closed fracture: Secondary | ICD-10-CM

## 2020-01-10 DIAGNOSIS — F0390 Unspecified dementia without behavioral disturbance: Secondary | ICD-10-CM

## 2020-01-10 NOTE — Progress Notes (Signed)
[facility] Consult Note Telephone: [phone]  Fax: [phone]  PATIENT NAME: Shannon Joyce DOB: 30-Aug-1925 MRN: [id number]  PRIMARY CARE PROVIDER:  Dr Reymundo Poll REFERRING PROVIDER:  No referring provider defined for this encounter.  RESPONSIBLE PARTY:   SonClance Boll [phone]    RECOMMENDATIONS and PLAN:  Patient with significant decline - worsening memory loss/confusion. Dementia 7c. Patient with recent fall and admission to hospital for ORIF L humerus 12/27/2019. Unfortunately following another unwitnessed fall at the facility the distal aspect of her humerus was protruding through her incision. She went in again for surgical repair 01/04/2020- original hardware was removed and new hardware was placed after thorough irrigation debridement. Patient continues to decline in cognitive and functional status, Facility, family and PCP on board for Hospice services. Hospice Referral sent in. Patient is a DNR, comfort care only. Patient has Haldol for agitation  And Tramadol for pain. During visit, she was being fed, FLACC 0.  I spent 60 minutes providing this consultation; time includes chart review and documentation. More than 50% of the time in this consultation was spent coordinating communication.    HISTORY OF PRESENT ILLNESS:Shannon Raganis a 87 y.o.year oldfemalewith multiple medical problems includingHTN, right femur fracture (01/19), chronic pain, Dementia, HTN, GERD and recurrent UTI's. Palliative Care was asked to help address goals of care.   CODE STATUS: DNR  PPS: 30% HOSPICE ELIGIBILITY/DIAGNOSIS: TBD  PAST MEDICAL HISTORY:  Past Medical History:  Diagnosis Date  . Chronic kidney disease   . Chronic UTI 01/08/2020  . Dementia ([place])   . Dementia ([place]) 01/08/2020  . Hypertension   . UTI (lower urinary tract infection)   . Vitamin D insufficiency 01/07/2020    SOCIAL HX:  Social History   Tobacco Use  . Smoking  status: Never Smoker  . Smokeless tobacco: Never Used  Substance Use Topics  . Alcohol use: No    ALLERGIES:  Allergies  Allergen Reactions  . Clinoril [Sulindac] Other (See Comments)    Hives or swelling  . Darvon [Propoxyphene] Other (See Comments)    Hives or swelling  . Levaquin [Levofloxacin In D5w] Swelling  . Meropenem Rash  . Penicillins Swelling and Other (See Comments)    Did it involve swelling of the face/tongue/throat, SOB, or low BP? Yes Did it involve sudden or severe rash/hives, skin peeling, or any reaction on the inside of your mouth or nose? Yes Did you need to seek medical attention at a hospital or doctor's office? Yes When did it last happen? If all above answers are "NO", may proceed with cephalosporin use.  . Sulfa Antibiotics Other (See Comments)    Hives or swelling  . Cefadroxil Other (See Comments)    unknown unknown   . Gentamicin Other (See Comments)    unknown unknown      PERTINENT MEDICATIONS:  Outpatient Encounter Medications as of 01/10/2020  Medication Sig  . acetaminophen (TYLENOL) 650 MG CR tablet Take 650 mg by mouth every 8 (eight) hours as needed for pain.  Marland Kitchen ascorbic acid (VITAMIN C) 500 MG tablet Take 1 tablet (500 mg total) by mouth daily.  Marland Kitchen aspirin EC 81 MG tablet Take 81 mg by mouth daily. Until follow up  . calcium citrate (CALCITRATE - DOSED IN MG ELEMENTAL CALCIUM) 950 (200 Ca) MG tablet Take 1 tablet (200 mg of elemental calcium total) by mouth 2 (two) times daily.  . cholecalciferol (VITAMIN D) 25 MCG tablet Take 2  tablets (2,000 Units total) by mouth 2 (two) times daily.  . Ensure Plus (ENSURE PLUS) LIQD Take 237 mLs by mouth 3 (three) times daily between meals.  Marland Kitchen levothyroxine (SYNTHROID) 25 MCG tablet Take 12.5 mcg by mouth daily.  . metoprolol succinate (TOPROL-XL) 25 MG 24 hr tablet Take 0.5 tablets (12.5 mg total) by mouth 2 (two) times daily.  . mirtazapine (REMERON) 7.5 MG tablet Take 7.5 mg by mouth at  bedtime.  . nitrofurantoin (MACRODANTIN) 50 MG capsule Take 50 mg by mouth at bedtime. Continuous course  . omeprazole (PRILOSEC) 20 MG capsule Take 20 mg by mouth daily.  Marland Kitchen senna (SENOKOT) 8.6 MG TABS tablet Take 1 tablet by mouth daily as needed for mild constipation.   No facility-administered encounter medications on file as of 01/10/2020.    PHYSICAL EXAM:   General: In no acute distress Cardiovascular: denied chest pain/discomfort Pulmonary: normal respiratory effort, no coughing GU: no suprapubic tenderness Extremities: Incision to Left upper arm dry and intact; swelling in Left arm noted. Encouraged elevation and use of sling as ordered. Skin: no rashes to exposed skin.   Neurological: Weakness but otherwise nonfocal, confused  Teodoro Spray, NP

## 2020-01-14 ENCOUNTER — Inpatient Hospital Stay (HOSPITAL_COMMUNITY)
Admission: EM | Admit: 2020-01-14 | Discharge: 2020-01-21 | DRG: 493 | Disposition: A | Discharge status: Skilled Nursing Facility | Payer: Medicare Other | Source: Skilled Nursing Facility | Attending: Orthopedic Surgery | Admitting: Orthopedic Surgery

## 2020-01-14 ENCOUNTER — Emergency Department (HOSPITAL_COMMUNITY): Payer: Medicare Other

## 2020-01-14 ENCOUNTER — Emergency Department (HOSPITAL_BASED_OUTPATIENT_CLINIC_OR_DEPARTMENT_OTHER): Payer: Medicare Other

## 2020-01-14 ENCOUNTER — Other Ambulatory Visit: Payer: Self-pay

## 2020-01-14 ENCOUNTER — Encounter (HOSPITAL_COMMUNITY): Payer: Self-pay | Admitting: Emergency Medicine

## 2020-01-14 ENCOUNTER — Inpatient Hospital Stay: Payer: Self-pay

## 2020-01-14 DIAGNOSIS — N1832 Chronic kidney disease, stage 3b: Secondary | ICD-10-CM | POA: Diagnosis present

## 2020-01-14 DIAGNOSIS — M84422A Pathological fracture, left humerus, initial encounter for fracture: Secondary | ICD-10-CM | POA: Diagnosis present

## 2020-01-14 DIAGNOSIS — F0390 Unspecified dementia without behavioral disturbance: Secondary | ICD-10-CM | POA: Diagnosis present

## 2020-01-14 DIAGNOSIS — T84121A Displacement of internal fixation device of left humerus, initial encounter: Secondary | ICD-10-CM | POA: Diagnosis present

## 2020-01-14 DIAGNOSIS — Y792 Prosthetic and other implants, materials and accessory orthopedic devices associated with adverse incidents: Secondary | ICD-10-CM | POA: Diagnosis present

## 2020-01-14 DIAGNOSIS — N390 Urinary tract infection, site not specified: Secondary | ICD-10-CM | POA: Diagnosis present

## 2020-01-14 DIAGNOSIS — D62 Acute posthemorrhagic anemia: Secondary | ICD-10-CM | POA: Diagnosis not present

## 2020-01-14 DIAGNOSIS — Z885 Allergy status to narcotic agent status: Secondary | ICD-10-CM | POA: Diagnosis not present

## 2020-01-14 DIAGNOSIS — Z20822 Contact with and (suspected) exposure to covid-19: Secondary | ICD-10-CM | POA: Diagnosis present

## 2020-01-14 DIAGNOSIS — Z9181 History of falling: Secondary | ICD-10-CM

## 2020-01-14 DIAGNOSIS — R52 Pain, unspecified: Secondary | ICD-10-CM

## 2020-01-14 DIAGNOSIS — Z681 Body mass index (BMI) 19 or less, adult: Secondary | ICD-10-CM

## 2020-01-14 DIAGNOSIS — Z7982 Long term (current) use of aspirin: Secondary | ICD-10-CM | POA: Diagnosis not present

## 2020-01-14 DIAGNOSIS — N189 Chronic kidney disease, unspecified: Secondary | ICD-10-CM | POA: Diagnosis present

## 2020-01-14 DIAGNOSIS — Z90710 Acquired absence of both cervix and uterus: Secondary | ICD-10-CM | POA: Diagnosis not present

## 2020-01-14 DIAGNOSIS — Z79899 Other long term (current) drug therapy: Secondary | ICD-10-CM | POA: Diagnosis not present

## 2020-01-14 DIAGNOSIS — Z880 Allergy status to penicillin: Secondary | ICD-10-CM | POA: Diagnosis not present

## 2020-01-14 DIAGNOSIS — B957 Other staphylococcus as the cause of diseases classified elsewhere: Secondary | ICD-10-CM | POA: Diagnosis present

## 2020-01-14 DIAGNOSIS — Z881 Allergy status to other antibiotic agents status: Secondary | ICD-10-CM

## 2020-01-14 DIAGNOSIS — T148XXA Other injury of unspecified body region, initial encounter: Secondary | ICD-10-CM

## 2020-01-14 DIAGNOSIS — Z79890 Hormone replacement therapy (postmenopausal): Secondary | ICD-10-CM

## 2020-01-14 DIAGNOSIS — M7989 Other specified soft tissue disorders: Secondary | ICD-10-CM

## 2020-01-14 DIAGNOSIS — M80029A Age-related osteoporosis with current pathological fracture, unspecified humerus, initial encounter for fracture: Secondary | ICD-10-CM | POA: Diagnosis present

## 2020-01-14 DIAGNOSIS — Z87440 Personal history of urinary (tract) infections: Secondary | ICD-10-CM | POA: Diagnosis not present

## 2020-01-14 DIAGNOSIS — E559 Vitamin D deficiency, unspecified: Secondary | ICD-10-CM | POA: Diagnosis present

## 2020-01-14 DIAGNOSIS — S42302A Unspecified fracture of shaft of humerus, left arm, initial encounter for closed fracture: Secondary | ICD-10-CM | POA: Diagnosis present

## 2020-01-14 DIAGNOSIS — I129 Hypertensive chronic kidney disease with stage 1 through stage 4 chronic kidney disease, or unspecified chronic kidney disease: Secondary | ICD-10-CM | POA: Diagnosis present

## 2020-01-14 DIAGNOSIS — S42392A Other fracture of shaft of left humerus, initial encounter for closed fracture: Secondary | ICD-10-CM

## 2020-01-14 DIAGNOSIS — Z419 Encounter for procedure for purposes other than remedying health state, unspecified: Secondary | ICD-10-CM

## 2020-01-14 LAB — CBC WITH DIFFERENTIAL/PLATELET
Abs Immature Granulocytes: 0.12 10*3/uL — ABNORMAL HIGH (ref 0.00–0.07)
Basophils Absolute: 0.1 10*3/uL (ref 0.0–0.1)
Basophils Relative: 0 %
Eosinophils Absolute: 0.3 10*3/uL (ref 0.0–0.5)
Eosinophils Relative: 2 %
HCT: 37.8 % (ref 36.0–46.0)
Hemoglobin: 11.6 g/dL — ABNORMAL LOW (ref 12.0–15.0)
Immature Granulocytes: 1 %
Lymphocytes Relative: 11 %
Lymphs Abs: 1.6 10*3/uL (ref 0.7–4.0)
MCH: 33.3 pg (ref 26.0–34.0)
MCHC: 30.7 g/dL (ref 30.0–36.0)
MCV: 108.6 fL — ABNORMAL HIGH (ref 80.0–100.0)
Monocytes Absolute: 1.3 10*3/uL — ABNORMAL HIGH (ref 0.1–1.0)
Monocytes Relative: 10 %
Neutro Abs: 10.5 10*3/uL — ABNORMAL HIGH (ref 1.7–7.7)
Neutrophils Relative %: 76 %
Platelets: 165 10*3/uL (ref 150–400)
RBC: 3.48 MIL/uL — ABNORMAL LOW (ref 3.87–5.11)
RDW: 17.8 % — ABNORMAL HIGH (ref 11.5–15.5)
WBC: 13.9 10*3/uL — ABNORMAL HIGH (ref 4.0–10.5)
nRBC: 0 % (ref 0.0–0.2)

## 2020-01-14 LAB — URINALYSIS, ROUTINE W REFLEX MICROSCOPIC
Bilirubin Urine: NEGATIVE
Glucose, UA: NEGATIVE mg/dL
Hgb urine dipstick: NEGATIVE
Ketones, ur: NEGATIVE mg/dL
Nitrite: POSITIVE — AB
Protein, ur: 30 mg/dL — AB
Specific Gravity, Urine: 1.013 (ref 1.005–1.030)
pH: 7 (ref 5.0–8.0)

## 2020-01-14 LAB — BASIC METABOLIC PANEL
Anion gap: 8 (ref 5–15)
BUN: 24 mg/dL — ABNORMAL HIGH (ref 8–23)
CO2: 26 mmol/L (ref 22–32)
Calcium: 8.6 mg/dL — ABNORMAL LOW (ref 8.9–10.3)
Chloride: 105 mmol/L (ref 98–111)
Creatinine, Ser: 1.44 mg/dL — ABNORMAL HIGH (ref 0.44–1.00)
GFR calc Af Amer: 36 mL/min — ABNORMAL LOW (ref >60)
GFR calc non Af Amer: 31 mL/min — ABNORMAL LOW (ref >60)
Glucose, Bld: 157 mg/dL — ABNORMAL HIGH (ref 70–99)
Potassium: 4 mmol/L (ref 3.5–5.1)
Sodium: 139 mmol/L (ref 135–145)

## 2020-01-14 LAB — SARS CORONAVIRUS 2 (TAT 6-24 HRS): SARS Coronavirus 2: NEGATIVE

## 2020-01-14 MED ORDER — POTASSIUM CHLORIDE IN NACL 20-0.9 MEQ/L-% IV SOLN
INTRAVENOUS | Status: DC
  Filled 2020-01-14 (×4): qty 1000

## 2020-01-14 MED ORDER — SODIUM CHLORIDE 0.9% FLUSH: 10.0000 mL | INTRAVENOUS | Status: DC | PRN

## 2020-01-14 MED ORDER — ACETAMINOPHEN 500 MG PO TABS
500.0000 mg | ORAL_TABLET | Freq: Three times a day (TID) | ORAL | Status: DC
  Administered 2020-01-15 – 2020-01-17 (×6): 500 mg via ORAL
  Filled 2020-01-14 (×5): qty 1

## 2020-01-14 MED ORDER — HYDROMORPHONE HCL 1 MG/ML IJ SOLN
0.5000 mg | Freq: Once | INTRAMUSCULAR | Status: AC | PRN
  Administered 2020-01-14: 16:00:00 0.5 mg via INTRAMUSCULAR
  Filled 2020-01-14: qty 1

## 2020-01-14 MED ORDER — OXYCODONE HCL 5 MG PO TABS
5.0000 mg | ORAL_TABLET | Freq: Once | ORAL | Status: AC
  Administered 2020-01-14: 5 mg via ORAL

## 2020-01-14 MED ORDER — CHLORHEXIDINE GLUCONATE CLOTH 2 % EX PADS
6.0000 | MEDICATED_PAD | Freq: Every day | CUTANEOUS | Status: DC
  Administered 2020-01-15 – 2020-01-21 (×6): 6 via TOPICAL

## 2020-01-14 MED ORDER — HALOPERIDOL 0.5 MG PO TABS
0.5000 mg | ORAL_TABLET | Freq: Four times a day (QID) | ORAL | Status: DC | PRN
  Administered 2020-01-15: 22:00:00 0.5 mg via ORAL
  Filled 2020-01-14 (×2): qty 1

## 2020-01-14 MED ORDER — ACETAMINOPHEN 650 MG RE SUPP: 650.0000 mg | Freq: Three times a day (TID) | RECTAL | Status: DC

## 2020-01-14 MED ORDER — OXYCODONE HCL 5 MG PO TABS
2.5000 mg | ORAL_TABLET | ORAL | Status: DC | PRN
  Administered 2020-01-14 – 2020-01-17 (×4): 2.5 mg via ORAL
  Filled 2020-01-14 (×5): qty 1

## 2020-01-14 NOTE — ED Triage Notes (Signed)
Pt here via [facility] from [facility] for L shoulder pain and L arm edema. Pt had recent fx of L humerus w/ surgery, pt has had increased pain and edema in arm since. Per staff pt has hx of dementia but has become more altered since surgery. VSS.

## 2020-01-14 NOTE — Progress Notes (Signed)
Upper extremity venous has been completed.   Preliminary results in CV Proc.   Shannon Joyce 01/14/2020 3:22 PM

## 2020-01-14 NOTE — ED Notes (Addendum)
Pt transported to xray 

## 2020-01-14 NOTE — H&P (Addendum)
Orthopaedic Trauma Service (OTS) Consult   Patient ID: Shannon Joyce MRN: [id number] DOB/AGE: [birth year] 86 y.o.    HPI: Shannon Joyce is an 86 y.o. white female whose ordeal started December 27, 2019 when she was admitted for ORIF of her left humerus.  This admission was complicated with acute delirium on top of her baseline dementia as well as acute on chronic renal insufficiency.  She was subsequently discharged back to her assisted living facility and presented back to [facility] emergency department on 01/04/2020 after sustaining another fall resulting in hardware failure and open fracture of her left humerus.  Patient was admitted to orthopedic service at that time she did very well during that admission and was subsequently discharged back to her assisted living facility this time in a long-arm splint with strict instructions not to remove the splint.  Patient presents today at the behest of the assisted living facility due to increased swelling and pain.  X-rays show once again failure of her hardware in the left distal humerus.  It is unclear as to whether or not the patient's splint was removed at her assisted living facility or if it was removed here in the emergency department.  Patient's son does not know whether or not the splint was removed at the facility either.  They made no mention of this when he was talking to the assisted living facility today.  I did communicate with the ED PA who states that the splint had already been removed upon her evaluation.  Patient is unable to provide any reliable information due to her dementia.  Is also unclear as to whether or not she sustained another fall patient has already had a Doppler ultrasound of the left upper extremity which is negative for acute DVT.  Patient was actually supposed to follow-up with [place] tomorrow in the office for her first outpatient follow-up. EMS run sheet does not note presence or absence of splint at the time of pick  up   Patient was seen and evaluated in the hallway bed 15.  She is confused but does appear to be at baseline she does not recall having any falls  No other information provided  Follow-up on her urine culture from discharge last admission shows multi resistant Klebsiella.  Treatment is further complicated by multiple drug allergies.  Patient on chronic nitrofurantoin.  Klebsiella is resistant to this  Past Medical History:  Diagnosis Date  . Chronic kidney disease   . Chronic UTI 01/08/2020  . Dementia ([place])   . Dementia ([place]) 01/08/2020  . Hypertension   . UTI (lower urinary tract infection)   . Vitamin D insufficiency 01/07/2020    Past Surgical History:  Procedure Laterality Date  . ABDOMINAL HYSTERECTOMY    . CHOLECYSTECTOMY    . ORIF HUMERUS FRACTURE Left 12/27/2019   Procedure: OPEN REDUCTION INTERNAL FIXATION (ORIF) HUMERAL SHAFT FRACTURE;  Surgeon: Hiram Gash, MD;  Location: [place];  Service: Orthopedics;  Laterality: Left;  . ORIF HUMERUS FRACTURE Left 01/04/2020   Procedure: REMOVAL HARDWARE, OPEN REDUCTION INTERNAL FIXATION (ORIF) HUMERAL SHAFT FRACTURE;  Surgeon: Altamese Monroe North, MD;  Location: [place];  Service: Orthopedics;  Laterality: Left;  . ORTHOPEDIC SURGERY      History reviewed. No pertinent family history.  Social History:  reports that she has never smoked. She has never used smokeless tobacco. She reports that she does not drink alcohol or use drugs.  Allergies:  Allergies  Allergen Reactions  .  Clinoril [Sulindac] Other (See Comments)    Hives or swelling  . Darvon [Propoxyphene] Other (See Comments)    Hives or swelling  . Levaquin [Levofloxacin In D5w] Swelling  . Meropenem Rash  . Penicillins Swelling and Other (See Comments)    Did it involve swelling of the face/tongue/throat, SOB, or low BP? Yes Did it involve sudden or severe rash/hives, skin peeling, or any reaction on the inside of your mouth or nose? Yes Did you need to seek medical  attention at a hospital or doctor's office? Yes When did it last happen? If all above answers are "NO", may proceed with cephalosporin use.  . Sulfa Antibiotics Other (See Comments)    Hives or swelling  . Cefadroxil Other (See Comments)    unknown unknown   . Gentamicin Other (See Comments)    unknown unknown     Medications: I have reviewed the patient's current medications. Current Outpatient Medications  Medication Instructions  . acetaminophen (TYLENOL) 650 mg, Oral, Every 8 hours PRN  . ascorbic acid (VITAMIN C) 500 mg, Oral, Daily  . aspirin EC 81 mg, Oral, Daily, Until follow up  . calcium citrate (CALCITRATE - DOSED IN MG ELEMENTAL CALCIUM) 950 (200 Ca) MG tablet 200 mg of elemental calcium, Oral, 2 times daily  . Ensure Plus (ENSURE PLUS) LIQD 237 mLs, Oral, 3 times daily between meals  . levothyroxine (SYNTHROID) 12.5 mcg, Oral, Daily  . metoprolol succinate (TOPROL-XL) 12.5 mg, Oral, 2 times daily  . mirtazapine (REMERON) 7.5 mg, Oral, Daily at bedtime  . nitrofurantoin (MACRODANTIN) 50 mg, Oral, Daily at bedtime, Continuous course  . omeprazole (PRILOSEC) 20 mg, Oral, Daily  . senna (SENOKOT) 8.6 MG TABS tablet 1 tablet, Oral, Daily PRN  . Vitamin D3 (VITAMIN D) 2,000 Units, Oral, 2 times daily    No results found for this or any previous visit (from the past 48 hour(s)).  DG Elbow 2 Views Left  Result Date: 01/14/2020 CLINICAL DATA:  Fall, prior left humeral fracture status post ORIF EXAM: LEFT SHOULDER - 2+ VIEW; LEFT ELBOW - 2 VIEW; LEFT FOREARM - 2 VIEW; LEFT HUMERUS - 2+ VIEW COMPARISON:  01/04/2020 FINDINGS: The there is an acute perihardware fracture involving the mid to distal left humeral diaphysis with prominent anteromedial angulation of the fracture. There is a 6 cm butterfly fragment displaced posteriorly. The distal component of the left humeral ORIF hardware is no longer located within the distal humerus. The more proximal screws remain  appropriately located within the proximal humeral diaphysis. Alignment at the shoulder and elbow are maintained without dislocation. The radius and ulna appear intact without fracture or malalignment. Osseous structures are diffusely demineralized. There is soft tissue swelling at the fracture site. IMPRESSION: Acute perihardware fracture involving the mid to distal left humeral diaphysis with prominent anteromedial angulation. The distal screws of the left humeral ORIF hardware are no longer located within the distal humerus. Electronically Signed   By: Davina Poke D.O.   On: 01/14/2020 15:50   DG Forearm Left  Result Date: 01/14/2020 CLINICAL DATA:  Fall, prior left humeral fracture status post ORIF EXAM: LEFT SHOULDER - 2+ VIEW; LEFT ELBOW - 2 VIEW; LEFT FOREARM - 2 VIEW; LEFT HUMERUS - 2+ VIEW COMPARISON:  01/04/2020 FINDINGS: The there is an acute perihardware fracture involving the mid to distal left humeral diaphysis with prominent anteromedial angulation of the fracture. There is a 6 cm butterfly fragment displaced posteriorly. The distal component of the left humeral ORIF hardware  is no longer located within the distal humerus. The more proximal screws remain appropriately located within the proximal humeral diaphysis. Alignment at the shoulder and elbow are maintained without dislocation. The radius and ulna appear intact without fracture or malalignment. Osseous structures are diffusely demineralized. There is soft tissue swelling at the fracture site. IMPRESSION: Acute perihardware fracture involving the mid to distal left humeral diaphysis with prominent anteromedial angulation. The distal screws of the left humeral ORIF hardware are no longer located within the distal humerus. Electronically Signed   By: Davina Poke D.O.   On: 01/14/2020 15:50   DG Shoulder Left  Result Date: 01/14/2020 CLINICAL DATA:  Fall, prior left humeral fracture status post ORIF EXAM: LEFT SHOULDER - 2+ VIEW;  LEFT ELBOW - 2 VIEW; LEFT FOREARM - 2 VIEW; LEFT HUMERUS - 2+ VIEW COMPARISON:  01/04/2020 FINDINGS: The there is an acute perihardware fracture involving the mid to distal left humeral diaphysis with prominent anteromedial angulation of the fracture. There is a 6 cm butterfly fragment displaced posteriorly. The distal component of the left humeral ORIF hardware is no longer located within the distal humerus. The more proximal screws remain appropriately located within the proximal humeral diaphysis. Alignment at the shoulder and elbow are maintained without dislocation. The radius and ulna appear intact without fracture or malalignment. Osseous structures are diffusely demineralized. There is soft tissue swelling at the fracture site. IMPRESSION: Acute perihardware fracture involving the mid to distal left humeral diaphysis with prominent anteromedial angulation. The distal screws of the left humeral ORIF hardware are no longer located within the distal humerus. Electronically Signed   By: Davina Poke D.O.   On: 01/14/2020 15:50   DG Humerus Left  Result Date: 01/14/2020 CLINICAL DATA:  Fall, prior left humeral fracture status post ORIF EXAM: LEFT SHOULDER - 2+ VIEW; LEFT ELBOW - 2 VIEW; LEFT FOREARM - 2 VIEW; LEFT HUMERUS - 2+ VIEW COMPARISON:  01/04/2020 FINDINGS: The there is an acute perihardware fracture involving the mid to distal left humeral diaphysis with prominent anteromedial angulation of the fracture. There is a 6 cm butterfly fragment displaced posteriorly. The distal component of the left humeral ORIF hardware is no longer located within the distal humerus. The more proximal screws remain appropriately located within the proximal humeral diaphysis. Alignment at the shoulder and elbow are maintained without dislocation. The radius and ulna appear intact without fracture or malalignment. Osseous structures are diffusely demineralized. There is soft tissue swelling at the fracture site.  IMPRESSION: Acute perihardware fracture involving the mid to distal left humeral diaphysis with prominent anteromedial angulation. The distal screws of the left humeral ORIF hardware are no longer located within the distal humerus. Electronically Signed   By: Davina Poke D.O.   On: 01/14/2020 15:50   UE Venous Duplex ([facility] and [facility] ONLY)  Result Date: 01/14/2020 UPPER VENOUS STUDY  Indications: Swelling Other Indications: Recent sx on humeral shaft fracture. Limitations: Stitches, pain tolerance, patient positioning. Comparison Study: no prior Performing Technologist: Abram Sander RVS  Examination Guidelines: A complete evaluation includes B-mode imaging, spectral Doppler, color Doppler, and power Doppler as needed of all accessible portions of each vessel. Bilateral testing is considered an integral part of a complete examination. Limited examinations for reoccurring indications may be performed as noted.  Left Findings: +----------+------------+---------+-----------+----------+---------------------+ LEFT      CompressiblePhasicitySpontaneousProperties       Summary        +----------+------------+---------+-----------+----------+---------------------+ IJV  Full       Yes       Yes                                    +----------+------------+---------+-----------+----------+---------------------+ Subclavian    Full       Yes       Yes                                    +----------+------------+---------+-----------+----------+---------------------+ Axillary      Full       Yes       Yes                                    +----------+------------+---------+-----------+----------+---------------------+ Brachial                 Yes       Yes               unable to compress                                                       due to pain tolerance +----------+------------+---------+-----------+----------+---------------------+ Radial                                               not visualized due to                                                     patient positioning/                                                         unable to move     +----------+------------+---------+-----------+----------+---------------------+ Ulnar                                               not visualized due to                                                     patient positioning/                                                         unable to move     +----------+------------+---------+-----------+----------+---------------------+  Cephalic                                               Not visualized     +----------+------------+---------+-----------+----------+---------------------+ Basilic                                             not visualized due to                                                     patient positioning/                                                         unable to move     +----------+------------+---------+-----------+----------+---------------------+  Summary:  Left: No evidence of deep vein thrombosis in the upper extremity.  *See table(s) above for measurements and observations.  Diagnosing physician: Harold Barban MD Electronically signed by Harold Barban MD on 01/14/2020 at 3:24:23 PM.    Final     Review of Systems  Unable to perform ROS: Dementia   Blood pressure (!) 151/77, pulse 98, temperature 99.1 F (37.3 C), temperature source Oral, resp. rate 18, SpO2 97 %. Physical Exam Vitals and nursing note reviewed.  Constitutional:      General: She is awake.     Comments: Pleasant but confused, no acute distress  Cardiovascular:     Rate and Rhythm: Normal rate and regular rhythm.  Pulmonary:     Effort: Pulmonary effort is normal. No respiratory distress.  Abdominal:     Comments: Soft, nontender, nondistended  Musculoskeletal:     Comments: Left upper  extremity There is no splint in place Extensive edema to the left upper extremity including the hand Incision to the left upper arm is stable no active drainage appreciated there is some redness around the incision but it appears to be irritation.  No active evidence of infection Sutures are stable and intact There is some white substance left upper extremity not exactly sure what this is.  I did not use plaster as part of her previous splint.  Used Company secretary. Radial, ulnar, median nerve motor and sensory functions are grossly intact + Radial pulse Did not manipulate her left upper extremity Forearm is stable with no crepitus or gross motion with manipulation.  Hand and wrist are stable as well  Skin:    General: Skin is warm.  Psychiatric:     Comments: Dementia         Assessment/Plan:  86 year old female with repeat hardware failure due to severely osteoporotic bone left humerus  -Repeat hardware failure left humerus  Patient will need to return to the OR for hardware removal.  Return to the OR on Thursday. unfortunately her bone quality will not allow for any additional fixation.  After hardware removal she will be placed into either a long-arm splint or a long-arm cast depending on her swelling.  She will remain  in her splint and cast for several weeks with conversion to a Sarmiento fracture brace.  She is in a very difficult situation.  She is at risk for further development of continued symptomatic nonunion and/or the development of pseudoarthrosis.  Unfortunately there are very limited options at this point.   Patient will be admitted to the orthopedic service   New long-arm posterior splint to be applied to assist with fracture immobilization which will hopefully help with pain control and swelling.  If we can get her swelling under control over the next 36 hours we are hopeful that we will be able to place her into a long-arm cast after hardware removal otherwise she  will be placed into a splint initially and then converted to a cast/fracture brace    Ice for swelling and pain control   Elevate left upper extremity for swelling control.  Hand above elbow and elbow above heart   Patient appears to have very limited peripheral IV access.  Will order PICC placement to allow for administration of medications and fluids as well as for preparation for surgery     - Pain management:  Scheduled Tylenol  Minimize narcotics   Oxycodone 2.5 mg p.o. every 3 hours as needed for severe pain  - ABL anemia/Hemodynamics  CBC pending  Monitor blood pressures  - Medical issues   Monitor  Medication reconciliation  - DVT/PE prophylaxis:  No pharmacologic  - ID:   + Multidrug-resistant Klebsiella on previous urine culture   Pharmacy consult to assist with gentamicin dosing given chronic renal disease   Will discontinue nitrofurantoin at this point  - Metabolic Bone Disease:  Osteoporosis  - Activity:  Delirium precautions  Up with assistance only  - FEN/GI prophylaxis/Foley/Lines:  Dysphagia 3 diet  Speech therapy eval completed during previous admissions along with MBS.  Recommendations below     CHL IP DIET RECOMMENDATION 01/02/2020  SLP Diet Recommendations Dysphagia 3 (Mech soft) solids;Thin liquid  Liquid Administration via Cup;Straw  Medication Administration Crushed with puree  Compensations Small sips/bites  Postural Changes --    - Impediments to fracture healing:  Profound osteoporosis  Dementia  - Dispo:  Admit for observation and pain control  OR on Thursday for removal of hardware left humerus  Social work eval: Son considering alternatives for discharge venue    Jari Pigg, PA-C [phone] (C) 01/14/2020, 4:36 PM  [facility] [place] [id number] [phone] (F)

## 2020-01-14 NOTE — Progress Notes (Signed)
This patient has a left arm restriction and very limited vein access even with the ultrasound. If this patient continues to need IV access, I would recommend a central line or PICC line

## 2020-01-14 NOTE — Progress Notes (Signed)
Orthopedic Tech Progress Note Patient Details:  Shannon Joyce Oct 11, 1925 [id number]  Ortho Devices Type of Ortho Device: Ace wrap, Shoulder immobilizer, Post (long arm) splint Ortho Device/Splint Interventions: Application   Post Interventions Patient Tolerated: Well Instructions Provided: Care of device   [name] 01/14/2020, 6:12 PM

## 2020-01-14 NOTE — ED Provider Notes (Signed)
[facility] EMERGENCY DEPARTMENT Provider Note   CSN: [id number] Arrival date & time: 01/14/20  1300     History Chief Complaint  Patient presents with  . Shoulder Pain    Shannon Joyce is a 86 y.o. female brought in by EMS from [facility] memory care [facility] for severe swelling and left arm pain.  She is status post ORIF x2 of the left humerus.  She had initial fall and fracture with repair on 16 January.  Hardware unfortunately came loose from the bone she had a second repair with Dr. Marcelino Scot on 01/05/2020.  She was discharged in splint and bandaging.  She did not have any DVT prophylaxis.  Patient's son is at bedside to provide history.  He states that he was told that she was in excruciating pain at her nursing care facility.  He also states that he was told she had some significant swelling.  Patient is unable to provide history but states that she is not feeling any pain at this time and was given pain medication by EMS prior to arrival.  HPI     Past Medical History:  Diagnosis Date  . Chronic kidney disease   . Chronic UTI 01/08/2020  . Dementia ([place])   . Dementia ([place]) 01/08/2020  . Hypertension   . UTI (lower urinary tract infection)   . Vitamin D insufficiency 01/07/2020    Patient Active Problem List   Diagnosis Date Noted  . Closed traumatic displaced fracture of shaft of left humerus 01/14/2020  . Chronic UTI 01/08/2020  . Dementia ([place]) 01/08/2020  . Vitamin D insufficiency 01/07/2020  . Chronic kidney disease   . Open displaced spiral fracture of shaft of left humerus 01/05/2020  . Open left humeral fracture 01/04/2020  . AKI (acute kidney injury) ([place])   . Displaced fracture of shaft of left humerus 12/28/2019  . Fracture of humeral shaft, left, closed 12/27/2019    Past Surgical History:  Procedure Laterality Date  . ABDOMINAL HYSTERECTOMY    . CHOLECYSTECTOMY    . ORIF HUMERUS FRACTURE Left 12/27/2019   Procedure: OPEN REDUCTION  INTERNAL FIXATION (ORIF) HUMERAL SHAFT FRACTURE;  Surgeon: Hiram Gash, MD;  Location: [facility];  Service: Orthopedics;  Laterality: Left;  . ORIF HUMERUS FRACTURE Left 01/04/2020   Procedure: REMOVAL HARDWARE, OPEN REDUCTION INTERNAL FIXATION (ORIF) HUMERAL SHAFT FRACTURE;  Surgeon: Altamese Rockland, MD;  Location: [facility];  Service: Orthopedics;  Laterality: Left;  . ORTHOPEDIC SURGERY       OB History   No obstetric history on file.     History reviewed. No pertinent family history.  Social History   Tobacco Use  . Smoking status: Never Smoker  . Smokeless tobacco: Never Used  Substance Use Topics  . Alcohol use: No  . Drug use: No    Home Medications Prior to Admission medications   Medication Sig Start Date End Date Taking? Authorizing Provider  acetaminophen (TYLENOL) 650 MG CR tablet Take 650 mg by mouth every 8 (eight) hours as needed for pain.    [provider]  ascorbic acid (VITAMIN C) 500 MG tablet Take 1 tablet (500 mg total) by mouth daily. 01/09/20   Ainsley Spinner, PA-C  aspirin EC 81 MG tablet Take 81 mg by mouth daily. Until follow up    [provider]  calcium citrate (CALCITRATE - DOSED IN MG ELEMENTAL CALCIUM) 950 (200 Ca) MG tablet Take 1 tablet (200 mg of elemental calcium total)  by mouth 2 (two) times daily. 01/08/20   Ainsley Spinner, PA-C  cholecalciferol (VITAMIN D) 25 MCG tablet Take 2 tablets (2,000 Units total) by mouth 2 (two) times daily. 01/08/20   Ainsley Spinner, PA-C  Ensure Plus (ENSURE PLUS) LIQD Take 237 mLs by mouth 3 (three) times daily between meals.    [provider]  levothyroxine (SYNTHROID) 25 MCG tablet Take 12.5 mcg by mouth daily. 12/16/19   [provider]  metoprolol succinate (TOPROL-XL) 25 MG 24 hr tablet Take 0.5 tablets (12.5 mg total) by mouth 2 (two) times daily. 01/02/20   McBane, Maylene Roes, PA-C  mirtazapine (REMERON) 7.5 MG tablet Take 7.5 mg by mouth at bedtime. 12/25/19   [provider]    nitrofurantoin (MACRODANTIN) 50 MG capsule Take 50 mg by mouth at bedtime. Continuous course 12/01/14   [provider]  omeprazole (PRILOSEC) 20 MG capsule Take 20 mg by mouth daily. 12/18/19   [provider]  senna (SENOKOT) 8.6 MG TABS tablet Take 1 tablet by mouth daily as needed for mild constipation.    [provider]    Allergies    Clinoril [sulindac], Darvon [propoxyphene], Levaquin [levofloxacin in d5w], Meropenem, Penicillins, Sulfa antibiotics, Cefadroxil, and Gentamicin  Review of Systems   Review of Systems Unable to review systems secondary to dementia Physical Exam Updated Vital Signs BP (!) 151/77 (BP Location: Right Arm)   Pulse 98   Temp 99.1 F (37.3 C) (Oral)   Resp 18   SpO2 97%   Physical Exam  Nursing note and vitals reviewed. Constitutional: She appears well-developed and well-nourished. No distress.  HENT:  Head: Normocephalic and atraumatic.  Eyes: Conjunctivae normal and EOM are normal. Pupils are equal, round, and reactive to light. No scleral icterus.  Neck: Normal range of motion.  Cardiovascular: Normal rate, regular rhythm and normal heart sounds.  Exam reveals no gallop and no friction rub.   No murmur heard. Pulmonary/Chest: Effort normal and breath sounds normal. No respiratory distress.  Abdominal: Soft. Bowel sounds are normal. She exhibits no distension and no mass. There is no tenderness. There is no guarding.  Musculoskeletal: Left arm with impressive pitting edema to the fingers.  Radial pulses palpable.  There is a well-healing incision over the region of the left bicep.  Mild bruising and erythema without evidence of infection.  There is venous distention up to the shoulder.  Pain with any passive movement of the left arm.  Neurological: She is alert and oriented to person, place, and time.  Skin: Skin is warm and dry. She is not diaphoretic.   ED Results / Procedures / Treatments   Labs (all labs ordered  are listed, but only abnormal results are displayed) Labs Reviewed  URINE CULTURE  SARS CORONAVIRUS 2 (TAT 6-24 HRS)  BASIC METABOLIC PANEL  CBC WITH DIFFERENTIAL/PLATELET  URINALYSIS, ROUTINE W REFLEX MICROSCOPIC    EKG None  Radiology DG Elbow 2 Views Left  Result Date: 01/14/2020 CLINICAL DATA:  Fall, prior left humeral fracture status post ORIF EXAM: LEFT SHOULDER - 2+ VIEW; LEFT ELBOW - 2 VIEW; LEFT FOREARM - 2 VIEW; LEFT HUMERUS - 2+ VIEW COMPARISON:  01/04/2020 FINDINGS: The there is an acute perihardware fracture involving the mid to distal left humeral diaphysis with prominent anteromedial angulation of the fracture. There is a 6 cm butterfly fragment displaced posteriorly. The distal component of the left humeral ORIF hardware is no longer located within the distal humerus. The more proximal screws remain appropriately  located within the proximal humeral diaphysis. Alignment at the shoulder and elbow are maintained without dislocation. The radius and ulna appear intact without fracture or malalignment. Osseous structures are diffusely demineralized. There is soft tissue swelling at the fracture site. IMPRESSION: Acute perihardware fracture involving the mid to distal left humeral diaphysis with prominent anteromedial angulation. The distal screws of the left humeral ORIF hardware are no longer located within the distal humerus. Electronically Signed   By: Davina Poke D.O.   On: 01/14/2020 15:50   DG Forearm Left  Result Date: 01/14/2020 CLINICAL DATA:  Fall, prior left humeral fracture status post ORIF EXAM: LEFT SHOULDER - 2+ VIEW; LEFT ELBOW - 2 VIEW; LEFT FOREARM - 2 VIEW; LEFT HUMERUS - 2+ VIEW COMPARISON:  01/04/2020 FINDINGS: The there is an acute perihardware fracture involving the mid to distal left humeral diaphysis with prominent anteromedial angulation of the fracture. There is a 6 cm butterfly fragment displaced posteriorly. The distal component of the left humeral ORIF  hardware is no longer located within the distal humerus. The more proximal screws remain appropriately located within the proximal humeral diaphysis. Alignment at the shoulder and elbow are maintained without dislocation. The radius and ulna appear intact without fracture or malalignment. Osseous structures are diffusely demineralized. There is soft tissue swelling at the fracture site. IMPRESSION: Acute perihardware fracture involving the mid to distal left humeral diaphysis with prominent anteromedial angulation. The distal screws of the left humeral ORIF hardware are no longer located within the distal humerus. Electronically Signed   By: Davina Poke D.O.   On: 01/14/2020 15:50   DG Shoulder Left  Result Date: 01/14/2020 CLINICAL DATA:  Fall, prior left humeral fracture status post ORIF EXAM: LEFT SHOULDER - 2+ VIEW; LEFT ELBOW - 2 VIEW; LEFT FOREARM - 2 VIEW; LEFT HUMERUS - 2+ VIEW COMPARISON:  01/04/2020 FINDINGS: The there is an acute perihardware fracture involving the mid to distal left humeral diaphysis with prominent anteromedial angulation of the fracture. There is a 6 cm butterfly fragment displaced posteriorly. The distal component of the left humeral ORIF hardware is no longer located within the distal humerus. The more proximal screws remain appropriately located within the proximal humeral diaphysis. Alignment at the shoulder and elbow are maintained without dislocation. The radius and ulna appear intact without fracture or malalignment. Osseous structures are diffusely demineralized. There is soft tissue swelling at the fracture site. IMPRESSION: Acute perihardware fracture involving the mid to distal left humeral diaphysis with prominent anteromedial angulation. The distal screws of the left humeral ORIF hardware are no longer located within the distal humerus. Electronically Signed   By: Davina Poke D.O.   On: 01/14/2020 15:50   DG Humerus Left  Result Date: 01/14/2020 CLINICAL  DATA:  Fall, prior left humeral fracture status post ORIF EXAM: LEFT SHOULDER - 2+ VIEW; LEFT ELBOW - 2 VIEW; LEFT FOREARM - 2 VIEW; LEFT HUMERUS - 2+ VIEW COMPARISON:  01/04/2020 FINDINGS: The there is an acute perihardware fracture involving the mid to distal left humeral diaphysis with prominent anteromedial angulation of the fracture. There is a 6 cm butterfly fragment displaced posteriorly. The distal component of the left humeral ORIF hardware is no longer located within the distal humerus. The more proximal screws remain appropriately located within the proximal humeral diaphysis. Alignment at the shoulder and elbow are maintained without dislocation. The radius and ulna appear intact without fracture or malalignment. Osseous structures are diffusely demineralized. There is soft tissue swelling at the fracture site.  IMPRESSION: Acute perihardware fracture involving the mid to distal left humeral diaphysis with prominent anteromedial angulation. The distal screws of the left humeral ORIF hardware are no longer located within the distal humerus. Electronically Signed   By: Davina Poke D.O.   On: 01/14/2020 15:50   UE Venous Duplex ([facility] and [facility] ONLY)  Result Date: 01/14/2020 UPPER VENOUS STUDY  Indications: Swelling Other Indications: Recent sx on humeral shaft fracture. Limitations: Stitches, pain tolerance, patient positioning. Comparison Study: no prior Performing Technologist: Abram Sander RVS  Examination Guidelines: A complete evaluation includes B-mode imaging, spectral Doppler, color Doppler, and power Doppler as needed of all accessible portions of each vessel. Bilateral testing is considered an integral part of a complete examination. Limited examinations for reoccurring indications may be performed as noted.  Left Findings: +----------+------------+---------+-----------+----------+---------------------+ LEFT      CompressiblePhasicitySpontaneousProperties       Summary         +----------+------------+---------+-----------+----------+---------------------+ IJV           Full       Yes       Yes                                    +----------+------------+---------+-----------+----------+---------------------+ Subclavian    Full       Yes       Yes                                    +----------+------------+---------+-----------+----------+---------------------+ Axillary      Full       Yes       Yes                                    +----------+------------+---------+-----------+----------+---------------------+ Brachial                 Yes       Yes               unable to compress                                                       due to pain tolerance +----------+------------+---------+-----------+----------+---------------------+ Radial                                              not visualized due to                                                     patient positioning/                                                         unable to move     +----------+------------+---------+-----------+----------+---------------------+  Ulnar                                               not visualized due to                                                     patient positioning/                                                         unable to move     +----------+------------+---------+-----------+----------+---------------------+ Cephalic                                               Not visualized     +----------+------------+---------+-----------+----------+---------------------+ Basilic                                             not visualized due to                                                     patient positioning/                                                         unable to move     +----------+------------+---------+-----------+----------+---------------------+   Summary:  Left: No evidence of deep vein thrombosis in the upper extremity.  *See table(s) above for measurements and observations.  Diagnosing physician: Harold Barban MD Electronically signed by Harold Barban MD on 01/14/2020 at 3:24:23 PM.    Final    [place] EKG SITE RITE  Result Date: 01/14/2020 If Site Rite image not attached, placement could not be confirmed due to current cardiac rhythm.   Procedures Procedures (including critical care time)  Medications Ordered in ED Medications  0.9 % NaCl with KCl 20 mEq/ L  infusion (has no administration in time range)  acetaminophen (TYLENOL) tablet 500 mg (has no administration in time range)    Or  acetaminophen (TYLENOL) suppository 650 mg (has no administration in time range)  oxyCODONE (Oxy IR/ROXICODONE) immediate release tablet 2.5 mg (2.5 mg Oral Not Given 01/14/20 1713)  haloperidol (HALDOL) tablet 0.5 mg (has no administration in time range)  oxyCODONE (Oxy IR/ROXICODONE) immediate release tablet 5 mg (has no administration in time range)  HYDROmorphone (DILAUDID) injection 0.5 mg (0.5 mg Intramuscular Given 01/14/20 1554)    ED Course  I have reviewed the triage vital signs and the nursing notes.  Pertinent labs & imaging results that were  available during my care of the patient were reviewed by me and considered in my medical decision making (see chart for details).    MDM Rules/Calculators/A&P                       86 y/F here with L arm swelling after ORIF x2. Patient supposed to be in splint, however, this was removed by an unknown agent. I personally reviewed the patient's plain films which shows an acute peri-hardware fracture of the left humeral diaphysis.  The patient's vasc US are negative for acute dvt. I have discussed the case with PA Ainsley Spinner [facility]. He will admit the Patient for acute fracture. Labs/ covid pending. Pain controlled in the ED. Final Clinical Impression(s) / ED Diagnoses Final  diagnoses:  Other closed fracture of shaft of left humerus, initial encounter    Rx / DC Orders ED Discharge Orders    None       Margarita Mail, PA-C 01/14/20 1800    Maudie Flakes, MD 01/17/20 [phone]

## 2020-01-14 NOTE — Progress Notes (Signed)
Peripherally Inserted Central Catheter/Midline Placement  The IV Nurse has discussed with the patient and/or persons authorized to consent for the patient, the purpose of this procedure and the potential benefits and risks involved with this procedure.  The benefits include less needle sticks, lab draws from the catheter, and the patient may be discharged home with the catheter. Risks include, but not limited to, infection, bleeding, blood clot (thrombus formation), and puncture of an artery; nerve damage and irregular heartbeat and possibility to perform a PICC exchange if needed/ordered by physician.  Alternatives to this procedure were also discussed.  Bard Power PICC patient education guide, fact sheet on infection prevention and patient information card has been provided to patient /or left at bedside.    PICC/Midline Placement Documentation  PICC Single Lumen 01/14/20 PICC Right Brachial 33 cm 0 cm (Active)  Indication for Insertion or Continuance of Line Limited venous access - need for IV therapy >5 days (PICC only) 01/14/20 2220  Exposed Catheter (cm) 0 cm 01/14/20 2220  Site Assessment Clean;Dry;Intact 01/14/20 2220  Line Status Blood return noted;Flushed;Saline locked 01/14/20 2220  Dressing Type Transparent;Occlusive;Securing device 01/14/20 2220  Dressing Status Clean;Dry;Intact;Antimicrobial disc in place 01/14/20 2220  Line Adjustment (NICU/IV Team Only) No 01/14/20 2220  Dressing Intervention New dressing 01/14/20 2220  Dressing Change Due 01/21/20 01/14/20 2220       Edson Snowball 01/14/2020, 10:32 PM

## 2020-01-14 NOTE — ED Notes (Signed)
Pt transported to room 58 for u/s

## 2020-01-15 ENCOUNTER — Encounter (HOSPITAL_COMMUNITY): Payer: Self-pay | Admitting: Orthopedic Surgery

## 2020-01-15 DIAGNOSIS — M80029A Age-related osteoporosis with current pathological fracture, unspecified humerus, initial encounter for fracture: Secondary | ICD-10-CM

## 2020-01-15 HISTORY — DX: Age-related osteoporosis with current pathological fracture, unspecified humerus, initial encounter for fracture: M80.029A

## 2020-01-15 LAB — URINE CULTURE

## 2020-01-15 MED ORDER — CALCIUM CITRATE 950 (200 CA) MG PO TABS
200.0000 mg | ORAL_TABLET | Freq: Two times a day (BID) | ORAL | Status: DC
  Administered 2020-01-15 – 2020-01-21 (×11): 200 mg via ORAL
  Filled 2020-01-15 (×14): qty 1

## 2020-01-15 MED ORDER — METOPROLOL SUCCINATE ER 25 MG PO TB24
12.5000 mg | ORAL_TABLET | Freq: Two times a day (BID) | ORAL | Status: DC
  Administered 2020-01-15 – 2020-01-17 (×4): 12.5 mg via ORAL
  Filled 2020-01-15 (×4): qty 1

## 2020-01-15 MED ORDER — MIRTAZAPINE 15 MG PO TABS
7.5000 mg | ORAL_TABLET | Freq: Every day | ORAL | Status: DC
  Administered 2020-01-15 – 2020-01-20 (×6): 7.5 mg via ORAL
  Filled 2020-01-15 (×5): qty 1

## 2020-01-15 MED ORDER — LEVOTHYROXINE SODIUM 25 MCG PO TABS
12.5000 ug | ORAL_TABLET | Freq: Every day | ORAL | Status: DC
  Administered 2020-01-16 – 2020-01-21 (×6): 12.5 ug via ORAL
  Filled 2020-01-15 (×6): qty 1

## 2020-01-15 MED ORDER — PANTOPRAZOLE SODIUM 40 MG PO TBEC
40.0000 mg | DELAYED_RELEASE_TABLET | Freq: Every day | ORAL | Status: DC
  Administered 2020-01-15 – 2020-01-21 (×6): 40 mg via ORAL
  Filled 2020-01-15 (×6): qty 1

## 2020-01-15 MED ORDER — ENSURE ENLIVE PO LIQD
237.0000 mL | Freq: Three times a day (TID) | ORAL | Status: DC
  Administered 2020-01-15 – 2020-01-21 (×14): 237 mL via ORAL

## 2020-01-15 MED ORDER — VITAMIN D 25 MCG (1000 UNIT) PO TABS
2000.0000 [IU] | ORAL_TABLET | Freq: Two times a day (BID) | ORAL | Status: DC
  Administered 2020-01-15 – 2020-01-21 (×12): 2000 [IU] via ORAL
  Filled 2020-01-15 (×12): qty 2

## 2020-01-15 MED ORDER — CLINDAMYCIN PHOSPHATE 600 MG/50ML IV SOLN
600.0000 mg | INTRAVENOUS | Status: AC
  Administered 2020-01-16: 600 mg via INTRAVENOUS
  Filled 2020-01-15: qty 50

## 2020-01-15 MED ORDER — ASCORBIC ACID 500 MG PO TABS
500.0000 mg | ORAL_TABLET | Freq: Every day | ORAL | Status: DC
  Administered 2020-01-15 – 2020-01-21 (×6): 500 mg via ORAL
  Filled 2020-01-15 (×6): qty 1

## 2020-01-15 NOTE — Plan of Care (Signed)

## 2020-01-15 NOTE — Plan of Care (Signed)

## 2020-01-15 NOTE — Progress Notes (Signed)
[facility] Progress Note  Patient ID: Shannon Joyce MRN: [id number] DOB/AGE: 84-Sep-1926 84 y.o.  Subjective:  No acute events overnight Mental status appears to be at baseline C/o pain but when asked where she states her mouth (healing abrasions to lower mouth)  PICC line place for IV access    ROS As above  Objective:   VITALS:   Vitals:   01/14/20 2057 01/14/20 2128 01/15/20 0300 01/15/20 0840  BP:  (!) 149/65 (!) 150/61 133/74  Pulse:  100 98 92  Resp: 16 19 18 17   Temp:  99.3 F (37.4 C) 98.9 F (37.2 C)   TempSrc:  Oral Oral   SpO2: 94% 93% 95% 100%    Estimated body mass index is 19.74 kg/m as calculated from the following:   Height as of 01/06/20: 5\' 4"  (1.626 m).   Weight as of 01/06/20: 52.2 kg.   Intake/Output      02/02 0701 - 02/03 0700 02/03 0701 - 02/04 0700   I.V. 367.1    Total Intake 367.1    Net +367.1           LABS  Results for orders placed or performed during the hospital encounter of 01/14/20 (from the past 24 hour(s))  SARS CORONAVIRUS 2 (TAT 6-24 HRS) Nasopharyngeal Nasopharyngeal Swab     Status: None   Collection Time: 01/14/20  5:29 PM   Specimen: Nasopharyngeal Swab  Result Value Ref Range   SARS Coronavirus 2 NEGATIVE NEGATIVE  Basic metabolic panel     Status: Abnormal   Collection Time: 01/14/20  6:52 PM  Result Value Ref Range   Sodium 139 135 - 145 mmol/L   Potassium 4.0 3.5 - 5.1 mmol/L   Chloride 105 98 - 111 mmol/L   CO2 26 22 - 32 mmol/L   Glucose, Bld 157 (H) 70 - 99 mg/dL   BUN 24 (H) 8 - 23 mg/dL   Creatinine, Ser 1.44 (H) 0.44 - 1.00 mg/dL   Calcium 8.6 (L) 8.9 - 10.3 mg/dL   GFR calc non Af Amer 31 (L) >60 mL/min   GFR calc Af Amer 36 (L) >60 mL/min   Anion gap 8 5 - 15  CBC with Differential     Status: Abnormal   Collection Time: 01/14/20  6:52 PM  Result Value Ref Range   WBC 13.9 (H) 4.0 - 10.5 K/uL   RBC 3.48 (L)  3.87 - 5.11 MIL/uL   Hemoglobin 11.6 (L) 12.0 - 15.0 g/dL   HCT 37.8 36.0 - 46.0 %   MCV 108.6 (H) 80.0 - 100.0 fL   MCH 33.3 26.0 - 34.0 pg   MCHC 30.7 30.0 - 36.0 g/dL   RDW 17.8 (H) 11.5 - 15.5 %   Platelets 165 150 - 400 K/uL   nRBC 0.0 0.0 - 0.2 %   Neutrophils Relative % 76 %   Neutro Abs 10.5 (H) 1.7 - 7.7 K/uL   Lymphocytes Relative 11 %   Lymphs Abs 1.6 0.7 - 4.0 K/uL   Monocytes Relative 10 %   Monocytes Absolute 1.3 (H) 0.1 - 1.0 K/uL   Eosinophils Relative 2 %   Eosinophils Absolute 0.3 0.0 - 0.5 K/uL   Basophils Relative 0 %   Basophils Absolute 0.1 0.0 - 0.1 K/uL   Immature Granulocytes  1 %   Abs Immature Granulocytes 0.12 (H) 0.00 - 0.07 K/uL  Culture, Urine     Status: None (Preliminary result)   Collection Time: 01/14/20  8:15 PM   Specimen: Urine, Random  Result Value Ref Range   Specimen Description      URINE, RANDOM Performed at [facility], [place] [address], [place], [place] [address]    Special Requests NONE    Culture PENDING    Report Status PENDING   Urinalysis, Routine w reflex microscopic     Status: Abnormal   Collection Time: 01/14/20  8:15 PM  Result Value Ref Range   Color, Urine YELLOW YELLOW   APPearance CLOUDY (A) CLEAR   Specific Gravity, Urine 1.013 1.005 - 1.030   pH 7.0 5.0 - 8.0   Glucose, UA NEGATIVE NEGATIVE mg/dL   Hgb urine dipstick NEGATIVE NEGATIVE   Bilirubin Urine NEGATIVE NEGATIVE   Ketones, ur NEGATIVE NEGATIVE mg/dL   Protein, ur 30 (A) NEGATIVE mg/dL   Nitrite POSITIVE (A) NEGATIVE   Leukocytes,Ua LARGE (A) NEGATIVE   RBC / HPF 0-5 0 - 5 RBC/hpf   WBC, UA 11-20 0 - 5 WBC/hpf   Bacteria, UA MANY (A) NONE SEEN   Squamous Epithelial / LPF 6-10 0 - 5     PHYSICAL EXAM:   Gen: resting comfortably in bed, NAD, pleasant and talking  Lungs: clear anterior fields Cardiac: RRR, s1 and s2 Abd: + BS, NTND Ext:       Left Upper extremity   Posterior LAS in place  Swelling improved  Moving digits   Sensation  grossly intact  Ext warm  No pain with passive stretch   No acute changes in exam   Assessment/Plan:     Principal Problem:   Closed traumatic displaced fracture of shaft of left humerus Active Problems:   Chronic kidney disease   Vitamin D insufficiency   Chronic UTI   Dementia ([facility])   Pathological fracture of humerus due to osteoporosis   Anti-infectives (From admission, onward)   Start     Dose/Rate Route Frequency Ordered Stop   01/16/20 0800  clindamycin (CLEOCIN) IVPB 600 mg     600 mg 100 mL/hr over 30 Minutes Intravenous  Once 01/15/20 0851      .  POD/HD#: 63  86 year old female with repeat hardware failure due to severely osteoporotic bone left humerus   -Repeat hardware failure left humerus             OR tomorrow for [facility] and splinting. Convert to custom sarmiento fracture brace in 2-3 weeks  Continue with ice and elevation    Elevate fingers above elbow and elbow above heart   Ok to move digits to help with swelling control  Ok to get up with assistance only      - Pain management:             Scheduled Tylenol             Minimize narcotics                         Oxycodone 2.5 mg p.o. every 3 hours as needed for severe pain   - ABL anemia/Hemodynamics             stable  Monitor    - Medical issues              Monitor  home meds   - DVT/PE prophylaxis:             No pharmacologics   - ID:              + Multidrug-resistant Klebsiella on previous urine culture                         appears to be asymptomatic at this point   Did discuss with pharmacy   Will await repeat cultures   Will stop nitrofurantoin as it is resistant     - Metabolic Bone Disease:             Osteoporosis   - Activity:             Delirium precautions             Up with assistance only   - FEN/GI prophylaxis/Foley/Lines:  Gentle IVF hydration    Renal functions look to be at baseline and are stable             Dysphagia 3 diet             Speech  therapy eval completed during previous admissions along with MBS.  Recommendations below                                      CHL IP DIET RECOMMENDATION 01/02/2020  SLP Diet Recommendations Dysphagia 3 (Mech soft) solids;Thin liquid  Liquid Administration via Cup;Straw  Medication Administration Crushed with puree  Compensations Small sips/bites  Postural Changes --      - Impediments to fracture healing:             Profound osteoporosis             Dementia   - Dispo:             OR for [facility] tomorrow  SW to eval DC venue options, son wants to discuss       Jari Pigg, PA-C [phone] (C) 01/15/2020, 9:00 AM  [facility] [place] [id number] [phone] Jenetta [phone] (F)   After 6pm on weekdays please call office number to get in touch with on call provider or refer to [place] and look to see who is on call for the Sports Medicine Call Group which is listed under orthopaedics   On Weekends please call office number to get in touch with on call provider or refer to [place] and look to see who is on call for the Sports Medicine Call Group which is listed under orthopaedics

## 2020-01-15 NOTE — Anesthesia Preprocedure Evaluation (Addendum)
Anesthesia Evaluation  Patient identified by MRN, date of birth, ID band Patient awake    Reviewed: Allergy & Precautions, H&P , NPO status , Patient's Chart, lab work & pertinent test results  History of Anesthesia Complications Negative for: history of anesthetic complications  Airway Mallampati: II  TM Distance: >3 FB Neck ROM: Full    Dental  (+) Edentulous Upper, Edentulous Lower, Dental Advisory Given   Pulmonary neg pulmonary ROS,    Pulmonary exam normal        Cardiovascular hypertension, Normal cardiovascular exam     Neuro/Psych negative neurological ROS     GI/Hepatic negative GI ROS, Neg liver ROS,   Endo/Other  negative endocrine ROS  Renal/GU Renal InsufficiencyRenal disease     Musculoskeletal   Abdominal   Peds  Hematology  (+) anemia ,   Anesthesia Other Findings   Reproductive/Obstetrics                            Anesthesia Physical  Anesthesia Plan  ASA: III  Anesthesia Plan: General   Post-op Pain Management:  Regional for Post-op pain   Induction: Intravenous  PONV Risk Score and Plan: 3 and Ondansetron, Dexamethasone and Treatment may vary due to age or medical condition  Airway Management Planned: Oral ETT  Additional Equipment:   Intra-op Plan:   Post-operative Plan: Extubation in OR  Informed Consent:   Plan Discussed with: Anesthesiologist  Anesthesia Plan Comments:         Anesthesia Quick Evaluation

## 2020-01-16 ENCOUNTER — Encounter (HOSPITAL_COMMUNITY)
Admission: EM | Disposition: A | Discharge status: Skilled Nursing Facility | Payer: Self-pay | Source: Skilled Nursing Facility | Attending: Orthopedic Surgery

## 2020-01-16 ENCOUNTER — Inpatient Hospital Stay (HOSPITAL_COMMUNITY): Payer: Medicare Other

## 2020-01-16 ENCOUNTER — Inpatient Hospital Stay (HOSPITAL_COMMUNITY): Payer: Medicare Other | Admitting: Anesthesiology

## 2020-01-16 ENCOUNTER — Encounter (HOSPITAL_COMMUNITY): Payer: Self-pay | Admitting: Orthopedic Surgery

## 2020-01-16 HISTORY — PX: HARDWARE REMOVAL: SHX979

## 2020-01-16 LAB — COMPREHENSIVE METABOLIC PANEL
ALT: 14 U/L (ref 0–44)
AST: 25 U/L (ref 15–41)
Albumin: 2.2 g/dL — ABNORMAL LOW (ref 3.5–5.0)
Alkaline Phosphatase: 231 U/L — ABNORMAL HIGH (ref 38–126)
Anion gap: 9 (ref 5–15)
BUN: 22 mg/dL (ref 8–23)
CO2: 29 mmol/L (ref 22–32)
Calcium: 8.6 mg/dL — ABNORMAL LOW (ref 8.9–10.3)
Chloride: 104 mmol/L (ref 98–111)
Creatinine, Ser: 1.32 mg/dL — ABNORMAL HIGH (ref 0.44–1.00)
GFR calc Af Amer: 40 mL/min — ABNORMAL LOW (ref >60)
GFR calc non Af Amer: 35 mL/min — ABNORMAL LOW (ref >60)
Glucose, Bld: 124 mg/dL — ABNORMAL HIGH (ref 70–99)
Potassium: 4.3 mmol/L (ref 3.5–5.1)
Sodium: 142 mmol/L (ref 135–145)
Total Bilirubin: 1.3 mg/dL — ABNORMAL HIGH (ref 0.3–1.2)
Total Protein: 5 g/dL — ABNORMAL LOW (ref 6.5–8.1)

## 2020-01-16 LAB — CBC
HCT: 34 % — ABNORMAL LOW (ref 36.0–46.0)
Hemoglobin: 10.3 g/dL — ABNORMAL LOW (ref 12.0–15.0)
MCH: 33.3 pg (ref 26.0–34.0)
MCHC: 30.3 g/dL (ref 30.0–36.0)
MCV: 110 fL — ABNORMAL HIGH (ref 80.0–100.0)
Platelets: 138 10*3/uL — ABNORMAL LOW (ref 150–400)
RBC: 3.09 MIL/uL — ABNORMAL LOW (ref 3.87–5.11)
RDW: 17.6 % — ABNORMAL HIGH (ref 11.5–15.5)
WBC: 11.4 10*3/uL — ABNORMAL HIGH (ref 4.0–10.5)
nRBC: 0 % (ref 0.0–0.2)

## 2020-01-16 LAB — PROTIME-INR
INR: 1.1 (ref 0.8–1.2)
Prothrombin Time: 14.2 seconds (ref 11.4–15.2)

## 2020-01-16 SURGERY — REMOVAL, HARDWARE
Anesthesia: General | Laterality: Left

## 2020-01-16 MED ORDER — ACETAMINOPHEN 500 MG PO TABS
ORAL_TABLET | ORAL | Status: AC
  Administered 2020-01-16: 500 mg via ORAL
  Filled 2020-01-16: qty 2

## 2020-01-16 MED ORDER — ONDANSETRON HCL 4 MG/2ML IJ SOLN
INTRAMUSCULAR | Status: AC
  Filled 2020-01-16: qty 2

## 2020-01-16 MED ORDER — MIDAZOLAM HCL 2 MG/2ML IJ SOLN
INTRAMUSCULAR | Status: AC
  Filled 2020-01-16: qty 2

## 2020-01-16 MED ORDER — FENTANYL CITRATE (PF) 250 MCG/5ML IJ SOLN
INTRAMUSCULAR | Status: AC
  Filled 2020-01-16: qty 5

## 2020-01-16 MED ORDER — FENTANYL CITRATE (PF) 100 MCG/2ML IJ SOLN: 25.0000 ug | INTRAMUSCULAR | Status: DC | PRN

## 2020-01-16 MED ORDER — VANCOMYCIN HCL 1000 MG IV SOLR
INTRAVENOUS | Status: AC
  Filled 2020-01-16: qty 1000

## 2020-01-16 MED ORDER — FENTANYL CITRATE (PF) 100 MCG/2ML IJ SOLN: INTRAMUSCULAR | Status: DC | PRN

## 2020-01-16 MED ORDER — METOCLOPRAMIDE HCL 5 MG/ML IJ SOLN: 5.0000 mg | Freq: Three times a day (TID) | INTRAMUSCULAR | Status: DC | PRN

## 2020-01-16 MED ORDER — ACETAMINOPHEN 500 MG PO TABS: 1000.0000 mg | ORAL_TABLET | Freq: Once | ORAL | Status: AC

## 2020-01-16 MED ORDER — CLINDAMYCIN PHOSPHATE 600 MG/50ML IV SOLN
600.0000 mg | Freq: Four times a day (QID) | INTRAVENOUS | Status: AC
  Administered 2020-01-16 – 2020-01-17 (×3): 600 mg via INTRAVENOUS
  Filled 2020-01-16 (×3): qty 50

## 2020-01-16 MED ORDER — METOCLOPRAMIDE HCL 5 MG PO TABS: 5.0000 mg | ORAL_TABLET | Freq: Three times a day (TID) | ORAL | Status: DC | PRN

## 2020-01-16 MED ORDER — BUPIVACAINE LIPOSOME 1.3 % IJ SUSP
INTRAMUSCULAR | Status: DC | PRN
  Administered 2020-01-16: 15 mg

## 2020-01-16 MED ORDER — SUCCINYLCHOLINE CHLORIDE 200 MG/10ML IV SOSY
PREFILLED_SYRINGE | INTRAVENOUS | Status: AC
  Filled 2020-01-16: qty 10

## 2020-01-16 MED ORDER — LIDOCAINE 2% (20 MG/ML) 5 ML SYRINGE
INTRAMUSCULAR | Status: AC
  Filled 2020-01-16: qty 5

## 2020-01-16 MED ORDER — PROPOFOL 10 MG/ML IV BOLUS
INTRAVENOUS | Status: DC | PRN
  Administered 2020-01-16: 80 mg via INTRAVENOUS

## 2020-01-16 MED ORDER — LACTATED RINGERS IV SOLN: INTRAVENOUS | Status: DC

## 2020-01-16 MED ORDER — 0.9 % SODIUM CHLORIDE (POUR BTL) OPTIME
TOPICAL | Status: DC | PRN
  Administered 2020-01-16: 1000 mL

## 2020-01-16 MED ORDER — SUCCINYLCHOLINE CHLORIDE 200 MG/10ML IV SOSY
PREFILLED_SYRINGE | INTRAVENOUS | Status: DC | PRN
  Administered 2020-01-16: 100 mg via INTRAVENOUS

## 2020-01-16 MED ORDER — FENTANYL CITRATE (PF) 100 MCG/2ML IJ SOLN
INTRAMUSCULAR | Status: AC
  Administered 2020-01-16: 50 ug via INTRAVENOUS
  Filled 2020-01-16: qty 2

## 2020-01-16 MED ORDER — PHENYLEPHRINE 40 MCG/ML (10ML) SYRINGE FOR IV PUSH (FOR BLOOD PRESSURE SUPPORT)
PREFILLED_SYRINGE | INTRAVENOUS | Status: DC | PRN
  Administered 2020-01-16 (×2): 120 ug via INTRAVENOUS
  Administered 2020-01-16: 160 ug via INTRAVENOUS

## 2020-01-16 MED ORDER — DOCUSATE SODIUM 100 MG PO CAPS
100.0000 mg | ORAL_CAPSULE | Freq: Two times a day (BID) | ORAL | Status: DC
  Administered 2020-01-16 – 2020-01-21 (×10): 100 mg via ORAL
  Filled 2020-01-16 (×10): qty 1

## 2020-01-16 MED ORDER — VANCOMYCIN HCL 1000 MG IV SOLR
INTRAVENOUS | Status: DC | PRN
  Administered 2020-01-16: 1000 mg

## 2020-01-16 MED ORDER — ONDANSETRON HCL 4 MG/2ML IJ SOLN: 4.0000 mg | Freq: Four times a day (QID) | INTRAMUSCULAR | Status: DC | PRN

## 2020-01-16 MED ORDER — PHENYLEPHRINE 40 MCG/ML (10ML) SYRINGE FOR IV PUSH (FOR BLOOD PRESSURE SUPPORT)
PREFILLED_SYRINGE | INTRAVENOUS | Status: AC
  Filled 2020-01-16: qty 10

## 2020-01-16 MED ORDER — ALBUMIN HUMAN 5 % IV SOLN: INTRAVENOUS | Status: DC | PRN

## 2020-01-16 MED ORDER — PROMETHAZINE HCL 25 MG/ML IJ SOLN: 6.2500 mg | INTRAMUSCULAR | Status: DC | PRN

## 2020-01-16 MED ORDER — PROPOFOL 10 MG/ML IV BOLUS
INTRAVENOUS | Status: AC
  Filled 2020-01-16: qty 20

## 2020-01-16 MED ORDER — PHENYLEPHRINE HCL-NACL 10-0.9 MG/250ML-% IV SOLN
INTRAVENOUS | Status: DC | PRN
  Administered 2020-01-16: 25 ug/min via INTRAVENOUS
  Administered 2020-01-16: 20 ug/min via INTRAVENOUS

## 2020-01-16 MED ORDER — ONDANSETRON HCL 4 MG PO TABS: 4.0000 mg | ORAL_TABLET | Freq: Four times a day (QID) | ORAL | Status: DC | PRN

## 2020-01-16 MED ORDER — DEXAMETHASONE SODIUM PHOSPHATE 10 MG/ML IJ SOLN
INTRAMUSCULAR | Status: AC
  Filled 2020-01-16: qty 1

## 2020-01-16 MED ORDER — DEXAMETHASONE SODIUM PHOSPHATE 4 MG/ML IJ SOLN
INTRAMUSCULAR | Status: DC | PRN
  Administered 2020-01-16: 4 mg via INTRAVENOUS

## 2020-01-16 MED ORDER — WHITE PETROLATUM EX OINT
TOPICAL_OINTMENT | CUTANEOUS | Status: AC
  Filled 2020-01-16: qty 28.35

## 2020-01-16 MED ORDER — ONDANSETRON HCL 4 MG/2ML IJ SOLN
INTRAMUSCULAR | Status: DC | PRN
  Administered 2020-01-16: 4 mg via INTRAVENOUS

## 2020-01-16 MED ORDER — FENTANYL CITRATE (PF) 100 MCG/2ML IJ SOLN: 50.0000 ug | Freq: Once | INTRAMUSCULAR | Status: AC

## 2020-01-16 MED ORDER — BUPIVACAINE HCL (PF) 0.25 % IJ SOLN
INTRAMUSCULAR | Status: DC | PRN
  Administered 2020-01-16: 10 mg via EPIDURAL

## 2020-01-16 SURGICAL SUPPLY — 77 items
BANDAGE ESMARK 6X9 LF (GAUZE/BANDAGES/DRESSINGS) ×1 IMPLANT
BNDG COHESIVE 3X5 WHT NS (GAUZE/BANDAGES/DRESSINGS) ×3 IMPLANT
BNDG COHESIVE 6X5 TAN STRL LF (GAUZE/BANDAGES/DRESSINGS) ×3 IMPLANT
BNDG ELASTIC 4X5.8 VLCR NS LF (GAUZE/BANDAGES/DRESSINGS) ×3 IMPLANT
BNDG ELASTIC 4X5.8 VLCR STR LF (GAUZE/BANDAGES/DRESSINGS) ×3 IMPLANT
BNDG ELASTIC 6X5.8 VLCR STR LF (GAUZE/BANDAGES/DRESSINGS) ×3 IMPLANT
BNDG ESMARK 6X9 LF (GAUZE/BANDAGES/DRESSINGS) ×3
BNDG GAUZE ELAST 4 BULKY (GAUZE/BANDAGES/DRESSINGS) ×6 IMPLANT
BRUSH SCRUB EZ PLAIN DRY (MISCELLANEOUS) ×6 IMPLANT
CLOSURE WOUND 1/2 X4 (GAUZE/BANDAGES/DRESSINGS) ×1
COTTON STERILE ROLL (GAUZE/BANDAGES/DRESSINGS) ×3 IMPLANT
COVER SURGICAL LIGHT HANDLE (MISCELLANEOUS) ×6 IMPLANT
COVER WAND RF STERILE (DRAPES) ×3 IMPLANT
CUFF TOURN SGL QUICK 18X4 (TOURNIQUET CUFF) IMPLANT
CUFF TOURN SGL QUICK 24 (TOURNIQUET CUFF)
CUFF TOURN SGL QUICK 34 (TOURNIQUET CUFF)
CUFF TRNQT CYL 24X4X16.5-23 (TOURNIQUET CUFF) IMPLANT
CUFF TRNQT CYL 34X4.125X (TOURNIQUET CUFF) IMPLANT
DRAPE C-ARM 42X72 X-RAY (DRAPES) ×3 IMPLANT
DRAPE C-ARMOR (DRAPES) IMPLANT
DRAPE U-SHAPE 47X51 STRL (DRAPES) ×3 IMPLANT
DRSG ADAPTIC 3X8 NADH LF (GAUZE/BANDAGES/DRESSINGS) ×3 IMPLANT
DRSG MEPITEL 4X7.2 (GAUZE/BANDAGES/DRESSINGS) ×3 IMPLANT
ELECT REM PT RETURN 9FT ADLT (ELECTROSURGICAL) ×3
ELECTRODE REM PT RTRN 9FT ADLT (ELECTROSURGICAL) ×1 IMPLANT
GAUZE SPONGE 4X4 12PLY STRL (GAUZE/BANDAGES/DRESSINGS) ×6 IMPLANT
GAUZE SPONGE 4X4 12PLY STRL LF (GAUZE/BANDAGES/DRESSINGS) ×3 IMPLANT
GLOVE BIO SURGEON STRL SZ7.5 (GLOVE) ×3 IMPLANT
GLOVE BIO SURGEON STRL SZ8 (GLOVE) ×3 IMPLANT
GLOVE BIOGEL PI IND STRL 7.5 (GLOVE) ×1 IMPLANT
GLOVE BIOGEL PI IND STRL 8 (GLOVE) ×1 IMPLANT
GLOVE BIOGEL PI INDICATOR 7.5 (GLOVE) ×2
GLOVE BIOGEL PI INDICATOR 8 (GLOVE) ×2
GOWN STRL REUS W/ TWL LRG LVL3 (GOWN DISPOSABLE) ×2 IMPLANT
GOWN STRL REUS W/ TWL XL LVL3 (GOWN DISPOSABLE) ×1 IMPLANT
GOWN STRL REUS W/TWL LRG LVL3 (GOWN DISPOSABLE) ×4
GOWN STRL REUS W/TWL XL LVL3 (GOWN DISPOSABLE) ×2
KIT BASIN OR (CUSTOM PROCEDURE TRAY) ×3 IMPLANT
KIT TURNOVER KIT B (KITS) ×3 IMPLANT
MANIFOLD NEPTUNE II (INSTRUMENTS) IMPLANT
NEEDLE 22X1 1/2 (OR ONLY) (NEEDLE) IMPLANT
NS IRRIG 1000ML POUR BTL (IV SOLUTION) ×3 IMPLANT
PACK ORTHO EXTREMITY (CUSTOM PROCEDURE TRAY) ×3 IMPLANT
PAD ABD 8X10 STRL (GAUZE/BANDAGES/DRESSINGS) ×3 IMPLANT
PAD ARMBOARD 7.5X6 YLW CONV (MISCELLANEOUS) ×6 IMPLANT
PAD CAST 3X4 CTTN HI CHSV (CAST SUPPLIES) ×2 IMPLANT
PAD CAST 4YDX4 CTTN HI CHSV (CAST SUPPLIES) ×1 IMPLANT
PADDING CAST COTTON 3X4 STRL (CAST SUPPLIES) ×4
PADDING CAST COTTON 4X4 STRL (CAST SUPPLIES) ×2
PADDING CAST COTTON 6X4 STRL (CAST SUPPLIES) ×9 IMPLANT
SPLINT PLASTER CAST XFAST 5X30 (CAST SUPPLIES) ×1 IMPLANT
SPLINT PLASTER XFAST SET 5X30 (CAST SUPPLIES) ×2
SPONGE LAP 18X18 RF (DISPOSABLE) ×3 IMPLANT
STAPLER VISISTAT (STAPLE) ×3 IMPLANT
STAPLER VISISTAT 35W (STAPLE) IMPLANT
STOCKINETTE IMPERVIOUS LG (DRAPES) ×3 IMPLANT
STRIP CLOSURE SKIN 1/2X4 (GAUZE/BANDAGES/DRESSINGS) ×2 IMPLANT
SUCTION FRAZIER HANDLE 10FR (MISCELLANEOUS)
SUCTION TUBE FRAZIER 10FR DISP (MISCELLANEOUS) IMPLANT
SUT ETHILON 2 0 FS 18 (SUTURE) ×12 IMPLANT
SUT ETHILON 3 0 PS 1 (SUTURE) IMPLANT
SUT PDS AB 0 CT 36 (SUTURE) ×3 IMPLANT
SUT PDS AB 1 TP1 54 (SUTURE) ×3 IMPLANT
SUT PDS AB 2-0 CT1 27 (SUTURE) ×6 IMPLANT
SUT VIC AB 0 CT1 27 (SUTURE)
SUT VIC AB 0 CT1 27XBRD ANBCTR (SUTURE) IMPLANT
SUT VIC AB 2-0 CT1 27 (SUTURE)
SUT VIC AB 2-0 CT1 TAPERPNT 27 (SUTURE) IMPLANT
SWAB CULTURE ESWAB REG 1ML (MISCELLANEOUS) ×3 IMPLANT
SYR CONTROL 10ML LL (SYRINGE) IMPLANT
TOWEL GREEN STERILE (TOWEL DISPOSABLE) ×6 IMPLANT
TOWEL GREEN STERILE FF (TOWEL DISPOSABLE) ×6 IMPLANT
TUBE CONNECTING 12'X1/4 (SUCTIONS) ×1
TUBE CONNECTING 12X1/4 (SUCTIONS) ×2 IMPLANT
UNDERPAD 30X30 (UNDERPADS AND DIAPERS) ×3 IMPLANT
WATER STERILE IRR 1000ML POUR (IV SOLUTION) ×6 IMPLANT
YANKAUER SUCT BULB TIP NO VENT (SUCTIONS) ×3 IMPLANT

## 2020-01-16 NOTE — Transfer of Care (Signed)
Immediate Anesthesia Transfer of Care Note  Patient: Shannon Joyce  Procedure(s) Performed: removal of hardware left humerus, irrigation and debridement. (Left )  Patient Location: PACU  Anesthesia Type:General  Level of Consciousness: awake, oriented and patient cooperative  Airway & Oxygen Therapy: Patient Spontanous Breathing and Patient connected to nasal cannula oxygen  Post-op Assessment: Report given to RN and Post -op Vital signs reviewed and stable  Post vital signs: Reviewed  Last Vitals:  Vitals Value Taken Time  BP 173/89 01/16/20 1039  Temp    Pulse 103 01/16/20 1042  Resp 20 01/16/20 1042  SpO2 98 % 01/16/20 1042  Vitals shown include unvalidated device data.  Last Pain:  Vitals:   01/16/20 0726  TempSrc:   PainSc: 0-No pain      Patients Stated Pain Goal: 3 ([id number])  Complications: No apparent anesthesia complications

## 2020-01-16 NOTE — Progress Notes (Signed)
Dr. Marcelino Scot aware that consent has not been signed and that son, [name], would like to speak with him to go over procedure.  Shannon Joyce can be reached at [phone] and is expecting phone call around [phone].

## 2020-01-16 NOTE — Anesthesia Procedure Notes (Signed)
Procedure Name: Intubation Date/Time: 01/16/2020 8:47 AM Performed by: Jenne Campus, CRNA Pre-anesthesia Checklist: Patient identified, Emergency Drugs available, Suction available and Patient being monitored Patient Re-evaluated:Patient Re-evaluated prior to induction Oxygen Delivery Method: Circle System Utilized Preoxygenation: Pre-oxygenation with 100% oxygen Induction Type: IV induction Ventilation: Mask ventilation without difficulty Laryngoscope Size: Miller and 2 Grade View: Grade I Tube type: Oral Tube size: 7.0 mm Number of attempts: 1 Airway Equipment and Method: Stylet and Oral airway Placement Confirmation: ETT inserted through vocal cords under direct vision,  positive ETCO2 and breath sounds checked- equal and bilateral Secured at: 19 cm Tube secured with: Tape Dental Injury: Teeth and Oropharynx as per pre-operative assessment

## 2020-01-16 NOTE — Op Note (Signed)
NAMEAAMANI, COFFEY MEDICAL RECORD [id number] ACCOUNT [id number] DATE OF BIRTH:1925/11/27 FACILITY: [facility] LOCATION: [facility] PHYSICIAN:Lourdez Mcgahan H. Olon Russ, MD  OPERATIVE REPORT  DATE OF PROCEDURE:  01/16/2020  PREOPERATIVE DIAGNOSES: 1.  Loss of reduction left humerus, left humeral shaft and supracondylar fractures.  2.  Severe osteoporosis. 3.  Loose hardware.  POSTOPERATIVE DIAGNOSES: 1.  Loss of reduction left humerus, left humeral shaft and supracondylar fractures.  2.  Severe osteoporosis. 3.  Loose hardware.  PROCEDURES: 1.  Removal of hardware, left humerus with irrigation and debridement. 2.  Internal fixation of left humerus with suture.  SURGEON:  Altamese Peculiar, MD  ASSISTANT:  Ainsley Spinner, PA-C.  ANESTHESIA:  General.  COMPLICATIONS:  None.    IN/OUT:  300 mL crystalloid.  ESTIMATED BLOOD LOSS:  100 mL.  SPECIMENS:  Two from fracture hematoma sent to microbiology.  TOURNIQUET:  None.  DISPOSITION:  To PACU.  CONDITION:  Stable.  INDICATIONS FOR PROCEDURE:  The patient is a 86 year old largely nonambulatory female with a history of ORIF of an osteoporotic fracture that was complicated by loss of fixation and actual penetration of the incision by the bone requiring emergent  debridement and revision ORIF.  The patient was placed into a long-arm splint at that time with plans to leave that in place until return to the clinic.  However, she was subsequently seen in the emergency room without her splint and at that time,   x-rays demonstrated recurrent loss of reduction, recurrent loss of fixation and further comminution.  I discussed with the son the risks and benefits of hardware removal and further treatment in a Sarmiento cuff after initial period of long-arm  immobilization.  These risks included nonunion, loss of motion of the elbow or the shoulder, infection, nerve injury, vessel injury and multiple others.  He acknowledged these risks and provided consent to  proceed.  SUMMARY OF PROCEDURE:  The patient was taken to the operating room where general anesthesia was induced.  She did receive preoperative antibiotics with clindamycin.  The left upper extremity was prepped and draped in the usual sterile fashion after first  removing the sutures.  A chlorhexidine wash Betadine scrub and paint was performed before the draping.  Timeout was held.  The incision was reopened and the deep fracture hematoma encountered.  We did not identify any purulence; however, cultures were  sent from this deep medium.  There was still some lateral column cortex affixed to the unicortical screws distally.  All unicortical screws were removed.  Some were completely dislocated from the bone.  Proximally, there remained some fixation.  All of  the screws were removed there as well under direct visualization.  Lavage was performed.  Unfortunately, there were free cortical segments that floated out with the lavage.  Furthermore, with any motion of the elbow, there was rather considerable  anterior flexion of the distal humeral shaft.  Consequently, we did deem it indicated to proceed with further internal fixation of the left humerus, but of course, we were trying to avoid any metal and so drilled small caliber holes into these shaft  segments with the potential to be disruptive to the soft tissues and used a PDS suture to bring them together and reduced this flexion component.  Remaining closure was performed with PDS and then nylon for the skin.  There were no complications.  The  patient tolerated the procedure well.   Ainsley Spinner, PA-C, was present and assisting throughout.  We did apply a long-arm splint with  the upper arm in valgus alignment.  The patient was awakened from anesthesia and transported to the PACU in stable  condition.  PROGNOSIS:  The patient will be in the long-arm splint until return to the office in 2 weeks, at which time we anticipate transitioning her into a  Sarmiento cuff with unrestricted elbow flexion.  If edema requires, we may need to leave her in a cast or  provide a hinged elbow brace for a longer period.  Active motion of the hand is encouraged.  VN/NUANCE  D:01/16/2020 T:01/16/2020 JOB:[id number]

## 2020-01-16 NOTE — Plan of Care (Signed)
  Problem: Pain Managment: Goal: General experience of comfort will improve Outcome: Progressing   Problem: Safety: Goal: Ability to remain free from injury will improve Outcome: Progressing   

## 2020-01-16 NOTE — Brief Op Note (Signed)
01/16/2020  10:26 AM  PATIENT:  Shannon Joyce  87 y.o. female  PRE-OPERATIVE DIAGNOSIS:  left humerus peri-implant fracture, failure of orthopaedic hardware  POST-OPERATIVE DIAGNOSIS:  left humerus peri-implant fracture, failure of orthopaedic hardware  PROCEDURE:  Procedure(s): 1. Removal of hardware left humerus, irrigation and debridement. (Left) 2. Internal fixation left humerus  SURGEON:  Surgeon(s) and Role:    Altamese [place], MD - Primary  PHYSICIAN ASSISTANT: Ainsley Spinner, PA-C  ANESTHESIA:   general  I/O:  Total I/O In: 300 [IV Piggyback:300] Out: 100 [Blood:100]  SPECIMEN:  Source of Specimen:  fracture hematoma  TOURNIQUET:  * No tourniquets in log *  COMPLICATIONS: NONE  DICTATION: .Other Dictation: Dictation Number [id number]  DISPOSITION: TO PACU  CONDITION: STABLE  DELAY START OF DVT PROPHYLAXIS BECAUSE OF BLEEDING RISK: NO

## 2020-01-16 NOTE — Anesthesia Procedure Notes (Signed)
Anesthesia Regional Block: Interscalene brachial plexus block   Pre-Anesthetic Checklist: ,, timeout performed, Correct Patient, Correct Site, Correct Laterality, Correct Procedure, Correct Position, site marked, Risks and benefits discussed,  Surgical consent,  Pre-op evaluation,  At surgeon's request and post-op pain management  Laterality: Left  Prep: chloraprep       Needles:  Injection technique: Single-shot  Needle Type: Echogenic Stimulator Needle     Needle Length: 5cm  Needle Gauge: 22     Additional Needles:   Narrative:  Start time: 01/16/2020 8:03 AM End time: 01/16/2020 8:13 AM Injection made incrementally with aspirations every 5 mL.  Performed by: Personally  Anesthesiologist: Duane Boston, MD  Additional Notes: Functioning IV was confirmed and monitors applied.  A 6mm 22ga echogenic arrow stimulator was used. Sterile prep and drape,hand hygiene and sterile gloves were used.Ultrasound guidance: relevant anatomy identified, needle position confirmed, local anesthetic spread visualized around nerve(s)., vascular puncture avoided.  Image printed for medical record.  Negative aspiration and negative test dose prior to incremental administration of local anesthetic. The patient tolerated the procedure well.

## 2020-01-16 NOTE — Anesthesia Postprocedure Evaluation (Signed)
Anesthesia Post Note  Patient: Aremy Shavers  Procedure(s) Performed: removal of hardware left humerus, irrigation and debridement. (Left )     Patient location during evaluation: PACU Anesthesia Type: General Level of consciousness: sedated Pain management: pain level controlled Vital Signs Assessment: post-procedure vital signs reviewed and stable Respiratory status: spontaneous breathing and respiratory function stable Cardiovascular status: stable Postop Assessment: no apparent nausea or vomiting Anesthetic complications: no    Last Vitals:  Vitals:   01/16/20 1039 01/16/20 1055  BP: (!) 173/89 (!) 153/42  Pulse: (!) 105 92  Resp: 19 14  Temp: 36.7 C (!) 36.2 C  SpO2: 97% 98%    Last Pain:  Vitals:   01/16/20 1039  TempSrc:   PainSc: 0-No pain                 Travon Crochet DANIEL

## 2020-01-17 ENCOUNTER — Encounter: Payer: Self-pay | Admitting: *Deleted

## 2020-01-17 LAB — BASIC METABOLIC PANEL
Anion gap: 9 (ref 5–15)
BUN: 23 mg/dL (ref 8–23)
CO2: 27 mmol/L (ref 22–32)
Calcium: 8.4 mg/dL — ABNORMAL LOW (ref 8.9–10.3)
Chloride: 104 mmol/L (ref 98–111)
Creatinine, Ser: 1.36 mg/dL — ABNORMAL HIGH (ref 0.44–1.00)
GFR calc Af Amer: 39 mL/min — ABNORMAL LOW (ref >60)
GFR calc non Af Amer: 34 mL/min — ABNORMAL LOW (ref >60)
Glucose, Bld: 137 mg/dL — ABNORMAL HIGH (ref 70–99)
Potassium: 5.2 mmol/L — ABNORMAL HIGH (ref 3.5–5.1)
Sodium: 140 mmol/L (ref 135–145)

## 2020-01-17 LAB — CBC
HCT: 26.8 % — ABNORMAL LOW (ref 36.0–46.0)
Hemoglobin: 8 g/dL — ABNORMAL LOW (ref 12.0–15.0)
MCH: 32.9 pg (ref 26.0–34.0)
MCHC: 29.9 g/dL — ABNORMAL LOW (ref 30.0–36.0)
MCV: 110.3 fL — ABNORMAL HIGH (ref 80.0–100.0)
Platelets: 122 10*3/uL — ABNORMAL LOW (ref 150–400)
RBC: 2.43 MIL/uL — ABNORMAL LOW (ref 3.87–5.11)
RDW: 17.2 % — ABNORMAL HIGH (ref 11.5–15.5)
WBC: 8.8 10*3/uL (ref 4.0–10.5)
nRBC: 0 % (ref 0.0–0.2)

## 2020-01-17 LAB — HEMOGLOBIN AND HEMATOCRIT, BLOOD
HCT: 27.5 % — ABNORMAL LOW (ref 36.0–46.0)
Hemoglobin: 8.1 g/dL — ABNORMAL LOW (ref 12.0–15.0)

## 2020-01-17 LAB — URINE CULTURE

## 2020-01-17 MED ORDER — TRAMADOL 5 MG/ML ORAL SUSPENSION: 25.0000 mg | Freq: Four times a day (QID) | ORAL | Status: DC | PRN

## 2020-01-17 MED ORDER — ACETAMINOPHEN 10 MG/ML IV SOLN
1000.0000 mg | Freq: Four times a day (QID) | INTRAVENOUS | Status: AC
  Administered 2020-01-17 – 2020-01-18 (×4): 1000 mg via INTRAVENOUS
  Filled 2020-01-17 (×4): qty 100

## 2020-01-17 MED ORDER — SODIUM CHLORIDE 0.9 % IV SOLN: INTRAVENOUS | Status: DC

## 2020-01-17 MED ORDER — OXYCODONE HCL 5 MG/5ML PO SOLN
2.5000 mg | ORAL | Status: DC | PRN
  Administered 2020-01-18 – 2020-01-21 (×6): 2.5 mg via ORAL
  Filled 2020-01-17 (×6): qty 5

## 2020-01-17 MED ORDER — METOPROLOL TARTRATE 5 MG/5ML IV SOLN
2.5000 mg | Freq: Two times a day (BID) | INTRAVENOUS | Status: DC
  Administered 2020-01-17 – 2020-01-20 (×6): 2.5 mg via INTRAVENOUS
  Filled 2020-01-17 (×6): qty 5

## 2020-01-17 MED ORDER — HALOPERIDOL LACTATE 5 MG/ML IJ SOLN
0.2500 mg | Freq: Four times a day (QID) | INTRAMUSCULAR | Status: DC | PRN
  Administered 2020-01-20: 0.25 mg via INTRAVENOUS
  Filled 2020-01-17: qty 1

## 2020-01-17 MED ORDER — LACTATED RINGERS IV SOLN: INTRAVENOUS | Status: DC

## 2020-01-17 NOTE — Plan of Care (Signed)

## 2020-01-17 NOTE — Evaluation (Signed)
Physical Therapy Evaluation Patient Details Name: Shannon Joyce MRN: [id number] DOB: 13-Sep-1925 Today's Date: 01/17/2020   History of Present Illness  86 yo female s/p ORIF L humerus on 2/04 .Pt with two previous admissions, started December 27, 2019 when she was admitted for ORIF of her left humerus.  This admission was complicated with acute delirium on top of her baseline dementia as well as acute on chronic renal insufficiency.  She was subsequently discharged back to her ALF and presented back 01/04/2020 after sustaining another fall resulting in hardware failure and open fracture of her left humerus.  Returned back tofacility this time in a long-arm splint with strict instructions not to remove the splint.  X-rays show once again failure of her hardware in the left distal humerus.  It is unclear as to whether or not the patient's splint was removed at her assisted living facility or if it was removed here in the emergency department.  Is also unclear as to whether or not she sustained another fall patient PMH demenita HTN, R femur fx 01/19    Clinical Impression  Pt admitted with above diagnosis. PTA pt resided in memory care unit at [place]. On eval, she required +2 total assist bed mobility and min guard to min assist to maintain sitting balance EOB. Session limited by pt agitation/aggression. She attempted to swing at PT and also coughed up mucus and spit at PT. Pt verbally aggressive with name calling and threats. She was chewing on R hand safety mitt in attempts to remove it. Son present in room and reports sores on pt's mouth are from her chewing on LUE splint trying to pull it off while at ALF.  Pt currently with functional limitations due to the deficits listed below (see PT Problem List). Pt will benefit from skilled PT to increase their independence and safety with mobility to allow discharge to the venue listed below.       Follow Up Recommendations SNF;Supervision/Assistance - 24  hour(Son requesting [facility].)    Equipment Recommendations  Other (comment)(defer to next venue)    Recommendations for Other Services       Precautions / Restrictions Precautions Precautions: Fall;Other (comment) Precaution Comments: sling for comfort Required Braces or Orthoses: Sling Restrictions Weight Bearing Restrictions: Yes LUE Weight Bearing: Non weight bearing      Mobility  Bed Mobility Overal bed mobility: Needs Assistance Bed Mobility: Supine to Sit;Sit to Supine     Supine to sit: +2 for physical assistance;Total assist Sit to supine: +2 for physical assistance;Max assist   General bed mobility comments: helicopter method to attain sitting EOB. Increased aggression with sitting EOB.  Transfers                 General transfer comment: unsafe to attempt at this time  Ambulation/Gait                Stairs            Wheelchair Mobility    Modified Rankin (Stroke Patients Only)       Balance Overall balance assessment: Needs assistance Sitting-balance support: No upper extremity supported;Feet unsupported Sitting balance-Leahy Scale: Fair Sitting balance - Comments: min guard to min assist to maintain balance EOB. Return to supine required due to agitation/agressive behavior.  Pertinent Vitals/Pain Pain Assessment: Faces Faces Pain Scale: Hurts little more Pain Location: LUE Pain Descriptors / Indicators: Guarding Pain Intervention(s): Monitored during session;Limited activity within patient's tolerance;Repositioned;Ice applied    Home Living Family/patient expects to be discharged to:: Skilled nursing facility                 Additional Comments: Family requesting [facility]    Prior Function Level of Independence: Needs assistance   Gait / Transfers Assistance Needed: wheelchair level since initial LUE surgery in January 2021     Comments: patient is a  poor historian - unknown     Hand Dominance   Dominant Hand: Right    Extremity/Trunk Assessment   Upper Extremity Assessment Upper Extremity Assessment: Defer to OT evaluation LUE Deficits / Details: very guarded and sling positioned during session to correct position. pt tolerating ice only becuase its out of her visual field    Lower Extremity Assessment Lower Extremity Assessment: Difficult to assess due to impaired cognition    Cervical / Trunk Assessment Cervical / Trunk Assessment: Kyphotic Cervical / Trunk Exceptions: right lateral lean in bed and when sitting EOB.  Communication   Communication: HOH  Cognition Arousal/Alertness: Awake/alert Behavior During Therapy: Agitated;Impulsive Overall Cognitive Status: History of cognitive impairments - at baseline                                 General Comments: Son present during session and reports pt's personality is typically very meek, quiet and pleasant at baseline. He reports her dementia/confusion is at baseline. She resides in a memory care unit. On eval, pt highly agitated and aggressive. Pt attempting to hit therapy staff and spitting at PT as well as verbal agression. Pt also attempting to chew on R hand mitt and L sling to remove it.      General Comments General comments (skin integrity, edema, etc.): Pt pulling off O2 [place] and pulse oximeter. Unable to replace due to agitation. No respiratory distress noted.    Exercises Other Exercises Other Exercises: pt likes blue grass music and benefits from quiet environment with lights very dim to calm her. p   Assessment/Plan    PT Assessment Patient needs continued PT services  PT Problem List Decreased strength;Decreased mobility;Decreased safety awareness;Decreased knowledge of precautions;Decreased range of motion;Decreased activity tolerance;Decreased cognition;Pain;Decreased balance       PT Treatment Interventions Therapeutic  activities;Therapeutic exercise;Patient/family education;Wheelchair mobility training;Functional mobility training;Balance training;DME instruction;Neuromuscular re-education;Cognitive remediation    PT Goals (Current goals can be found in the Care Plan section)  Acute Rehab PT Goals Patient Stated Goal: to get the sling off PT Goal Formulation: With family Time For Goal Achievement: 01/31/20 Potential to Achieve Goals: Fair    Frequency Min 2X/week   Barriers to discharge        Co-evaluation PT/OT/SLP Co-Evaluation/Treatment: Yes Reason for Co-Treatment: Complexity of the patient's impairments (multi-system involvement);Necessary to address cognition/behavior during functional activity;To address functional/ADL transfers;For patient/therapist safety PT goals addressed during session: Mobility/safety with mobility;Balance OT goals addressed during session: Proper use of Adaptive equipment and DME;ADL's and self-care;Strengthening/ROM       AM-PAC PT "6 Clicks" Mobility  Outcome Measure Help needed turning from your back to your side while in a flat bed without using bedrails?: Total Help needed moving from lying on your back to sitting on the side of a flat bed without using bedrails?: Total Help needed moving to and  from a bed to a chair (including a wheelchair)?: Total Help needed standing up from a chair using your arms (e.g., wheelchair or bedside chair)?: Total Help needed to walk in hospital room?: Total Help needed climbing 3-5 steps with a railing? : Total 6 Click Score: 6    End of Session   Activity Tolerance: Treatment limited secondary to agitation Patient left: in bed;with call bell/phone within reach;with bed alarm set;with family/visitor present Nurse Communication: Mobility status PT Visit Diagnosis: Unsteadiness on feet (R26.81);Other abnormalities of gait and mobility (R26.89);Muscle weakness (generalized) (M62.81);History of falling (Z91.81)    Time:  1010-1031 PT Time Calculation (min) (ACUTE ONLY): 21 min   Charges:   PT Evaluation $PT Eval Moderate Complexity: 1 Mod          Lorrin Goodell, PT  Office # [phone] Pager [phone]   Lorriane Shire 01/17/2020, 11:59 AM

## 2020-01-17 NOTE — Evaluation (Signed)
Occupational Therapy Evaluation Patient Details Name: Shannon Joyce MRN: [id number] DOB: 1925-09-22 Today's Date: 01/17/2020    History of Present Illness 86 yo female s/p ORIF L humerus on 2/04 .Pt with two previous admissions, started December 27, 2019 when she was admitted for ORIF of her left humerus.  This admission was complicated with acute delirium on top of her baseline dementia as well as acute on chronic renal insufficiency.  She was subsequently discharged back to her ALF and presented back 01/04/2020 after sustaining another fall resulting in hardware failure and open fracture of her left humerus.  Returned back tofacility this time in a long-arm splint with strict instructions not to remove the splint.  X-rays show once again failure of her hardware in the left distal humerus.  It is unclear as to whether or not the patient's splint was removed at her assisted living facility or if it was removed here in the emergency department.  Is also unclear as to whether or not she sustained another fall patient PMH demenita HTN, R femur fx 01/19   Clinical Impression   Patient is s/p ORIF L humerus surgery resulting in functional limitations due to the deficits listed below (see OT problem list). Pt very agitated this session and requires total +2 total to come to EOB. Pt able to sit EOB min to min guard after sustained sitting. Pt unable to progress as pt became to use R UE to pull off sling, attempting to hit PT and spitting mucous at PT.  Patient will benefit from skilled OT acutely to increase independence and safety with ADLS to allow discharge SNF Son Clance Boll present and requesting [facility] for placement.     Follow Up Recommendations  SNF;Supervision/Assistance - 24 hour    Equipment Recommendations  None recommended by OT    Recommendations for Other Services Other (comment)(palliative)     Precautions / Restrictions Precautions Precautions: Fall Precaution Comments: sling for  comfort Required Braces or Orthoses: Sling Restrictions Weight Bearing Restrictions: Yes LUE Weight Bearing: Non weight bearing      Mobility Bed Mobility Overal bed mobility: Needs Assistance Bed Mobility: Supine to Sit;Sit to Supine     Supine to sit: +2 for physical assistance;Total assist Sit to supine: +2 for physical assistance;Max assist   General bed mobility comments: pt with helicopter method to bring to static sitting. pt becoming more agitated at EOB and showing increased static sitting at the same time. Pt swinging for PT with R UE. pt using R UE to pull off sling and mouth to bite at mitten when placed on R hand. pt with noted wounds at mouth and son reports that the wounds are from using her mouth to chew at brace prior to this admission  Transfers                 General transfer comment: unsafe to attempt at this time    Balance                                           ADL either performed or assessed with clinical judgement   ADL Overall ADL's : Needs assistance/impaired                                       General ADL  Comments: total      Vision Baseline Vision/History: Wears glasses Wears Glasses: At all times       Perception     Praxis      Pertinent Vitals/Pain Pain Assessment: Faces Faces Pain Scale: Hurts little more Pain Location: LUE Pain Descriptors / Indicators: Guarding Pain Intervention(s): Monitored during session;Repositioned;Ice applied     Hand Dominance Right   Extremity/Trunk Assessment Upper Extremity Assessment Upper Extremity Assessment: LUE deficits/detail LUE Deficits / Details: very guarded and sling positioned during session to correct position. pt tolerating ice only becuase its out of her visual field   Lower Extremity Assessment Lower Extremity Assessment: Defer to PT evaluation   Cervical / Trunk Assessment Cervical / Trunk Assessment: Kyphotic Cervical / Trunk  Exceptions: right lateral lean in bed and when sitting EOB.   Communication Communication Communication: HOH   Cognition Arousal/Alertness: Awake/alert Behavior During Therapy: Agitated;Impulsive Overall Cognitive Status: History of cognitive impairments - at baseline                                 General Comments: pt making very sharpe directed comments to son in an attempt to upset him. Pt attempting to hit therapy staff and using name calling as a defense throughout session. pt pulling at all things with R UE regardless of cues. pt unaware of LUE injury.    General Comments  pt pulling oxygen off and becoming more agitated with application. pt does not visually appear to be in distress. pt using R hand to remove cord from L hand. pt using R UE to remove sling x3 prior to mitten being replaced.     Exercises Other Exercises Other Exercises: pt likes blue grass music and benefits from quiet environment with lights very dim to calm her. p   Shoulder Instructions      Home Living Family/patient expects to be discharged to:: Skilled nursing facility                                 Additional Comments: Family requesting [facility]"       Prior Functioning/Environment Level of Independence: Needs assistance        Comments: patient is a poor historian - unknown        OT Problem List: Decreased strength;Decreased range of motion;Decreased activity tolerance;Impaired balance (sitting and/or standing);Impaired UE functional use;Pain;Decreased cognition;Decreased safety awareness;Decreased knowledge of use of DME or AE;Decreased knowledge of precautions      OT Treatment/Interventions: Self-care/ADL training;Patient/family education;Balance training;Therapeutic activities;Therapeutic exercise;DME and/or AE instruction;Manual therapy;Modalities;Cognitive remediation/compensation    OT Goals(Current goals can be found in the care plan section) Acute  Rehab OT Goals Patient Stated Goal: to get this damn thing off me OT Goal Formulation: Patient unable to participate in goal setting Time For Goal Achievement: 01/19/20 Potential to Achieve Goals: Fair  OT Frequency: Min 2X/week   Barriers to D/C:            Co-evaluation PT/OT/SLP Co-Evaluation/Treatment: Yes Reason for Co-Treatment: Complexity of the patient's impairments (multi-system involvement);Necessary to address cognition/behavior during functional activity;For patient/therapist safety;To address functional/ADL transfers   OT goals addressed during session: Proper use of Adaptive equipment and DME;ADL's and self-care;Strengthening/ROM      AM-PAC OT "6 Clicks" Daily Activity     Outcome Measure Help from another person eating meals?: A Lot Help from another person  taking care of personal grooming?: A Lot Help from another person toileting, which includes using toliet, bedpan, or urinal?: Total Help from another person bathing (including washing, rinsing, drying)?: Total Help from another person to put on and taking off regular upper body clothing?: Total Help from another person to put on and taking off regular lower body clothing?: Total 6 Click Score: 8   End of Session Nurse Communication: Mobility status;Precautions  Activity Tolerance: Patient tolerated treatment well Patient left: in bed;with bed alarm set;with call bell/phone within reach;with family/visitor present  OT Visit Diagnosis: Unsteadiness on feet (R26.81);Other abnormalities of gait and mobility (R26.89);Repeated falls (R29.6);History of falling (Z91.81);Pain;Other symptoms and signs involving cognitive function Pain - Right/Left: Left Pain - part of body: Arm                Time: 1010-1035 OT Time Calculation (min): 25 min Charges:  OT General Charges $OT Visit: 1 Visit OT Evaluation $OT Eval Moderate Complexity: 1 Mod   Brynn, OTR/L  Acute Rehabilitation Services Pager:  [phone] Office: [phone] .   Jeri Modena 01/17/2020, 11:17 AM

## 2020-01-17 NOTE — Progress Notes (Signed)
Orthopaedic Trauma Service Progress Note  Patient ID: Shannon Joyce MRN: [id number] DOB/AGE: 84-Sep-1926 84 y.o.  Subjective:  Sleeping on eval Appears comfortable   Discussed ESBL + urine culture with ID   Review of Systems  Unable to perform ROS: Dementia    Objective:   VITALS:   Vitals:   01/16/20 2359 01/17/20 0019 01/17/20 0440 01/17/20 0739  BP: (!) 143/42 (!) 143/42 (!) 109/43 102/79  Pulse: 86 85 88 90  Resp:   16 16  Temp: 97.6 F (36.4 C)  (!) 97.5 F (36.4 C) 97.7 F (36.5 C)  TempSrc: Oral  Oral Oral  SpO2:    94%  Weight:      Height:        Estimated body mass index is 19.75 kg/m as calculated from the following:   Height as of this encounter: 5\' 4"  (1.626 m).   Weight as of this encounter: 52.2 kg.   Intake/Output      02/04 0701 - 02/05 0700 02/05 0701 - 02/06 0700   P.O. 240 120   I.V. (mL/kg) 1226.7 (23.5)    IV Piggyback 400    Total Intake(mL/kg) 1866.7 (35.8) 120 (2.3)   Urine (mL/kg/hr) 400 (0.3) 200 (1.4)   Blood 100    Total Output 500 200   Net +1366.7 -80        Urine Occurrence 1 x      LABS  Results for orders placed or performed during the hospital encounter of 01/14/20 (from the past 24 hour(s))  CBC     Status: Abnormal   Collection Time: 01/17/20  4:12 AM  Result Value Ref Range   WBC 8.8 4.0 - 10.5 K/uL   RBC 2.43 (L) 3.87 - 5.11 MIL/uL   Hemoglobin 8.0 (L) 12.0 - 15.0 g/dL   HCT 26.8 (L) 36.0 - 46.0 %   MCV 110.3 (H) 80.0 - 100.0 fL   MCH 32.9 26.0 - 34.0 pg   MCHC 29.9 (L) 30.0 - 36.0 g/dL   RDW 17.2 (H) 11.5 - 15.5 %   Platelets 122 (L) 150 - 400 K/uL   nRBC 0.0 0.0 - 0.2 %  Basic metabolic panel     Status: Abnormal   Collection Time: 01/17/20  4:12 AM  Result Value Ref Range   Sodium 140 135 - 145 mmol/L   Potassium 5.2 (H) 3.5 - 5.1 mmol/L   Chloride 104 98 - 111 mmol/L   CO2 27 22 - 32 mmol/L   Glucose, Bld 137 (H) 70 - 99 mg/dL     BUN 23 8 - 23 mg/dL   Creatinine, Ser 1.36 (H) 0.44 - 1.00 mg/dL   Calcium 8.4 (L) 8.9 - 10.3 mg/dL   GFR calc non Af Amer 34 (L) >60 mL/min   GFR calc Af Amer 39 (L) >60 mL/min   Anion gap 9 5 - 15     PHYSICAL EXAM:  Gen: sleeping, appears comfortable  Lungs: unlabored, symmetric chest rise and fall Cardiac: reg Ext:       Left Upper Extremity   Splint, sling stable  Dressing stable proximal shoulder  Swelling improved to digits   Ext warm   Assessment/Plan: 1 Day Post-Op   Principal Problem:   Closed traumatic displaced fracture of shaft of left humerus Active Problems:  Chronic kidney disease   Vitamin D insufficiency   Chronic UTI   Dementia ([place])   Pathological fracture of humerus due to osteoporosis   Anti-infectives (From admission, onward)   Start     Dose/Rate Route Frequency Ordered Stop   01/16/20 1200  clindamycin (CLEOCIN) IVPB 600 mg     600 mg 100 mL/hr over 30 Minutes Intravenous Every 6 hours 01/16/20 1145 01/17/20 0410   01/16/20 0928  vancomycin (VANCOCIN) powder  Status:  Discontinued       As needed 01/16/20 0928 01/16/20 1036   01/16/20 0800  clindamycin (CLEOCIN) IVPB 600 mg     600 mg 100 mL/hr over 30 Minutes Intravenous To ShortStay Surgical 01/15/20 0851 01/16/20 0907    .  POD/HD#: 57  86 year old female with repeat hardware failure due to severely osteoporotic bone left humerus   -Repeat hardware failure left humerus s/p ROH and splinting              NWB L arm  No shoulder motion at this time  Sling at all times  Ok to move digits   Ice and elevate as able      - Pain management:             Scheduled Tylenol IV as pt not taking pos well (spitting out)             Minimize narcotics                         Oxycodone 2.5 mg p.o. every 4 hours as needed for severe pain   - ABL anemia/Hemodynamics            monitor   H/H later this pm   May need PRBCs   - Medical issues              Monitor             home  meds   Acute delirium   Continue with delirium precautions    As pt spitting meds out will order IV haldol 0.25 mg IV q6h prn agitation    Place on tele   - DVT/PE prophylaxis:             No pharmacologics   - ID:              + Multidrug-resistant Klebsiella on previous urine culture  Discussed with ID  Continue precautions   NO ABX if remains asymptomatic  Also dc macrobid at dc                             - Metabolic Bone Disease:             Osteoporosis   - Activity:             Delirium precautions             Up with assistance only   - FEN/GI prophylaxis/Foley/Lines:             Gentle IVF hydration                          Renal function improved look to be at baseline and are stable   Change to regular NS as K+ ever so slightly elevated               Dysphagia 3  diet             Speech therapy eval completed during previous admissions along with MBS.  Recommendations below                                      CHL IP DIET RECOMMENDATION 01/02/2020  SLP Diet Recommendations Dysphagia 3 (Mech soft) solids;Thin liquid  Liquid Administration via Cup;Straw  Medication Administration Crushed with puree  Compensations Small sips/bites  Postural Changes --      - Impediments to fracture healing:             Profound osteoporosis             Dementia   - Dispo:           continue inpatient care  [facility] team to discuss dc wishes with son     Jari Pigg, PA-C [phone] (C) 01/17/2020, 9:49 AM  [facility] [place] [address] [phone] Jenetta [phone] (F)   After 6pm on weekdays please call office number to get in touch with on call provider or refer to [place] and look to see who is on call for the Sports Medicine Call Group which is listed under orthopaedics   On Weekends please call office number to get in touch with on call provider or refer to [place] and look to see who is on call for the Sports Medicine Call  Group which is listed under orthopaedics

## 2020-01-17 NOTE — Progress Notes (Signed)
Provider notified about pt becoming increasingly combative throughout the morning. Pt spit out all her morning medications when given crushed with applesauce. Pt has been trying to pull at lines and was agitated when PT tried to work with her this morning. HR elevated, requested IV metoprolol and haldol instead of PO. New orders for IV medications and tele monitor added.

## 2020-01-17 NOTE — Plan of Care (Signed)
  Problem: Pain Managment: Goal: General experience of comfort will improve Outcome: Progressing   Problem: Safety: Goal: Ability to remain free from injury will improve Outcome: Progressing   

## 2020-01-18 LAB — CBC
HCT: 26.4 % — ABNORMAL LOW (ref 36.0–46.0)
Hemoglobin: 7.8 g/dL — ABNORMAL LOW (ref 12.0–15.0)
MCH: 33.3 pg (ref 26.0–34.0)
MCHC: 29.5 g/dL — ABNORMAL LOW (ref 30.0–36.0)
MCV: 112.8 fL — ABNORMAL HIGH (ref 80.0–100.0)
Platelets: 146 10*3/uL — ABNORMAL LOW (ref 150–400)
RBC: 2.34 MIL/uL — ABNORMAL LOW (ref 3.87–5.11)
RDW: 17.8 % — ABNORMAL HIGH (ref 11.5–15.5)
WBC: 13.2 10*3/uL — ABNORMAL HIGH (ref 4.0–10.5)
nRBC: 0 % (ref 0.0–0.2)

## 2020-01-18 LAB — BASIC METABOLIC PANEL
Anion gap: 7 (ref 5–15)
BUN: 27 mg/dL — ABNORMAL HIGH (ref 8–23)
CO2: 26 mmol/L (ref 22–32)
Calcium: 8.5 mg/dL — ABNORMAL LOW (ref 8.9–10.3)
Chloride: 107 mmol/L (ref 98–111)
Creatinine, Ser: 1.61 mg/dL — ABNORMAL HIGH (ref 0.44–1.00)
GFR calc Af Amer: 32 mL/min — ABNORMAL LOW (ref >60)
GFR calc non Af Amer: 27 mL/min — ABNORMAL LOW (ref >60)
Glucose, Bld: 100 mg/dL — ABNORMAL HIGH (ref 70–99)
Potassium: 4.9 mmol/L (ref 3.5–5.1)
Sodium: 140 mmol/L (ref 135–145)

## 2020-01-18 NOTE — Progress Notes (Signed)
Subjective: 2 Days Post-Op Procedure(s) (LRB): removal of hardware left humerus, irrigation and debridement. (Left) Patient sleeping comfortably this am    Objective: Vital signs in last 24 hours: Temp:  [97.7 F (36.5 C)-98.6 F (37 C)] 98 F (36.7 C) (02/06 0743) Pulse Rate:  [92-99] 99 (02/06 0743) Resp:  [16] 16 (02/06 0743) BP: (110-143)/(46-111) 143/99 (02/06 0743) SpO2:  [92 %-95 %] 95 % (02/06 0743)  Intake/Output from previous day: 02/05 0701 - 02/06 0700 In: 2473.3 [P.O.:680; IV Piggyback:1793.3] Out: 400 [Urine:400] Intake/Output this shift: Total I/O In: 240 [P.O.:240] Out: 300 [Urine:300]  Recent Labs    01/16/20 0456 01/17/20 0412 01/17/20 1900 01/18/20 0308  HGB 10.3* 8.0* 8.1* 7.8*   Recent Labs    01/17/20 0412 01/17/20 0412 01/17/20 1900 01/18/20 0308  WBC 8.8  --   --  13.2*  RBC 2.43*  --   --  2.34*  HCT 26.8*   < > 27.5* 26.4*  PLT 122*  --   --  146*   < > = values in this interval not displayed.   Recent Labs    01/17/20 0412 01/18/20 0308  NA 140 140  K 5.2* 4.9  CL 104 107  CO2 27 26  BUN 23 27*  CREATININE 1.36* 1.61*  GLUCOSE 137* 100*  CALCIUM 8.4* 8.5*   Recent Labs    01/16/20 0456  INR 1.1    Neurovascular intact   Assessment/Plan: 2 Days Post-Op Procedure(s) (LRB): removal of hardware left humerus, irrigation and debridement. (Left) Will continue to follow her   Hgb has drifted down to 7.8 and renal function has slightly decreased   She appears to be tolerating theses changes   Will watch conservatively     Ravleen Ries J Daviyon Widmayer 01/18/2020, 11:59 AM

## 2020-01-19 LAB — BASIC METABOLIC PANEL
Anion gap: 9 (ref 5–15)
BUN: 23 mg/dL (ref 8–23)
CO2: 26 mmol/L (ref 22–32)
Calcium: 8.4 mg/dL — ABNORMAL LOW (ref 8.9–10.3)
Chloride: 105 mmol/L (ref 98–111)
Creatinine, Ser: 1.56 mg/dL — ABNORMAL HIGH (ref 0.44–1.00)
GFR calc Af Amer: 33 mL/min — ABNORMAL LOW (ref >60)
GFR calc non Af Amer: 29 mL/min — ABNORMAL LOW (ref >60)
Glucose, Bld: 99 mg/dL (ref 70–99)
Potassium: 4.7 mmol/L (ref 3.5–5.1)
Sodium: 140 mmol/L (ref 135–145)

## 2020-01-19 NOTE — Progress Notes (Signed)
Subjective: 3 Days Post-Op Procedure(s) (LRB): removal of hardware left humerus, irrigation and debridement. (Left) Patient was asleep when evaluated this am   Objective: Vital signs in last 24 hours: Temp:  [98 F (36.7 C)-98.6 F (37 C)] 98.6 F (37 C) (02/07 1330) Pulse Rate:  [98-108] 104 (02/07 1330) Resp:  [14] 14 (02/07 1330) BP: (123-159)/(60-81) 123/81 (02/07 1330) SpO2:  [91 %-94 %] 94 % (02/07 1330)  Intake/Output from previous day: 02/06 0701 - 02/07 0700 In: 360 [P.O.:360] Out: 1700 [Urine:1700] Intake/Output this shift: No intake/output data recorded.  Recent Labs    01/17/20 0412 01/17/20 1900 01/18/20 0308  HGB 8.0* 8.1* 7.8*   Recent Labs    01/17/20 0412 01/17/20 0412 01/17/20 1900 01/18/20 0308  WBC 8.8  --   --  13.2*  RBC 2.43*  --   --  2.34*  HCT 26.8*   < > 27.5* 26.4*  PLT 122*  --   --  146*   < > = values in this interval not displayed.   Recent Labs    01/18/20 0308 01/19/20 0358  NA 140 140  K 4.9 4.7  CL 107 105  CO2 26 26  BUN 27* 23  CREATININE 1.61* 1.56*  GLUCOSE 100* 99  CALCIUM 8.5* 8.4*   No results for input(s): LABPT, INR in the last 72 hours.  Intact pulses distally   Assessment/Plan: 3 Days Post-Op Procedure(s) (LRB): removal of hardware left humerus, irrigation and debridement. (Left) Advance diet   There is a note from 01/10/2020 from Palliative care   They are discussing Hospice.  Will continue to follow conservatively   Linda Hedges 01/19/2020, 1:49 PM

## 2020-01-19 NOTE — Plan of Care (Signed)
  Problem: Pain Managment: Goal: General experience of comfort will improve Outcome: Progressing   Problem: Safety: Goal: Ability to remain free from injury will improve Outcome: Progressing   Problem: Skin Integrity: Goal: Risk for impaired skin integrity will decrease Outcome: Progressing   

## 2020-01-20 LAB — CBC
HCT: 29.2 % — ABNORMAL LOW (ref 36.0–46.0)
Hemoglobin: 8.8 g/dL — ABNORMAL LOW (ref 12.0–15.0)
MCH: 33.7 pg (ref 26.0–34.0)
MCHC: 30.1 g/dL (ref 30.0–36.0)
MCV: 111.9 fL — ABNORMAL HIGH (ref 80.0–100.0)
Platelets: 164 10*3/uL (ref 150–400)
RBC: 2.61 MIL/uL — ABNORMAL LOW (ref 3.87–5.11)
RDW: 17.8 % — ABNORMAL HIGH (ref 11.5–15.5)
WBC: 10.8 10*3/uL — ABNORMAL HIGH (ref 4.0–10.5)
nRBC: 0.2 % (ref 0.0–0.2)

## 2020-01-20 LAB — SARS CORONAVIRUS 2 (TAT 6-24 HRS): SARS Coronavirus 2: NEGATIVE

## 2020-01-20 MED ORDER — METOPROLOL SUCCINATE ER 25 MG PO TB24
12.5000 mg | ORAL_TABLET | Freq: Two times a day (BID) | ORAL | Status: DC
  Administered 2020-01-20 – 2020-01-21 (×2): 12.5 mg via ORAL
  Filled 2020-01-20 (×2): qty 1

## 2020-01-20 MED ORDER — ACETAMINOPHEN 325 MG PO TABS
650.0000 mg | ORAL_TABLET | Freq: Four times a day (QID) | ORAL | Status: DC
  Administered 2020-01-20 – 2020-01-21 (×4): 650 mg via ORAL
  Filled 2020-01-20 (×4): qty 2

## 2020-01-20 NOTE — Plan of Care (Signed)
  Problem: Pain Managment: Goal: General experience of comfort will improve Outcome: Progressing   Problem: Safety: Goal: Ability to remain free from injury will improve Outcome: Progressing   Problem: Skin Integrity: Goal: Risk for impaired skin integrity will decrease Outcome: Progressing   

## 2020-01-20 NOTE — TOC Initial Note (Signed)
Transition of Care [facility]) - Initial/Assessment Note    Patient Details  Name: Shannon Joyce MRN: [id number] Date of Birth: 1925-10-16  [facility]) CM/SW Contact:    Bartholomew Crews, RN Phone Number: [phone] 01/20/2020, 11:20 AM  Clinical Narrative:                 Spoke with patient's son, Sheka Rickards, on the phone. PTA patient has been a resident at [facility]. Discussed recommendations for SNF. Clance Boll is in agreement. Discussed faxing out patient information to area SNFs. Claude expressed interest in [facility], but stated that he has already contacted [facility] and they do not have any bed availability at this time. He asked about [facility], but made him aware that [facility] does not tend to take [facility] residents. Additional requests included [facility], [facility], and [facility]). Patient information faxed out and will follow up with Clance Boll with bed offers. Discussed with Clance Boll that he could pursue additional research of area SNFs by going to medicare.gov website and doing an area search for nursing home compare. TOC team following.   Expected Discharge Plan: Skilled Nursing Facility Barriers to Discharge: Continued Medical Work up, SNF Pending bed offer   Patient Goals and CMS Choice Patient states their goals for this hospitalization and ongoing recovery are:: skilled rehab CMS Medicare.gov Compare Post Acute Care list provided to:: Patient Represenative (must comment) Choice offered to / list presented to : Adult Children  Expected Discharge Plan and Services Expected Discharge Plan: [place]   Discharge Planning Services: CM Consult Post Acute Care Choice: [place] Living arrangements for the past 2 months: Assisted Living Facility([facility])                 DME Arranged: N/A DME Agency: NA       HH Arranged: NA [place] Agency: NA        Prior Living  Arrangements/Services Living arrangements for the past 2 months: Assisted Living Facility([facility]) Lives with:: Facility Resident Patient language and need for interpreter reviewed:: Yes        Need for Family Participation in Patient Care: Yes (Comment) Care giver support system in place?: Yes (comment)   Criminal Activity/Legal Involvement Pertinent to Current Situation/Hospitalization: No - Comment as needed  Activities of Daily Living Home Assistive Devices/Equipment: Eyeglasses ADL Screening (condition at time of admission) Patient's cognitive ability adequate to safely complete daily activities?: No Is the patient deaf or have difficulty hearing?: No Does the patient have difficulty seeing, even when wearing glasses/contacts?: No Does the patient have difficulty concentrating, remembering, or making decisions?: Yes Patient able to express need for assistance with ADLs?: Yes Does the patient have difficulty dressing or bathing?: Yes Independently performs ADLs?: No Does the patient have difficulty walking or climbing stairs?: Yes Weakness of Legs: Both Weakness of Arms/Hands: Left  Permission Sought/Granted                  Emotional Assessment Appearance:: Appears stated age     Orientation: : Oriented to Self Alcohol / Substance Use: Not Applicable Psych Involvement: No (comment)  Admission diagnosis:  Pain [R52] Closed traumatic displaced fracture of shaft of left humerus [S42.302A] Other closed fracture of shaft of left humerus, initial encounter [S42.392A] Patient Active Problem List   Diagnosis Date Noted  . Pathological fracture of humerus due to osteoporosis 01/15/2020  . Closed traumatic displaced fracture of shaft of left humerus 01/14/2020  . Chronic  UTI 01/08/2020  . Dementia ([place]) 01/08/2020  . Vitamin D insufficiency 01/07/2020  . Chronic kidney disease   . Open displaced spiral fracture of shaft of left humerus 01/05/2020  . Open left  humeral fracture 01/04/2020  . AKI (acute kidney injury) ([place])   . Displaced fracture of shaft of left humerus 12/28/2019  . Fracture of humeral shaft, left, closed 12/27/2019   PCP:  Patient, No Pcp Per Pharmacy:   [facility] [address] [place] [address] Phone: [phone] Fax: [phone]     Social Determinants of Health (SDOH) Interventions    Readmission Risk Interventions No flowsheet data found.

## 2020-01-20 NOTE — Progress Notes (Signed)
Occupational Therapy Treatment Patient Details Name: Shannon Joyce MRN: [id number] DOB: 28-Dec-1924 Today's Date: 01/20/2020    History of present illness 86 yo female with two previous admissions, started December 27, 2019 when she was admitted for ORIF of her left humerus.  This admission was complicated with acute delirium on top of her baseline dementia as well as acute on chronic renal insufficiency.  She was subsequently discharged back to her ALF and presented back 01/04/2020 after sustaining another fall resulting in hardware failure and open fracture of her left humerus.  Returned back tofacility this time in a long-arm splint with strict instructions not to remove the splint.  X-rays show once again failure of her hardware in the left distal humerus--hardware removed and long arm cast applied. It is unclear as to whether or not the patient's splint was removed at her assisted living facility or if it was removed here in the emergency department.  Is also unclear as to whether or not she sustained another fall patient PMH demenita HTN, R femur fx 01/19   OT comments  This 86 yo female has had a rough go of it in concerns of her LUE and confusion since 12/2019. Pt seen today for OOB, toilet transfers, standing, and movement of left digits. Pt still needing increased A today but able to pivot with +1 Max A and one for safety and stand with +1 Mod A(but maintains with +1 Max A), no tolerating PROM to left digits. She will continue to benefit from acute OT with follow up at SNF.  Follow Up Recommendations  SNF;Supervision/Assistance - 24 hour    Equipment Recommendations  None recommended by OT       Precautions / Restrictions Precautions Precautions: Fall;Other (comment) Precaution Comments: sling for comfort Required Braces or Orthoses: Sling Restrictions Weight Bearing Restrictions: Yes LUE Weight Bearing: Non weight bearing       Mobility Bed Mobility Overal bed mobility: Needs  Assistance Bed Mobility: Supine to Sit     Supine to sit: Max assist     General bed mobility comments: Pt moved her legs towards EOB when asked to do so but then would not try to move anymore so use bed pad under her to shift her hips around to sit EOB  Transfers Overall transfer level: Needs assistance   Transfers: Sit to/from Stand;Stand Pivot Transfers Sit to Stand: Mod assist Stand pivot transfers: Max assist;+2 safety/equipment       General transfer comment: Use of gait belt    Balance Overall balance assessment: Needs assistance Sitting-balance support: Single extremity supported;Feet supported Sitting balance-Leahy Scale: Fair     Standing balance support: Single extremity supported Standing balance-Leahy Scale: Zero Standing balance comment: Pt A with sit>stand but then does not maintain as much A while standing (goes from Mod to Max)                           ADL either performed or assessed with clinical judgement   ADL Overall ADL's : Needs assistance/impaired                         Toilet Transfer: Maximal assistance;+2 for safety/equipment;BSC;Stand-pivot   Toileting- Clothing Manipulation and Hygiene: Total assistance Toileting - Clothing Manipulation Details (indicate cue type and reason): Max A to maintains standing for peri care and clothing             Vision Baseline Vision/History: Wears  glasses Wears Glasses: At all times            Cognition Arousal/Alertness: Awake/alert Behavior During Therapy: Anxious Overall Cognitive Status: History of cognitive impairments - at baseline                                          Exercises Other Exercises Other Exercises: pt likes blue grass music and benefits from quiet environment with lights very dim to calm her. Other Exercises: Tried to get patient to move her digits of her left hand, but she would not. With PROM she c/o pain in all digits            Pertinent Vitals/ Pain       Pain Assessment: Faces Faces Pain Scale: Hurts even more Pain Location: LUE--with getting up to EOB, 3n1, recliner Pain Descriptors / Indicators: Grimacing;Guarding;Moaning Pain Intervention(s): Limited activity within patient's tolerance;Monitored during session;Repositioned(RN notified by lab tech in room (lab tech also had to speak to RN on another matter))         Frequency  Min 2X/week        Progress Toward Goals  OT Goals(current goals can now be found in the care plan section)  Progress towards OT goals: Progressing toward goals     Plan Discharge plan remains appropriate       AM-PAC OT "6 Clicks" Daily Activity     Outcome Measure   Help from another person eating meals?: A Lot Help from another person taking care of personal grooming?: A Lot Help from another person toileting, which includes using toliet, bedpan, or urinal?: Total Help from another person bathing (including washing, rinsing, drying)?: Total Help from another person to put on and taking off regular upper body clothing?: Total Help from another person to put on and taking off regular lower body clothing?: Total 6 Click Score: 8    End of Session Equipment Utilized During Treatment: Gait belt  OT Visit Diagnosis: Unsteadiness on feet (R26.81);Other abnormalities of gait and mobility (R26.89);Repeated falls (R29.6);History of falling (Z91.81);Pain;Other symptoms and signs involving cognitive function Pain - Right/Left: Left Pain - part of body: Arm   Activity Tolerance Patient tolerated treatment well   Patient Left in chair;with call bell/phone within reach;with chair alarm set   Nurse Communication (NT: pt needed a new purewick)        Time: [id number] OT Time Calculation (min): 32 min  Charges: OT General Charges $OT Visit: 1 Visit OT Treatments $Self Care/Home Management : 23-37 mins   Tye [place], OTR/L [place] Pager  [phone] Office [phone]     01/20/2020, 1:03 PM

## 2020-01-20 NOTE — TOC Progression Note (Signed)
Transition of Care [facility]) - Progression Note    Patient Details  Name: Shannon Joyce MRN: [id number] Date of Birth: 1925/11/06  [facility]) CM/SW Contact  Bartholomew Crews, RN Phone Number: [phone] 01/20/2020, 4:09 PM  Clinical Narrative:    Spoke with patient's son to discuss current bed offers. Spoke with [facility] - they can accept patient tomorrow. Son is able to complete admission paperwork tomorrow at 11am.   Patient is due for her 2nd Moderna vaccine on Thursday 2/11. [facility] will be getting their 2nd round of vaccines closer to month's end.   Patient will need covid test today in preparation for transition tomorrow.   TOC team following.    Expected Discharge Plan: [place] Barriers to Discharge: Continued Medical Work up, SNF Pending bed offer  Expected Discharge Plan and Services Expected Discharge Plan: [place]   Discharge Planning Services: CM Consult Post Acute Care Choice: [place] Living arrangements for the past 2 months: Assisted Living Facility([facility])                 DME Arranged: N/A DME Agency: NA       HH Arranged: NA HH Agency: NA         Social Determinants of Health (SDOH) Interventions    Readmission Risk Interventions No flowsheet data found.

## 2020-01-20 NOTE — Progress Notes (Signed)
Orthopaedic Trauma Service Progress Note  Patient ID: Shannon Joyce MRN: [id number] DOB/AGE: 1925-11-30 86 y.o.  Subjective:  No acute issues Son has opted for SNF Search ongoing  Pt sleeping comfortably this am   ROS As above  Objective:   VITALS:   Vitals:   01/19/20 1955 01/19/20 2232 01/20/20 0252 01/20/20 0934  BP: (!) 130/93  (!) 121/94 138/65  Pulse:  (!) 106  100  Resp:    16  Temp: 99.1 F (37.3 C)  98.2 F (36.8 C) 98.4 F (36.9 C)  TempSrc: Oral  Oral Axillary  SpO2: 95% 94% 92% 94%  Weight:      Height:        Estimated body mass index is 19.75 kg/m as calculated from the following:   Height as of this encounter: 5\' 4"  (1.626 m).   Weight as of this encounter: 52.2 kg.   Intake/Output      02/07 0701 - 02/08 0700 02/08 0701 - 02/09 0700   P.O. 220 1   I.V. (mL/kg) 608.9 (11.7)    Total Intake(mL/kg) 828.9 (15.9) 1 (0)   Urine (mL/kg/hr) 700 (0.6)    Total Output 700    Net +128.9 +1        Urine Occurrence 1 x    Stool Occurrence 1 x      LABS  No results found for this or any previous visit (from the past 24 hour(s)).   PHYSICAL EXAM:   Gen: sleeping, appears comfortable, easily aroused and remains calm  Lungs: unlabored, symmetric chest rise and fall Cardiac: reg Ext:       Left Upper Extremity              Splint, sling stable             Dressing stable proximal shoulder             Swelling to fingers expected as she is immobilized             Ext warm   Moves fingers  Proximal aspect of incision looks really good. No signs of infection    Assessment/Plan: 4 Days Post-Op   Principal Problem:   Closed traumatic displaced fracture of shaft of left humerus Active Problems:   Chronic kidney disease   Vitamin D insufficiency   Chronic UTI   Dementia (HCC)   Pathological fracture of humerus due to osteoporosis   Anti-infectives (From admission,  onward)   Start     Dose/Rate Route Frequency Ordered Stop   01/16/20 1200  clindamycin (CLEOCIN) IVPB 600 mg     600 mg 100 mL/hr over 30 Minutes Intravenous Every 6 hours 01/16/20 1145 01/17/20 0410   01/16/20 0928  vancomycin (VANCOCIN) powder  Status:  Discontinued       As needed 01/16/20 0928 01/16/20 1036   01/16/20 0800  clindamycin (CLEOCIN) IVPB 600 mg     600 mg 100 mL/hr over 30 Minutes Intravenous To [facility] 01/15/20 0851 01/16/20 0907    .  POD/HD#: [age] year old female with repeat hardware failure due to severely osteoporotic bone left humerus   -Repeat hardware failure left humerus s/p ROH and splinting              NWB L arm  No shoulder motion at this time             Sling at all times             Ok to move digits              Ice and elevate as able      - Pain management:             Scheduled Tylenol              Minimize narcotics                         Oxycodone 2.5 mg p.o. every 4 hours as needed for severe pain   - ABL anemia/Hemodynamics            monitor              H/H later this pm              May need PRBCs   - Medical issues              Monitor             home meds               Acute delirium                        appears stable   Will change back to PO meds (haldol and metoprolol)   Dc cardiac monitoring    - DVT/PE prophylaxis:             No pharmacologics   - ID:              + Multidrug-resistant Klebsiella on previous urine culture             Discussed with ID             Continue precautions              NO ABX if remains asymptomatic             Also dc macrobid at dc                             - Metabolic Bone Disease:             Osteoporosis   - Activity:             Delirium precautions             Up with assistance only   - FEN/GI prophylaxis/Foley/Lines:             Gentle IVF hydration                Dysphagia 3 diet             - Impediments to fracture healing:              Profound osteoporosis             Dementia   - Dispo:           continue inpatient care            SNF search  Will repeat COVID test once pt has bed offer      Jari Pigg, PA-C [phone] (C) 01/20/2020, 11:45 AM  [facility] [place] [address] [phone] Domingo Sep (F)   After 6pm on weekdays please call office number to get in touch with on call provider or refer to Amion and look to see who is on call for the Sports Medicine Call Group which is listed under orthopaedics   On Weekends please call office number to get in touch with on call provider or refer to Amion and look to see who is on call for the Sports Medicine Call Group which is listed under orthopaedics

## 2020-01-20 NOTE — NC FL2 (Signed)
[place] MEDICAID FL2 LEVEL OF CARE SCREENING TOOL     IDENTIFICATION  Patient Name: Shannon Joyce Birthdate: 29-Dec-1924 Sex: female Admission Date (Current Location): 01/14/2020  [facility] and [place] Number:  Herbalist and Address:  [facility] [place] [address], [place], [place] [address]      Provider Number: [id number]  Attending Physician Name and Address:  Altamese Scandia, MD  Relative Name and Phone Number:  Jhane Vanderzwaag [phone]    Current Level of Care: Hospital Recommended Level of Care: [place] Prior Approval Number:    Date Approved/Denied: 11/09/17 PASRR Number: [id number]  Discharge Plan: SNF    Current Diagnoses: Patient Active Problem List   Diagnosis Date Noted  . Pathological fracture of humerus due to osteoporosis 01/15/2020  . Closed traumatic displaced fracture of shaft of left humerus 01/14/2020  . Chronic UTI 01/08/2020  . Dementia ([place]) 01/08/2020  . Vitamin D insufficiency 01/07/2020  . Chronic kidney disease   . Open displaced spiral fracture of shaft of left humerus 01/05/2020  . Open left humeral fracture 01/04/2020  . AKI (acute kidney injury) ([place])   . Displaced fracture of shaft of left humerus 12/28/2019  . Fracture of humeral shaft, left, closed 12/27/2019    Orientation RESPIRATION BLADDER Height & Weight     Self  Normal Incontinent Weight: 52.2 kg Height:  5\' 4"  (162.6 cm)  BEHAVIORAL SYMPTOMS/MOOD NEUROLOGICAL BOWEL NUTRITION STATUS      Continent Diet  AMBULATORY STATUS COMMUNICATION OF NEEDS Skin   Extensive Assist Verbally Other (Comment)(echymosis to arms, back, abdomen)                       Personal Care Assistance Level of Assistance  Bathing, Feeding, Dressing Bathing Assistance: Maximum assistance Feeding assistance: Maximum assistance Dressing Assistance: Maximum assistance     Functional Limitations Info  Sight, Speech, Hearing Sight Info:  Adequate Hearing Info: Adequate Speech Info: Impaired    SPECIAL CARE FACTORS FREQUENCY  PT (By licensed PT), OT (By licensed OT)     PT Frequency: PT at SNF to eval and treat a min of 5x/week OT Frequency: OT at SNF to eval and treat a min of 5x/week            Contractures Contractures Info: Not present    Additional Factors Info  Code Status Code Status Info: DNR Allergies Info: Clinoril Sulindac, Darvon Propoxyphene, Levaquin IV, Meropenem, Penicillin, Sulfa, Defadroxil, Gentamicin           Current Medications (01/20/2020):  This is the current hospital active medication list Current Facility-Administered Medications  Medication Dose Route Frequency Provider Last Rate Last Admin  . 0.9 %  sodium chloride infusion   Intravenous Continuous Ainsley Spinner, PA-C 50 mL/hr at 01/20/20 0153 New Bag at 01/20/20 0153  . ascorbic acid (VITAMIN C) tablet 500 mg  500 mg Oral Daily Ainsley Spinner, PA-C   500 mg at 01/20/20 0940  . calcium citrate (CALCITRATE - dosed in mg elemental calcium) tablet 200 mg of elemental calcium  200 mg of elemental calcium Oral BID Ainsley Spinner, PA-C   200 mg of elemental calcium at 01/20/20 0940  . Chlorhexidine Gluconate Cloth 2 % PADS 6 each  6 each Topical Daily Ainsley Spinner, PA-C   6 each at 01/20/20 0945  . cholecalciferol (VITAMIN D3) tablet 2,000 Units  2,000 Units Oral BID Ainsley Spinner, PA-C   2,000 Units at 01/20/20 0940  .  docusate sodium (COLACE) capsule 100 mg  100 mg Oral BID Ainsley Spinner, PA-C   100 mg at 01/20/20 0940  . feeding supplement (ENSURE ENLIVE) (ENSURE ENLIVE) liquid 237 mL  237 mL Oral TID BM Ainsley Spinner, PA-C   237 mL at 01/19/20 2123  . haloperidol (HALDOL) tablet 0.5 mg  0.5 mg Oral Q6H PRN Ainsley Spinner, PA-C   0.5 mg at 01/15/20 2220  . haloperidol lactate (HALDOL) injection 0.25 mg  0.25 mg Intravenous Q6H PRN Ainsley Spinner, PA-C   0.25 mg at 01/20/20 0159  . levothyroxine (SYNTHROID) tablet 12.5 mcg  12.5 mcg Oral Daily Ainsley Spinner,  PA-C   12.5 mcg at 01/20/20 [id number]  . metoCLOPramide (REGLAN) tablet 5-10 mg  5-10 mg Oral Q8H PRN Ainsley Spinner, PA-C       Or  . metoCLOPramide (REGLAN) injection 5-10 mg  5-10 mg Intravenous Q8H PRN Ainsley Spinner, PA-C      . metoprolol tartrate (LOPRESSOR) injection 2.5 mg  2.5 mg Intravenous Q12H Ainsley Spinner, PA-C   2.5 mg at 01/20/20 0936  . mirtazapine (REMERON) tablet 7.5 mg  7.5 mg Oral QHS Ainsley Spinner, PA-C   7.5 mg at 01/19/20 2116  . ondansetron (ZOFRAN) tablet 4 mg  4 mg Oral Q6H PRN Ainsley Spinner, PA-C       Or  . ondansetron [facility]) injection 4 mg  4 mg Intravenous Q6H PRN Ainsley Spinner, PA-C      . oxyCODONE (ROXICODONE) 5 MG/5ML solution 2.5 mg  2.5 mg Oral Q4H PRN Ainsley Spinner, PA-C   2.5 mg at 01/20/20 0243  . pantoprazole (PROTONIX) EC tablet 40 mg  40 mg Oral Daily Ainsley Spinner, PA-C   40 mg at 01/20/20 0940  . sodium chloride flush (NS) 0.9 % injection 10-40 mL  10-40 mL Intracatheter PRN Ainsley Spinner, PA-C         Discharge Medications: Please see discharge summary for a list of discharge medications.  Relevant Imaging Results:  Relevant Lab Results:   Additional Information ss# [id number]  Bartholomew Crews, RN

## 2020-01-21 LAB — RENAL FUNCTION PANEL
Albumin: 2 g/dL — ABNORMAL LOW (ref 3.5–5.0)
Anion gap: 8 (ref 5–15)
BUN: 17 mg/dL (ref 8–23)
CO2: 27 mmol/L (ref 22–32)
Calcium: 8.4 mg/dL — ABNORMAL LOW (ref 8.9–10.3)
Chloride: 104 mmol/L (ref 98–111)
Creatinine, Ser: 1.5 mg/dL — ABNORMAL HIGH (ref 0.44–1.00)
GFR calc Af Amer: 35 mL/min — ABNORMAL LOW (ref >60)
GFR calc non Af Amer: 30 mL/min — ABNORMAL LOW (ref >60)
Glucose, Bld: 111 mg/dL — ABNORMAL HIGH (ref 70–99)
Phosphorus: 2.7 mg/dL (ref 2.5–4.6)
Potassium: 4.3 mmol/L (ref 3.5–5.1)
Sodium: 139 mmol/L (ref 135–145)

## 2020-01-21 LAB — AEROBIC/ANAEROBIC CULTURE W GRAM STAIN (SURGICAL/DEEP WOUND): Gram Stain: NONE SEEN

## 2020-01-21 LAB — CBC
HCT: 27.4 % — ABNORMAL LOW (ref 36.0–46.0)
Hemoglobin: 8.2 g/dL — ABNORMAL LOW (ref 12.0–15.0)
MCH: 33.7 pg (ref 26.0–34.0)
MCHC: 29.9 g/dL — ABNORMAL LOW (ref 30.0–36.0)
MCV: 112.8 fL — ABNORMAL HIGH (ref 80.0–100.0)
Platelets: 144 10*3/uL — ABNORMAL LOW (ref 150–400)
RBC: 2.43 MIL/uL — ABNORMAL LOW (ref 3.87–5.11)
RDW: 17.8 % — ABNORMAL HIGH (ref 11.5–15.5)
WBC: 7.5 10*3/uL (ref 4.0–10.5)
nRBC: 0 % (ref 0.0–0.2)

## 2020-01-21 MED ORDER — OXYCODONE HCL 5 MG PO TABS: 2.5000 mg | ORAL_TABLET | Freq: Three times a day (TID) | ORAL | 0 refills | Status: DC | PRN

## 2020-01-21 MED ORDER — ACETAMINOPHEN ER 650 MG PO TBCR: 650.0000 mg | EXTENDED_RELEASE_TABLET | Freq: Three times a day (TID) | ORAL | 1 refills | Status: DC

## 2020-01-21 MED ORDER — ACETAMINOPHEN ER 650 MG PO TBCR: 650.0000 mg | EXTENDED_RELEASE_TABLET | Freq: Three times a day (TID) | ORAL | 1 refills | Status: AC

## 2020-01-21 MED ORDER — DOXYCYCLINE HYCLATE 100 MG PO TABS
100.0000 mg | ORAL_TABLET | Freq: Two times a day (BID) | ORAL | Status: DC
  Administered 2020-01-21: 100 mg via ORAL
  Filled 2020-01-21: qty 1

## 2020-01-21 MED ORDER — OXYCODONE HCL 5 MG PO TABS: 2.5000 mg | ORAL_TABLET | Freq: Three times a day (TID) | ORAL | 0 refills | Status: AC | PRN

## 2020-01-21 MED ORDER — DOXYCYCLINE HYCLATE 100 MG PO TABS: 100.0000 mg | ORAL_TABLET | Freq: Two times a day (BID) | ORAL | 0 refills | Status: AC

## 2020-01-21 NOTE — Progress Notes (Signed)
Called report to Nia LPN at [facility]. PTAR picking up patient. Patient alert and verbal,. No complaints at this time. Personal belongings packed. PICC line d/c'd.

## 2020-01-21 NOTE — Discharge Instructions (Signed)
Orthopaedic Trauma Service Discharge Instructions   General Discharge Instructions  Orthopaedic Injuries:  Left humerus fracture with loss of reduction.  Removal of hardware and splinting   WEIGHT BEARING STATUS: Nonweightbearing left upper extremity   RANGE OF MOTION/ACTIVITY: ok to move fingers on left hand to help with swelling.  No shoulder motion.  Do not remove splint or sling.  Check skin throughout the day to evaluate for areas of pressure   Wound Care: DO NOT REMOVE SPLINT.  Keep sling in place    Diet: as you were eating previously.  Can use over the counter stool softeners and bowel preparations, such as Miralax, to help with bowel movements.  Narcotics can be constipating.  Be sure to drink plenty of fluids  PAIN MEDICATION USE AND EXPECTATIONS  You have likely been given narcotic medications to help control your pain.  After a traumatic event that results in an fracture (broken bone) with or without surgery, it is ok to use narcotic pain medications to help control one's pain.  We understand that everyone responds to pain differently and each individual patient will be evaluated on a regular basis for the continued need for narcotic medications. Ideally, narcotic medication use should last no more than 6-8 weeks (coinciding with fracture healing).   As a patient it is your responsibility as well to monitor narcotic medication use and report the amount and frequency you use these medications when you come to your office visit.   We would also advise that if you are using narcotic medications, you should take a dose prior to therapy to maximize you participation.  IF YOU ARE ON NARCOTIC MEDICATIONS IT IS NOT PERMISSIBLE TO OPERATE A MOTOR VEHICLE (MOTORCYCLE/CAR/TRUCK/MOPED) OR HEAVY MACHINERY DO NOT MIX NARCOTICS WITH OTHER CNS (CENTRAL NERVOUS SYSTEM) DEPRESSANTS SUCH AS ALCOHOL   STOP SMOKING OR USING NICOTINE PRODUCTS!!!!  As discussed nicotine severely impairs your  body's ability to heal surgical and traumatic wounds but also impairs bone healing.  Wounds and bone heal by forming microscopic blood vessels (angiogenesis) and nicotine is a vasoconstrictor (essentially, shrinks blood vessels).  Therefore, if vasoconstriction occurs to these microscopic blood vessels they essentially disappear and are unable to deliver necessary nutrients to the healing tissue.  This is one modifiable factor that you can do to dramatically increase your chances of healing your injury.    (This means no smoking, no nicotine gum, patches, etc)  DO NOT USE NONSTEROIDAL ANTI-INFLAMMATORY DRUGS (NSAID'S)  Using products such as Advil (ibuprofen), Aleve (naproxen), Motrin (ibuprofen) for additional pain control during fracture healing can delay and/or prevent the healing response.  If you would like to take over the counter (OTC) medication, Tylenol (acetaminophen) is ok.  However, some narcotic medications that are given for pain control contain acetaminophen as well. Therefore, you should not exceed more than 4000 mg of tylenol in a day if you do not have liver disease.  Also note that there are may OTC medicines, such as cold medicines and allergy medicines that my contain tylenol as well.  If you have any questions about medications and/or interactions please ask your doctor/PA or your pharmacist.      ICE AND ELEVATE INJURED/OPERATIVE EXTREMITY  Using ice and elevating the injured extremity above your heart can help with swelling and pain control.  Icing in a pulsatile fashion, such as 20 minutes on and 20 minutes off, can be followed.    Do not place ice directly on skin. Make sure there is  a barrier between to skin and the ice pack.    Using frozen items such as frozen peas works well as the conform nicely to the are that needs to be iced.  USE AN ACE WRAP OR TED HOSE FOR SWELLING CONTROL  In addition to icing and elevation, Ace wraps or TED hose are used to help limit and resolve  swelling.  It is recommended to use Ace wraps or TED hose until you are informed to stop.    When using Ace Wraps start the wrapping distally (farthest away from the body) and wrap proximally (closer to the body)   Example: If you had surgery on your leg or thing and you do not have a splint on, start the ace wrap at the toes and work your way up to the thigh        If you had surgery on your upper extremity and do not have a splint on, start the ace wrap at your fingers and work your way up to the upper arm  IF YOU ARE IN A SPLINT OR CAST DO NOT [place]   If your splint gets wet for any reason please contact the office immediately. You may shower in your splint or cast as long as you keep it dry.  This can be done by wrapping in a cast cover or garbage back (or similar)  Do Not stick any thing down your splint or cast such as pencils, money, or hangers to try and scratch yourself with.  If you feel itchy take benadryl as prescribed on the bottle for itching  IF YOU ARE IN A CAM BOOT (BLACK BOOT)  You may remove boot periodically. Perform daily dressing changes as noted below.  Wash the liner of the boot regularly and wear a sock when wearing the boot. It is recommended that you sleep in the boot until told otherwise    Call office for the following:  Temperature greater than 101F  Persistent nausea and vomiting  Severe uncontrolled pain  Redness, tenderness, or signs of infection (pain, swelling, redness, odor or green/yellow discharge around the site)  Difficulty breathing, headache or visual disturbances  Hives  Persistent dizziness or light-headedness  Extreme fatigue  Any other questions or concerns you may have after discharge  In an emergency, call 911 or go to an Emergency Department at a nearby hospital    [place]: [phone]   VISIT OUR WEBSITE FOR ADDITIONAL INFORMATION: [email or web address]

## 2020-01-21 NOTE — Progress Notes (Addendum)
Physical Therapy Treatment Patient Details Name: Shannon Joyce MRN: [id number] DOB: 01/12/1925 Today's Date: 01/21/2020    History of Present Illness 86 yo female s/p ORIF L humerus on 2/04 .Pt with two previous admissions, started December 27, 2019 when she was admitted for ORIF of her left humerus.  This admission was complicated with acute delirium on top of her baseline dementia as well as acute on chronic renal insufficiency.  She was subsequently discharged back to her ALF and presented back 01/04/2020 after sustaining another fall resulting in hardware failure and open fracture of her left humerus.  Returned back tofacility this time in a long-arm splint with strict instructions not to remove the splint.  X-rays show once again failure of her hardware in the left distal humerus--hardware removed and long arm cast applied. It is unclear as to whether or not the patient's splint was removed at her assisted living facility or if it was removed here in the emergency department.  Is also unclear as to whether or not she sustained another fall patient PMH demenita HTN, R femur fx 01/19    PT Comments    Pt pleasant throughout session with ability to transition to EOB, stand and pivot with max assist with +2 for safety. PT followed commands to perform bil LE seated HEP and D/C plan remains appropriate. Pt with purewick and mesh underwear on arrival with noted leaking purewick  with labia, inguinal folds and entire perirectal region red with wet garments removed and nursing staff RN and NT made aware.    Follow Up Recommendations  SNF;Supervision/Assistance - 24 hour     Equipment Recommendations  None recommended by PT    Recommendations for Other Services       Precautions / Restrictions Precautions Precautions: Fall;Other (comment) Required Braces or Orthoses: Sling Restrictions Weight Bearing Restrictions: Yes LUE Weight Bearing: Non weight bearing    Mobility  Bed Mobility Overal bed  mobility: Needs Assistance Bed Mobility: Supine to Sit     Supine to sit: Max assist;HOB elevated     General bed mobility comments: HOB 35 degrees with physical assist to move legs toward EOB, pivot pelvis and elevate trunk from surface  Transfers Overall transfer level: Needs assistance   Transfers: Sit to/from Stand;Stand Pivot Transfers Sit to Stand: Max assist;+2 safety/equipment Stand pivot transfers: Max assist;+2 safety/equipment       General transfer comment: Use of gait belt with RUE hooked with therapist to assist with standing from bed x 2 trials and pivot toward right with +2 for safety and assist to control pelvis. Total +2 to slide back in chair  Ambulation/Gait             General Gait Details: unable   Stairs             Wheelchair Mobility    Modified Rankin (Stroke Patients Only)       Balance Overall balance assessment: Needs assistance Sitting-balance support: Single extremity supported;Feet supported Sitting balance-Leahy Scale: Fair Sitting balance - Comments: minguard for sitting EOB   Standing balance support: Single extremity supported Standing balance-Leahy Scale: Zero                              Cognition Arousal/Alertness: Awake/alert Behavior During Therapy: Flat affect Overall Cognitive Status: History of cognitive impairments - at baseline Area of Impairment: Following commands;Safety/judgement;Problem solving  Orientation Level: Disoriented to;Place;Time;Situation   Memory: Decreased short-term memory Following Commands: Follows one step commands consistently Safety/Judgement: Decreased awareness of safety;Decreased awareness of deficits   Problem Solving: Decreased initiation;Difficulty sequencing;Requires verbal cues;Requires tactile cues General Comments: pt pleasant and assisting with mobility with RUE but not oriented      Exercises General Exercises - Lower  Extremity Long Arc Quad: AROM;Both;Seated;15 reps Hip ABduction/ADduction: Both;Seated;AROM;15 reps Hip Flexion/Marching: AROM;AAROM;Right;Left;Seated;15 reps(AAROM on LLE)    General Comments        Pertinent Vitals/Pain Pain Assessment: (PAINAD = 2) Pain Location: grimace with initial rise to sitting and movement of LUE Pain Intervention(s): Limited activity within patient's tolerance;Monitored during session;Repositioned    Home Living                      Prior Function            PT Goals (current goals can now be found in the care plan section) Progress towards PT goals: Progressing toward goals    Frequency    Min 2X/week      PT Plan Current plan remains appropriate    Co-evaluation              AM-PAC PT "6 Clicks" Mobility   Outcome Measure  Help needed turning from your back to your side while in a flat bed without using bedrails?: Total Help needed moving from lying on your back to sitting on the side of a flat bed without using bedrails?: Total Help needed moving to and from a bed to a chair (including a wheelchair)?: Total Help needed standing up from a chair using your arms (e.g., wheelchair or bedside chair)?: Total Help needed to walk in hospital room?: Total Help needed climbing 3-5 steps with a railing? : Total 6 Click Score: 6    End of Session Equipment Utilized During Treatment: Gait belt Activity Tolerance: Patient tolerated treatment well Patient left: in chair;with call bell/phone within reach;with chair alarm set Nurse Communication: Mobility status;Precautions;Weight bearing status PT Visit Diagnosis: Unsteadiness on feet (R26.81);Other abnormalities of gait and mobility (R26.89);Muscle weakness (generalized) (M62.81);History of falling (Z91.81)     Time: [id number] PT Time Calculation (min) (ACUTE ONLY): 21 min  Charges:  $Therapeutic Activity: 8-22 mins                     Kenyan Karnes P, PT Acute Rehabilitation  Services Pager: [phone] Office: [place] 01/21/2020, 1:44 PM

## 2020-01-21 NOTE — Discharge Summary (Addendum)
Orthopaedic Trauma Service (OTS) Discharge Summary   Patient ID: Zipora Vaneps MRN: [id number] DOB/AGE: [birth year] 86 y.o.  Admit date: 01/14/2020 Discharge date: 01/21/2020  Admission Diagnoses: Closed left humerus fracture Pathologic fracture left humerus due to osteoporosis Disruption of orthopedic implant left humerus/loss of reduction Left humerus  Chronic kidney disease Chronic UTI Dementia   Discharge Diagnoses:  Principal Problem:   Closed traumatic displaced fracture of shaft of left humerus Active Problems:   Chronic kidney disease, Stage 3B   Vitamin D insufficiency   Chronic UTI   Dementia (HCC)   Pathological fracture of humerus due to osteoporosis   Past Medical History:  Diagnosis Date   Chronic kidney disease    Chronic UTI 01/08/2020   Dementia ([place])    Dementia ([place]) 01/08/2020   Hypertension    Pathological fracture of humerus due to osteoporosis 01/15/2020   UTI (lower urinary tract infection)    Vitamin D insufficiency 01/07/2020     Procedures Performed: 01/16/2020- Dr. Marcelino Scot 1.  Removal of hardware, left humerus with irrigation and debridement. 2.  Internal fixation of left humerus with suture.    Discharged Condition: stable  Hospital Course:  86 year old female with dementia well-known to the orthopedic trauma service for repair of left humerus fracture following hardware failure sustained another incident to her left arm on 01/14/2020.  The exact circumstances are unknown but she was brought from her assisted living facility with increasing pain to her left arm.  Is unclear if patient I removed her splint while at the facility or if it was removed by another agent.  Irrespective of this it was found that she had hardware failure again with complete pullout of the distal cluster of screws in her distal humerus.  Patient was taken once again to the OR on 01/16/2020 where removal of hardware was performed.  Due to her advanced osteoporosis as well as  extreme comminution we felt that no additional hardware can be safely placed to provide any benefit.  As such patient was splinted and will continue with splinting and fracture bracing.  Patient's hospital stay was really uncomplicated.  She did require placement of a PICC line as she had very poor peripheral access.  This was done without incident.  Patient continued to progress well during her hospital stay.  Pain seemingly well controlled with Tylenol and very low-dose OxyIR.  After further discussions with the son thought that skilled nursing facility would be the best option for her.  Ultimately on postoperative day #5 patient was deemed stable for discharge to skilled nursing facility.  Patient was covered with clindamycin for perioperative antibiotic coverage given her numerous allergies.  We did obtain intraoperative cultures which did ultimately grow out some staph epidermidis.  Given that she did have an open fracture we will treat her for 10 days of oral doxycycline as this is one of the few antibiotics she does not have a allergy to.  During her hospital stay I did discuss her chronic UTIs with the infectious disease service as she did grow out some EBSL Klebsiella during her last admission.  They strongly advocated no treatment as patient was asymptomatic.  They also recommended discontinuing her chronic neck  Patient discharged in stable condition on 01/21/2020.  She will follow-up with orthopedic trauma service in 2 weeks.  She will remain in her splint until that time.  At no point time should her splint be removed.  I did actually place a thigh-high TED hose over her splint  prior to departure to prevent her from picking at the Ace wrap which she appears to be doing on a frequent basis.  I am hopeful that she will not be able to get a grip on the TED hose to peel her splint off.  I did cut the TED hose so that her fingers are exposed and she can still move her digits and her skin can be assessed.   The TED hose is not too tight either and there is significant on the padding from her splint present as well.  Her sling should remain on at all times as well.  Periodic skin check should be weighed as well throughout the day.  Consults: None  Significant Diagnostic Studies: labs:   Results for HELA, BRUEGGEMAN (MRN [id number]) as of 01/21/2020 09:50  Ref. Range 01/21/2020 05:00  Sodium Latest Ref Range: 135 - 145 mmol/L 139  Potassium Latest Ref Range: 3.5 - 5.1 mmol/L 4.3  Chloride Latest Ref Range: 98 - 111 mmol/L 104  CO2 Latest Ref Range: 22 - 32 mmol/L 27  Glucose Latest Ref Range: 70 - 99 mg/dL 111 (H)  BUN Latest Ref Range: 8 - 23 mg/dL 17  Creatinine Latest Ref Range: 0.44 - 1.00 mg/dL 1.50 (H)  Calcium Latest Ref Range: 8.9 - 10.3 mg/dL 8.4 (L)  Anion gap Latest Ref Range: 5 - 15  8  Phosphorus Latest Ref Range: 2.5 - 4.6 mg/dL 2.7  Albumin Latest Ref Range: 3.5 - 5.0 g/dL 2.0 (L)  GFR, Est Non African American Latest Ref Range: >60 mL/min 30 (L)  GFR, Est African American Latest Ref Range: >60 mL/min 35 (L)  WBC Latest Ref Range: 4.0 - 10.5 K/uL 7.5  RBC Latest Ref Range: 3.87 - 5.11 MIL/uL 2.43 (L)  Hemoglobin Latest Ref Range: 12.0 - 15.0 g/dL 8.2 (L)  HCT Latest Ref Range: 36.0 - 46.0 % 27.4 (L)  MCV Latest Ref Range: 80.0 - 100.0 fL 112.8 (H)  MCH Latest Ref Range: 26.0 - 34.0 pg 33.7  MCHC Latest Ref Range: 30.0 - 36.0 g/dL 29.9 (L)  RDW Latest Ref Range: 11.5 - 15.5 % 17.8 (H)  Platelets Latest Ref Range: 150 - 400 K/uL 144 (L)  nRBC Latest Ref Range: 0.0 - 0.2 % 0.0    Status:  Final result   Visible to patient:  No (inaccessible in MyChart) Next appt:  None Specimen Information: Nasopharyngeal Swab       Ref Range & Units 1 d ago  (01/20/20) 7 d ago  (01/14/20) 2 wk ago  (01/06/20) 2 wk ago  (01/01/20)  SARS Coronavirus 2 NEGATIVE NEGATIVE  NEGATIVE CM  NEGATIVE CM  NEGATIVE CM   Comment: (NOTE)  SARS-CoV-2 target nucleic acids are NOT DETECTED.  The SARS-CoV-2 RNA is  generally detectable in upper and lower  respiratory specimens during the acute phase of infection. Negative  results do not preclude SARS-CoV-2 infection, do not rule out  co-infections with other pathogens, and should not be used as the  sole basis for treatment or other patient management decisions.  Negative results must be combined with clinical observations,  patient history, and epidemiological information. The expected  result is Negative.  Fact Sheet for Patients:  SugarRoll.be  Fact Sheet for Healthcare Providers:  https://www.woods-mathews.com/  This test is not yet approved or cleared by the [place] FDA and  has been authorized for detection and/or diagnosis of SARS-CoV-2 by  FDA under an Emergency Use Authorization (EUA). This EUA will remain  in effect (meaning this test can be used) for the duration of the  COVID-19 declaration under Section 564(b)(1) of the Act, 21 U.S.C.  section 360bbb-3(b)(1), unless the authorization is terminated or  revoked sooner.  Performed at [facility], [place] [address] [place], [place]  [address]          Treatments: IV hydration, antibiotics: clindamycin, analgesia: acetaminophen and oxy ir, therapies: PT, OT, RN and SW, procedures: PICC line due to poor peripheral access and surgery: as above  Discharge Exam:                                Orthopaedic Trauma Service Progress Note   Patient ID: Takeena Herberg MRN: [id number] DOB/AGE: Mar 26, 1925 86 y.o.   Subjective:   No acute events overnight Son states pt is picking at splint again Bed offer at [facility]    Repeat covid test is negative (01/20/2020)   Intra-op cultures show staph epi   ROS As a Objective:    VITALS:         Vitals:    01/20/20 0934 01/20/20 2018 01/21/20 0442 01/21/20 0802  BP: 138/65 (!) 141/83 (!) 133/54 138/67  Pulse: 100 (!) 106 87 90  Resp: 16 18   16   Temp: 98.4 F (36.9 C) 98.1 F (36.7 C)  98.7 F (37.1 C) 98.4 F (36.9 C)  TempSrc: Axillary   Oral Oral  SpO2: 94% 96% 97% 97%  Weight:          Height:              Estimated body mass index is 19.75 kg/m as calculated from the following:   Height as of this encounter: 5\' 4"  (1.626 m).   Weight as of this encounter: 52.2 kg.     Intake/Output      02/08 0701 - 02/09 0700 02/09 0701 - 02/10 0700   P.O. 1 240   I.V. (mL/kg)     Total Intake(mL/kg) 1 (0) 240 (4.6)   Urine (mL/kg/hr) 350 (0.3) 400 (1.9)   Total Output 350 400   Net -349 -160           LABS   Lab Results Last 24 Hours       Results for orders placed or performed during the hospital encounter of 01/14/20 (from the past 24 hour(s))  CBC     Status: Abnormal    Collection Time: 01/20/20 12:30 PM  Result Value Ref Range    WBC 10.8 (H) 4.0 - 10.5 K/uL    RBC 2.61 (L) 3.87 - 5.11 MIL/uL    Hemoglobin 8.8 (L) 12.0 - 15.0 g/dL    HCT 29.2 (L) 36.0 - 46.0 %    MCV 111.9 (H) 80.0 - 100.0 fL    MCH 33.7 26.0 - 34.0 pg    MCHC 30.1 30.0 - 36.0 g/dL    RDW 17.8 (H) 11.5 - 15.5 %    Platelets 164 150 - 400 K/uL    nRBC 0.2 0.0 - 0.2 %  SARS CORONAVIRUS 2 (TAT 6-24 HRS) Nasopharyngeal Nasopharyngeal Swab     Status: None    Collection Time: 01/20/20  5:22 PM    Specimen: Nasopharyngeal Swab  Result Value Ref Range    SARS Coronavirus 2 NEGATIVE NEGATIVE  CBC     Status: Abnormal    Collection Time: 01/21/20  5:00 AM  Result Value Ref Range  WBC 7.5 4.0 - 10.5 K/uL    RBC 2.43 (L) 3.87 - 5.11 MIL/uL    Hemoglobin 8.2 (L) 12.0 - 15.0 g/dL    HCT 27.4 (L) 36.0 - 46.0 %    MCV 112.8 (H) 80.0 - 100.0 fL    MCH 33.7 26.0 - 34.0 pg    MCHC 29.9 (L) 30.0 - 36.0 g/dL    RDW 17.8 (H) 11.5 - 15.5 %    Platelets 144 (L) 150 - 400 K/uL    nRBC 0.0 0.0 - 0.2 %  Renal function panel     Status: Abnormal    Collection Time: 01/21/20  5:00 AM  Result Value Ref Range    Sodium 139 135 - 145 mmol/L    Potassium 4.3 3.5 - 5.1 mmol/L    Chloride 104 98 - 111  mmol/L    CO2 27 22 - 32 mmol/L    Glucose, Bld 111 (H) 70 - 99 mg/dL    BUN 17 8 - 23 mg/dL    Creatinine, Ser 1.50 (H) 0.44 - 1.00 mg/dL    Calcium 8.4 (L) 8.9 - 10.3 mg/dL    Phosphorus 2.7 2.5 - 4.6 mg/dL    Albumin 2.0 (L) 3.5 - 5.0 g/dL    GFR calc non Af Amer 30 (L) >60 mL/min    GFR calc Af Amer 35 (L) >60 mL/min    Anion gap 8 5 - 15          PHYSICAL EXAM:    Gen: sitting in bed side chair, appears comfortabl Lungs: unlabored Cardiac:regular  Ext:       Left Upper Extremity              Splint c/d/i             Ace stable             Swelling to digits stable             Motor functions grossly intact             Ext warm                                       In an effort to prevent the pt from picking at ace wrap and removing the splint I use a thigh high ted hose and placed it over the ace wrap to prevent the pt from picking at the ace.  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                Anti-infectives (From admission, onward)      Start     Dose/Rate Route Frequency Ordered Stop    01/21/20 1100   doxycycline (VIBRA-TABS) tablet 100 mg     100 mg Oral Every 12 hours 01/21/20 1054 01/31/20 0959    01/21/20 0000   doxycycline (VIBRA-TABS) 100 MG tablet     100 mg Oral Every 12 hours 01/21/20 1055 01/31/20 2359    01/16/20 1200   clindamycin (CLEOCIN) IVPB 600 mg  600 mg 100 mL/hr over 30 Minutes Intravenous Every 6 hours 01/16/20 1145 01/17/20 0410    01/16/20 0928   vancomycin (VANCOCIN) powder  Status:  Discontinued         As needed 01/16/20 0928 01/16/20 1036    01/16/20 0800   clindamycin (CLEOCIN) IVPB 600 mg     600 mg 100 mL/hr over 30  Minutes Intravenous To ShortStay Surgical 01/15/20 0851 01/16/20 0907       .   POD/HD#: 63   86 year old female with repeat hardware failure due to severely osteoporotic bone left humerus   -Repeat hardware failure left humerus s/p ROH and splinting              NWB L arm             No shoulder motion at this time             Sling at all times             Ok to move digits              Ice and elevate as able      - Pain management:             Scheduled Tylenol              Minimize narcotics                         Oxycodone 2.5 mg p.o. every 4 hours as needed for severe pain   - ABL anemia/Hemodynamics            monitor              H/H later this pm              May need PRBCs   - Medical issues              Monitor             home meds               Acute delirium                        appears stable                         Will change back to PO meds (haldol and metoprolol)                         Dc cardiac monitoring    - DVT/PE prophylaxis:             No pharmacologics   - ID:              + Multidrug-resistant Klebsiella on previous urine culture             Discussed with ID             Continue precautions              NO ABX if remains asymptomatic             Also dc macrobid at dc                Staph epidermidis on intra-op culture  Will treat with PO doxy x 10 days.  100 mg po q12h                             - Metabolic Bone Disease:             Osteoporosis   - Activity:             Delirium precautions             Up with assistance only   - FEN/GI prophylaxis/Foley/Lines:             dc picc                Dysphagia 3 diet             - Impediments to fracture healing:             Profound osteoporosis             Dementia   - Dispo:           SNF today              Follow up in 10-14 days with ortho trauma      Disposition:  Discharge disposition: 01-Home or Self Care       Discharge  Instructions     Call MD / Call 911   Complete by: As directed    If you experience chest pain or shortness of breath, CALL 911 and be transported to the hospital emergency room.  If you develope a fever above 101 F, pus (white drainage) or increased drainage or redness at the wound, or calf pain, call your surgeon's office.   Constipation Prevention   Complete by: As directed    Drink plenty of fluids.  Prune juice may be helpful.  You may use a stool softener, such as Colace (over the counter) 100 mg twice a day.  Use MiraLax (over the counter) for constipation as needed.   Diet - low sodium heart healthy   Complete by: As directed    Discharge instructions   Complete by: As directed    Orthopaedic Trauma Service Discharge Instructions   General Discharge Instructions  Orthopaedic Injuries:  Left humerus fracture with loss of reduction.  Removal of hardware and splinting   WEIGHT BEARING STATUS: Nonweightbearing left upper extremity   RANGE OF MOTION/ACTIVITY: ok to move fingers on left hand to help with swelling.  No shoulder motion.  Do not remove splint or sling.  Check skin throughout the day to evaluate for areas of pressure   Wound Care: DO NOT REMOVE SPLINT.  Keep sling in place    Diet: as you were eating previously.  Can use over the counter stool softeners and bowel preparations, such as Miralax, to help with bowel movements.  Narcotics can be constipating.  Be sure to drink plenty of fluids  PAIN MEDICATION USE AND EXPECTATIONS  You have likely been given narcotic medications to help control your pain.  After a traumatic event that results in an fracture (broken bone) with or without surgery, it is ok to use narcotic pain medications to help control one's pain.  We understand that everyone responds to pain differently and each individual patient will be evaluated on a regular basis for the continued need for narcotic medications. Ideally, narcotic medication use should last  no more than 6-8 weeks (coinciding with fracture healing).   As a patient  it is your responsibility as well to monitor narcotic medication use and report the amount and frequency you use these medications when you come to your office visit.   We would also advise that if you are using narcotic medications, you should take a dose prior to therapy to maximize you participation.  IF YOU ARE ON NARCOTIC MEDICATIONS IT IS NOT PERMISSIBLE TO OPERATE A MOTOR VEHICLE (MOTORCYCLE/CAR/TRUCK/MOPED) OR HEAVY MACHINERY DO NOT MIX NARCOTICS WITH OTHER CNS (CENTRAL NERVOUS SYSTEM) DEPRESSANTS SUCH AS ALCOHOL   STOP SMOKING OR USING NICOTINE PRODUCTS!!!!  As discussed nicotine severely impairs your body's ability to heal surgical and traumatic wounds but also impairs bone healing.  Wounds and bone heal by forming microscopic blood vessels (angiogenesis) and nicotine is a vasoconstrictor (essentially, shrinks blood vessels).  Therefore, if vasoconstriction occurs to these microscopic blood vessels they essentially disappear and are unable to deliver necessary nutrients to the healing tissue.  This is one modifiable factor that you can do to dramatically increase your chances of healing your injury.    (This means no smoking, no nicotine gum, patches, etc)  DO NOT USE NONSTEROIDAL ANTI-INFLAMMATORY DRUGS (NSAID'S)  Using products such as Advil (ibuprofen), Aleve (naproxen), Motrin (ibuprofen) for additional pain control during fracture healing can delay and/or prevent the healing response.  If you would like to take over the counter (OTC) medication, Tylenol (acetaminophen) is ok.  However, some narcotic medications that are given for pain control contain acetaminophen as well. Therefore, you should not exceed more than 4000 mg of tylenol in a day if you do not have liver disease.  Also note that there are may OTC medicines, such as cold medicines and allergy medicines that my contain tylenol as well.  If you have any  questions about medications and/or interactions please ask your doctor/PA or your pharmacist.      ICE AND ELEVATE INJURED/OPERATIVE EXTREMITY  Using ice and elevating the injured extremity above your heart can help with swelling and pain control.  Icing in a pulsatile fashion, such as 20 minutes on and 20 minutes off, can be followed.    Do not place ice directly on skin. Make sure there is a barrier between to skin and the ice pack.    Using frozen items such as frozen peas works well as the conform nicely to the are that needs to be iced.  USE AN ACE WRAP OR TED HOSE FOR SWELLING CONTROL  In addition to icing and elevation, Ace wraps or TED hose are used to help limit and resolve swelling.  It is recommended to use Ace wraps or TED hose until you are informed to stop.    When using Ace Wraps start the wrapping distally (farthest away from the body) and wrap proximally (closer to the body)   Example: If you had surgery on your leg or thing and you do not have a splint on, start the ace wrap at the toes and work your way up to the thigh        If you had surgery on your upper extremity and do not have a splint on, start the ace wrap at your fingers and work your way up to the upper arm  IF YOU ARE IN A SPLINT OR CAST DO NOT [place]   If your splint gets wet for any reason please contact the office immediately. You may shower in your splint or cast as long as you keep it dry.  This  can be done by wrapping in a cast cover or garbage back (or similar)  Do Not stick any thing down your splint or cast such as pencils, money, or hangers to try and scratch yourself with.  If you feel itchy take benadryl as prescribed on the bottle for itching  IF YOU ARE IN A CAM BOOT (BLACK BOOT)  You may remove boot periodically. Perform daily dressing changes as noted below.  Wash the liner of the boot regularly and wear a sock when wearing the boot. It is recommended that you sleep in the boot until  told otherwise    Call office for the following: Temperature greater than 101F Persistent nausea and vomiting Severe uncontrolled pain Redness, tenderness, or signs of infection (pain, swelling, redness, odor or green/yellow discharge around the site) Difficulty breathing, headache or visual disturbances Hives Persistent dizziness or light-headedness Extreme fatigue Any other questions or concerns you may have after discharge  In an emergency, call 911 or go to an Emergency Department at a nearby hospital    [place]: [phone]   VISIT OUR WEBSITE FOR ADDITIONAL INFORMATION: [email or web address]   Increase activity slowly as tolerated   Complete by: As directed    Lifting restrictions   Complete by: As directed    No lifting   Non weight bearing   Complete by: As directed    Laterality: left   Extremity: Upper      Allergies as of 01/21/2020       Reactions   Clinoril [sulindac] Hives, Swelling, Other (See Comments)   No breathing impairment, however   Darvon [propoxyphene] Hives, Swelling   Levaquin [levofloxacin In D5w] Swelling, Other (See Comments)   No breathing impairment, however   Meropenem Rash   Penicillins Swelling, Rash, Other (See Comments)   Did it involve swelling of the face/tongue/throat, SOB, or low BP? Yes Did it involve sudden or severe rash/hives, skin peeling, or any reaction on the inside of your mouth or nose? Yes Did you need to seek medical attention at a hospital or doctor's office? Yes When did it last happen? Unk     If all above answers are "NO", may proceed with cephalosporin use.   Sulfa Antibiotics Swelling, Other (See Comments)   Also makes the patient delirious- no impairment of breathing   Cefadroxil Other (See Comments)   Reaction not known, but "allergic"   Gentamicin Other (See Comments)   Reaction not known, but "allergic"        Medication List     STOP taking these medications     nitrofurantoin 50 MG capsule Commonly known as: MACRODANTIN   traMADol 50 MG tablet Commonly known as: ULTRAM       TAKE these medications    acetaminophen 650 MG CR tablet Commonly known as: TYLENOL Take 1 tablet (650 mg total) by mouth every 8 (eight) hours. What changed:  when to take this reasons to take this   ascorbic acid 500 MG tablet Commonly known as: VITAMIN C Take 1 tablet (500 mg total) by mouth daily.   aspirin EC 81 MG tablet Take 81 mg by mouth daily. Until follow up   calcium citrate 950 (200 Ca) MG tablet Commonly known as: CALCITRATE - dosed in mg elemental calcium Take 1 tablet (200 mg of elemental calcium total) by mouth 2 (two) times daily.   doxycycline 100 MG tablet Commonly known as: VIBRA-TABS Take 1 tablet (100 mg total) by mouth every  12 (twelve) hours for 10 days.   Ensure Plus Liqd Take 237 mLs by mouth 3 (three) times daily between meals.   haloperidol 0.5 MG tablet Commonly known as: HALDOL Take 0.5 mg by mouth daily as needed for agitation.   levothyroxine 25 MCG tablet Commonly known as: SYNTHROID Take 12.5 mcg by mouth daily.   metoprolol succinate 25 MG 24 hr tablet Commonly known as: TOPROL-XL Take 0.5 tablets (12.5 mg total) by mouth 2 (two) times daily.   mirtazapine 7.5 MG tablet Commonly known as: REMERON Take 7.5 mg by mouth at bedtime.   omeprazole 20 MG capsule Commonly known as: PRILOSEC Take 20 mg by mouth daily.   oxyCODONE 5 MG immediate release tablet Commonly known as: Roxicodone Take 0.5 tablets (2.5 mg total) by mouth every 8 (eight) hours as needed for breakthrough pain.   senna 8.6 MG Tabs tablet Commonly known as: SENOKOT Take 1 tablet by mouth daily as needed for mild constipation.   Vitamin D-3 25 MCG (1000 UT) Caps Take 2,000 Units by mouth 2 (two) times daily.   Vitamin D3 25 MCG tablet Commonly known as: Vitamin D Take 2 tablets (2,000 Units total) by mouth 2 (two) times daily.                 Discharge Care Instructions  (From admission, onward)           Start     Ordered   01/21/20 0000  Non weight bearing    Question Answer Comment  Laterality left   Extremity Upper      01/21/20 1016            Contact information for follow-up providers     Altamese Goshen, MD. Schedule an appointment as soon as possible for a visit in 2 week(s).   Specialty: Orthopedic Surgery Contact information: [place] [address] [phone]              Contact information for after-discharge care     Destination     [facility] .   Service: Skilled Nursing Contact information: [place] [phone]                     Discharge Instructions and Plan:  Patient is a complex issue with respect to her left humerus fracture.  She has already had 2 fixation failures.  There is no reasonable way to achieve stability with any additional hardware at this point and patient will be treated with splinting and fracture bracing.  She will remain in her current splint for the next 2 weeks with transition to fracture bracing thereafter.  No shoulder motion should be performed.  Patient should remain in her sling as much as possible.  Staff can check her soft tissue to make sure that there are no pressure sores.  Is okay for her to move her digits as well to help with swelling control.  We will see the patient back in the office in 2 weeks for follow-up x-rays and conversion to fracture brace.  Please do not remove splint.  If there are any concerns please communicate with the office and await further instructions regarding splint  Signed:  Jari Pigg, PA-C [phone] (C) 01/21/2020, 11:14 AM  [facility] [place] [id number] [phone] Domingo Sep (F)

## 2020-01-21 NOTE — Progress Notes (Signed)
[facility] Progress Note  Patient ID: Shannon Joyce MRN: [id number] DOB/AGE: December 12, 1925 86 y.o.  Subjective:  No acute events overnight Son states pt is picking at splint again Bed offer at [facility]   Repeat covid test is negative (01/20/2020)  Intra-op cultures show staph epi  ROS As a Objective:   VITALS:   Vitals:   01/20/20 0934 01/20/20 2018 01/21/20 0442 01/21/20 0802  BP: 138/65 (!) 141/83 (!) 133/54 138/67  Pulse: 100 (!) 106 87 90  Resp: 16 18  16   Temp: 98.4 F (36.9 C) 98.1 F (36.7 C) 98.7 F (37.1 C) 98.4 F (36.9 C)  TempSrc: Axillary  Oral Oral  SpO2: 94% 96% 97% 97%  Weight:      Height:        Estimated body mass index is 19.75 kg/m as calculated from the following:   Height as of this encounter: 5\' 4"  (1.626 m).   Weight as of this encounter: 52.2 kg.   Intake/Output      02/08 0701 - 02/09 0700 02/09 0701 - 02/10 0700   P.O. 1 240   I.V. (mL/kg)     Total Intake(mL/kg) 1 (0) 240 (4.6)   Urine (mL/kg/hr) 350 (0.3) 400 (1.9)   Total Output 350 400   Net -349 -160          LABS  Results for orders placed or performed during the hospital encounter of 01/14/20 (from the past 24 hour(s))  CBC     Status: Abnormal   Collection Time: 01/20/20 12:30 PM  Result Value Ref Range   WBC 10.8 (H) 4.0 - 10.5 K/uL   RBC 2.61 (L) 3.87 - 5.11 MIL/uL   Hemoglobin 8.8 (L) 12.0 - 15.0 g/dL   HCT 29.2 (L) 36.0 - 46.0 %   MCV 111.9 (H) 80.0 - 100.0 fL   MCH 33.7 26.0 - 34.0 pg   MCHC 30.1 30.0 - 36.0 g/dL   RDW 17.8 (H) 11.5 - 15.5 %   Platelets 164 150 - 400 K/uL   nRBC 0.2 0.0 - 0.2 %  SARS CORONAVIRUS 2 (TAT 6-24 HRS) Nasopharyngeal Nasopharyngeal Swab     Status: None   Collection Time: 01/20/20  5:22 PM   Specimen: Nasopharyngeal Swab  Result Value Ref Range   SARS Coronavirus 2 NEGATIVE NEGATIVE  CBC     Status: Abnormal   Collection Time: 01/21/20   5:00 AM  Result Value Ref Range   WBC 7.5 4.0 - 10.5 K/uL   RBC 2.43 (L) 3.87 - 5.11 MIL/uL   Hemoglobin 8.2 (L) 12.0 - 15.0 g/dL   HCT 27.4 (L) 36.0 - 46.0 %   MCV 112.8 (H) 80.0 - 100.0 fL   MCH 33.7 26.0 - 34.0 pg   MCHC 29.9 (L) 30.0 - 36.0 g/dL   RDW 17.8 (H) 11.5 - 15.5 %   Platelets 144 (L) 150 - 400 K/uL   nRBC 0.0 0.0 - 0.2 %  Renal function panel     Status: Abnormal   Collection Time: 01/21/20  5:00 AM  Result Value Ref Range   Sodium 139 135 - 145 mmol/L   Potassium 4.3 3.5 - 5.1 mmol/L   Chloride 104 98 - 111 mmol/L   CO2 27 22 - 32 mmol/L   Glucose, Bld 111 (H)  70 - 99 mg/dL   BUN 17 8 - 23 mg/dL   Creatinine, Ser 1.50 (H) 0.44 - 1.00 mg/dL   Calcium 8.4 (L) 8.9 - 10.3 mg/dL   Phosphorus 2.7 2.5 - 4.6 mg/dL   Albumin 2.0 (L) 3.5 - 5.0 g/dL   GFR calc non Af Amer 30 (L) >60 mL/min   GFR calc Af Amer 35 (L) >60 mL/min   Anion gap 8 5 - 15     PHYSICAL EXAM:   Gen: sitting in bed side chair, appears comfortabl Lungs: unlabored Cardiac:regular  Ext:       Left Upper Extremity   Splint c/d/i  Ace stable  Swelling to digits stable  Motor functions grossly intact  Ext warm      In an effort to prevent the pt from picking at ace wrap and removing the splint I use a thigh high ted hose and placed it over the ace wrap to prevent the pt from picking at the ace.  It was cut to fit appropriately and was not fitting too tight   Assessment/Plan: 5 Days Post-Op   Principal Problem:   Closed traumatic displaced fracture of shaft of left humerus Active Problems:   Chronic kidney disease   Vitamin D insufficiency   Chronic UTI   Dementia (HCC)   Pathological fracture of humerus due to osteoporosis   Anti-infectives (From admission, onward)   Start     Dose/Rate Route Frequency Ordered Stop   01/21/20 1100  doxycycline (VIBRA-TABS) tablet 100 mg     100 mg Oral Every 12 hours 01/21/20 1054 01/31/20 0959   01/21/20 0000  doxycycline (VIBRA-TABS) 100 MG tablet      100 mg Oral Every 12 hours 01/21/20 1055 01/31/20 2359   01/16/20 1200  clindamycin (CLEOCIN) IVPB 600 mg     600 mg 100 mL/hr over 30 Minutes Intravenous Every 6 hours 01/16/20 1145 01/17/20 0410   01/16/20 0928  vancomycin (VANCOCIN) powder  Status:  Discontinued       As needed 01/16/20 0928 01/16/20 1036   01/16/20 0800  clindamycin (CLEOCIN) IVPB 600 mg     600 mg 100 mL/hr over 30 Minutes Intravenous To [facility] 01/15/20 0851 01/16/20 0907    .  POD/HD#: 23  86 year old female with repeat hardware failure due to severely osteoporotic bone left humerus   -Repeat hardware failure left humerus s/p ROH and splinting              NWB L arm             No shoulder motion at this time             Sling at all times             Ok to move digits              Ice and elevate as able      - Pain management:             Scheduled Tylenol              Minimize narcotics                         Oxycodone 2.5 mg p.o. every 4 hours as needed for severe pain   - ABL anemia/Hemodynamics            monitor  H/H later this pm              May need PRBCs   - Medical issues              Monitor             home meds               Acute delirium                        appears stable                         Will change back to PO meds (haldol and metoprolol)                         Dc cardiac monitoring    - DVT/PE prophylaxis:             No pharmacologics   - ID:              + Multidrug-resistant Klebsiella on previous urine culture             Discussed with ID             Continue precautions              NO ABX if remains asymptomatic             Also dc macrobid at dc    Staph epidermidis on intra-op culture   Will treat with PO doxy x 10 days.  100 mg po q12h                             - Metabolic Bone Disease:             Osteoporosis   - Activity:             Delirium precautions             Up with assistance only   - FEN/GI  prophylaxis/Foley/Lines:             dc picc                Dysphagia 3 diet             - Impediments to fracture healing:             Profound osteoporosis             Dementia   - Dispo:           SNF today   Follow up in 10-14 days with ortho trauma    Jari Pigg, PA-C [phone] (C) 01/21/2020, 11:00 AM  [facility] [place] [address] [phone] Jenetta [phone] (F)   After 6pm on weekdays please call office number to get in touch with on call provider or refer to [place] and look to see who is on call for the [facility] Call [facility] which is listed under orthopaedics   On Weekends please call office number to get in touch with on call provider or refer to [place] and look to see who is on call for the Sports Medicine Call Group which is listed under orthopaedics

## 2020-03-12 DEATH — deceased

## 2021-10-20 IMAGING — RF DG C-ARM 1-60 MIN
1 series · 5 of 5 positions shown · non-contrast
Comparison: Radiographs 01/04/2020, 01/02/2020

CLINICAL DATA: ORIF, left humerus

EXAM:
DG C-ARM 1-60 MIN; LEFT HUMERUS - 2+ VIEW

[Series 1: run · 5 of 5 slices shown]
[im 1/5]
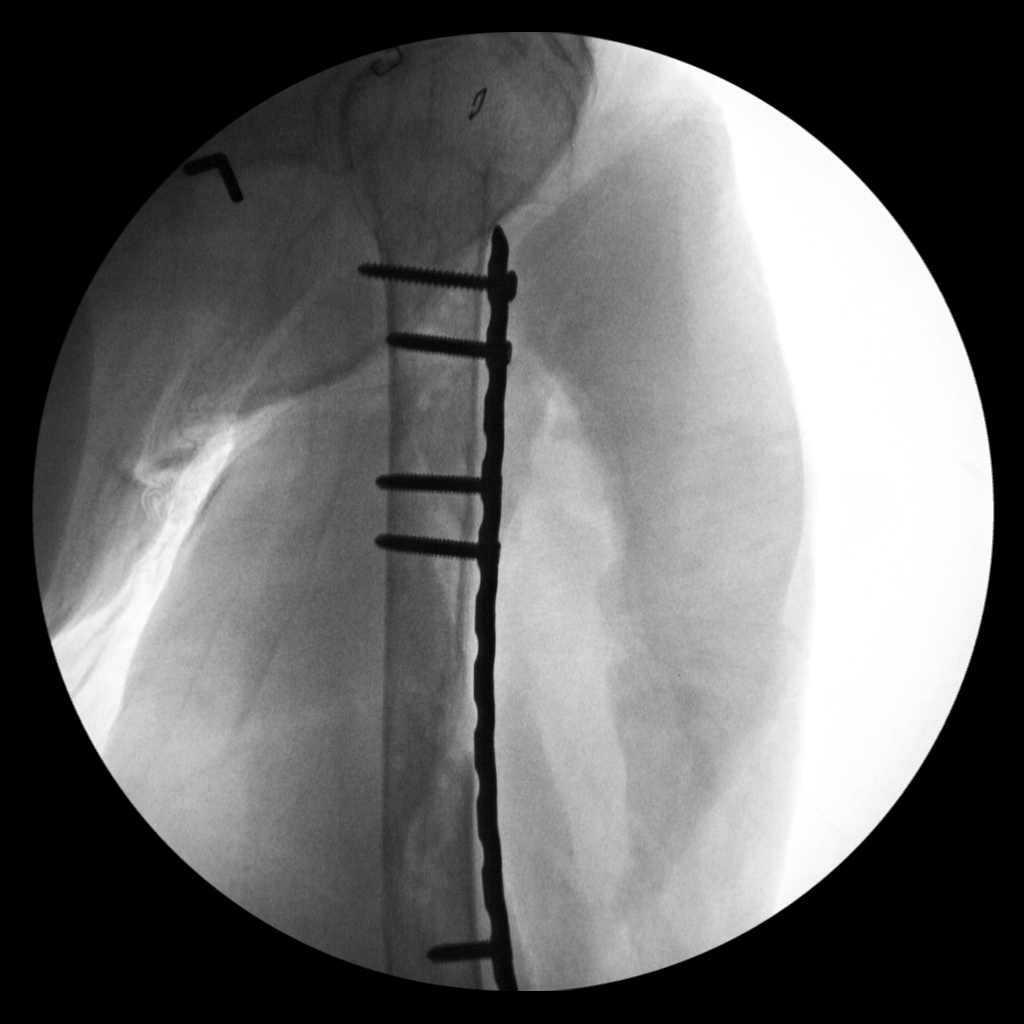
[im 2/5]
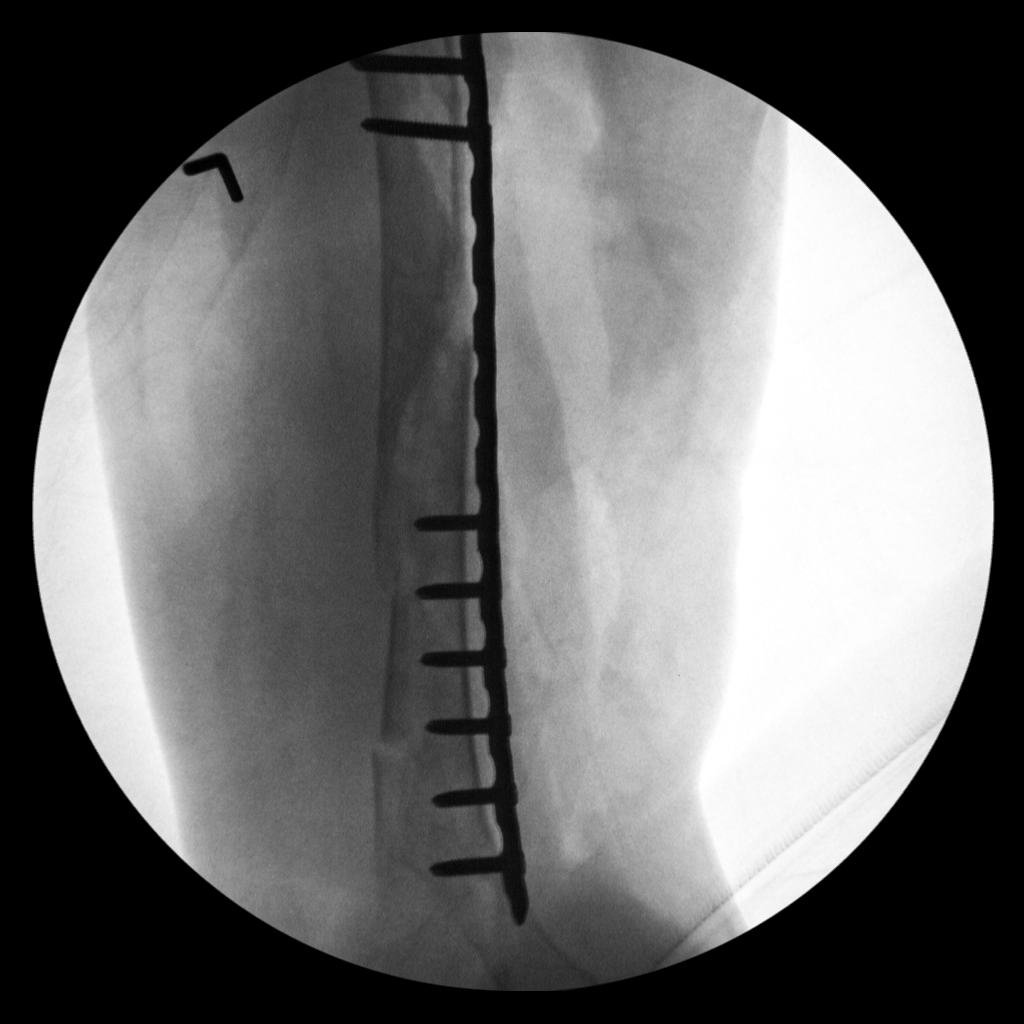
[im 3/5]
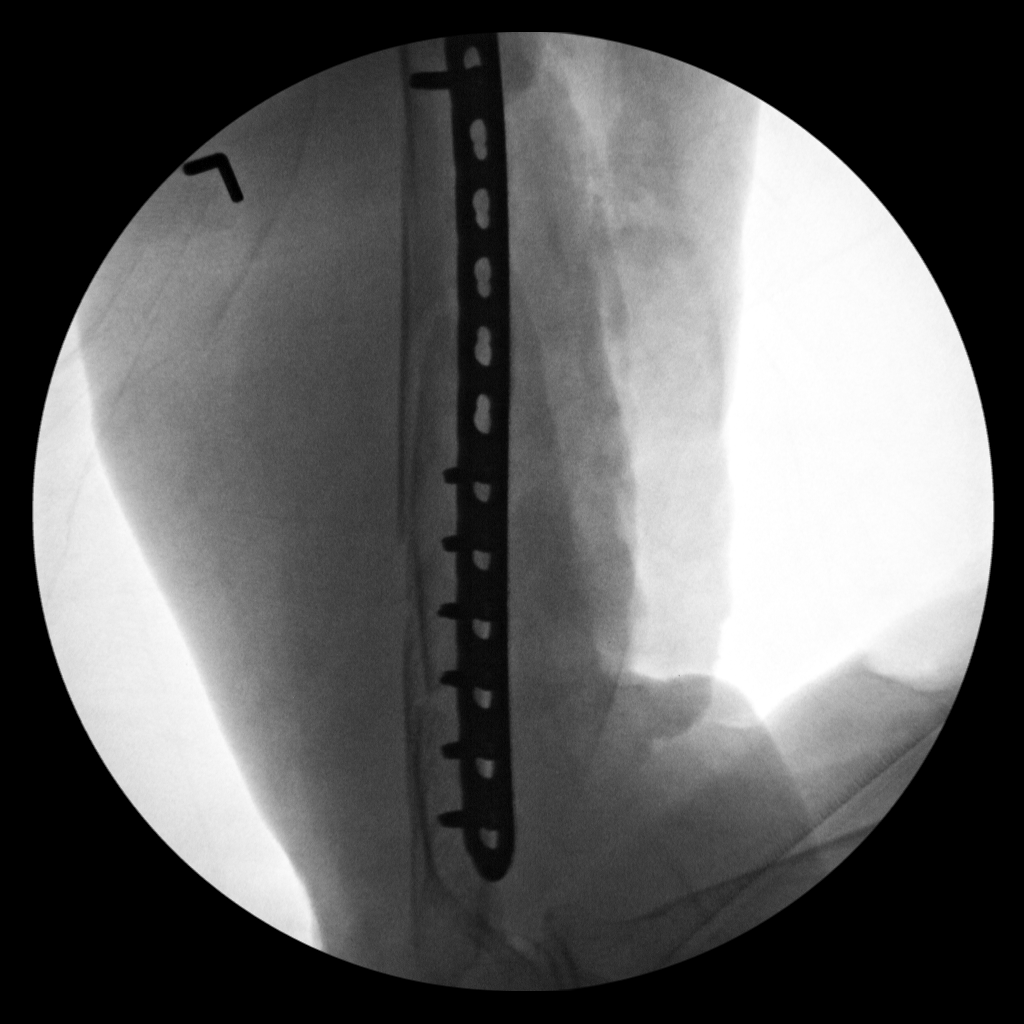
[im 4/5]
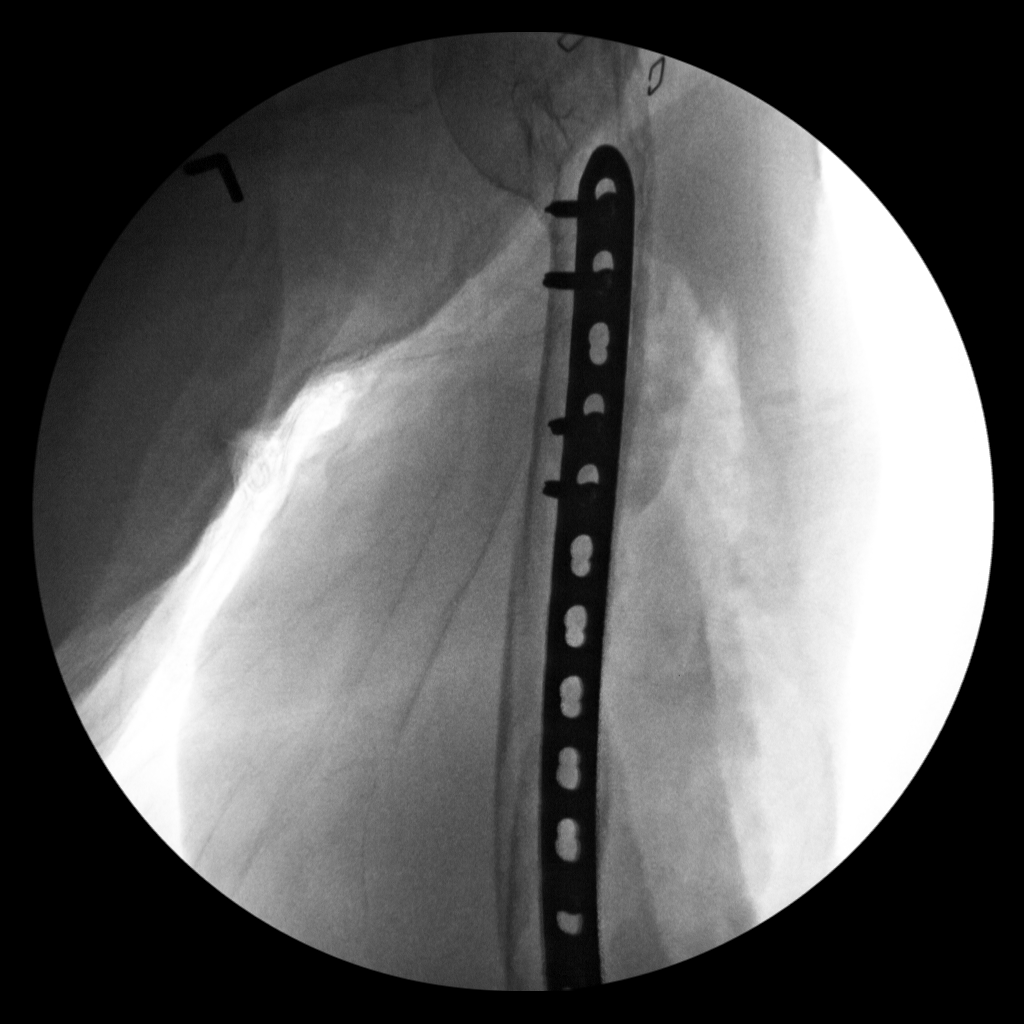
[im 5/5]
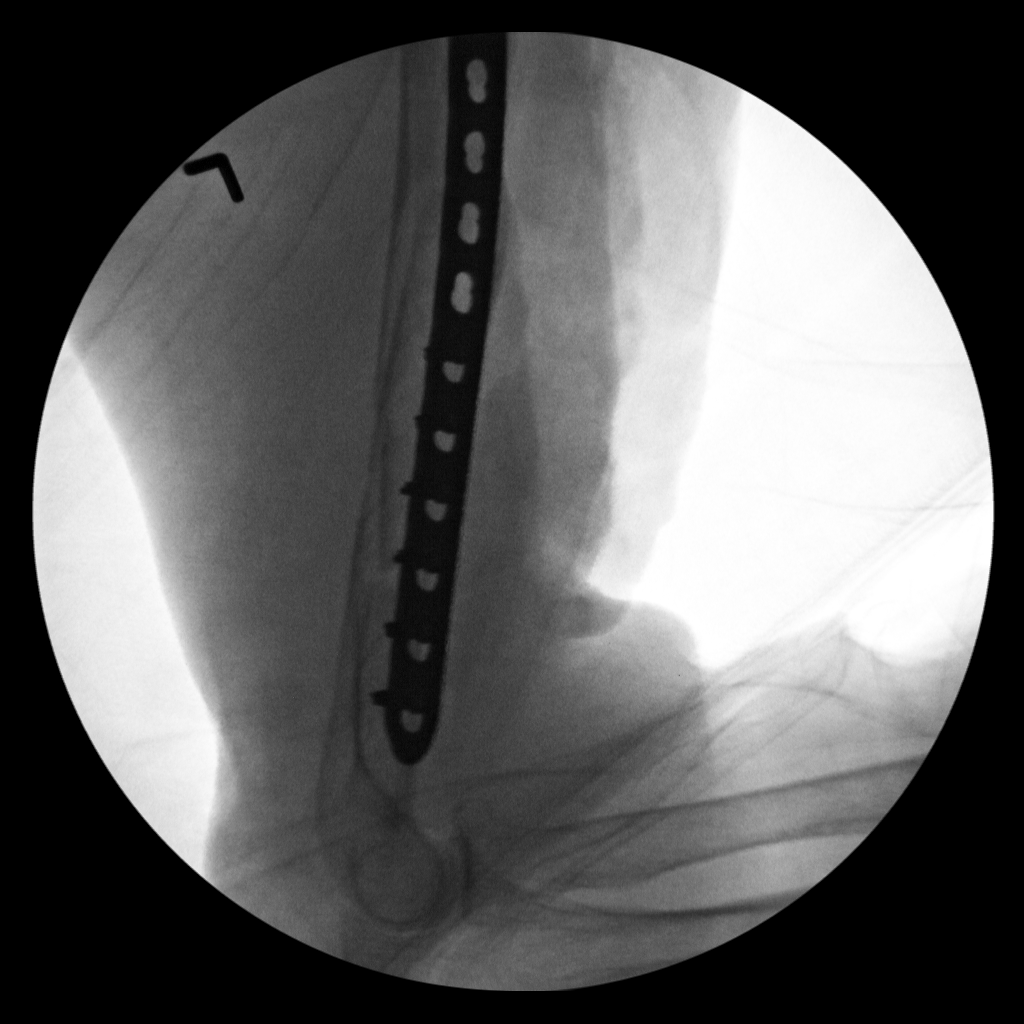

[5 of 5 positions shown; findings below may reference images not displayed]

FINDINGS: Postsurgical changes from revision ORIF of a left humeral fracture
with re-injury identified on same day radiographs. Alignment is much
improved from comparison images performed earlier the same day.
Expected postsurgical soft tissue changes are noted. No acute
hardware failure or complication is seen.
IMPRESSION: Postsurgical changes from ORIF of a left humeral fracture following
re-injury identified on same day radiographs. No acute hardware
complication is evident.
